# Patient Record
Sex: Male | Born: 1950
Health system: Southern US, Community
[De-identification: ages and names within clinical notes are randomized; demographics above are authoritative.]

## PROBLEM LIST (undated history)

## (undated) DIAGNOSIS — J449 Chronic obstructive pulmonary disease, unspecified: Secondary | ICD-10-CM

## (undated) DIAGNOSIS — N400 Enlarged prostate without lower urinary tract symptoms: Secondary | ICD-10-CM

## (undated) DIAGNOSIS — R0600 Dyspnea, unspecified: Secondary | ICD-10-CM

## (undated) DIAGNOSIS — I499 Cardiac arrhythmia, unspecified: Secondary | ICD-10-CM

## (undated) DIAGNOSIS — F32A Depression, unspecified: Secondary | ICD-10-CM

## (undated) DIAGNOSIS — E7800 Pure hypercholesterolemia, unspecified: Secondary | ICD-10-CM

## (undated) DIAGNOSIS — R351 Nocturia: Secondary | ICD-10-CM

## (undated) DIAGNOSIS — H9190 Unspecified hearing loss, unspecified ear: Secondary | ICD-10-CM

## (undated) DIAGNOSIS — N189 Chronic kidney disease, unspecified: Secondary | ICD-10-CM

## (undated) DIAGNOSIS — F329 Major depressive disorder, single episode, unspecified: Secondary | ICD-10-CM

## (undated) DIAGNOSIS — I10 Essential (primary) hypertension: Secondary | ICD-10-CM

## (undated) DIAGNOSIS — C44310 Basal cell carcinoma of skin of unspecified parts of face: Secondary | ICD-10-CM

## (undated) DIAGNOSIS — Z87442 Personal history of urinary calculi: Secondary | ICD-10-CM

## (undated) DIAGNOSIS — R0609 Other forms of dyspnea: Secondary | ICD-10-CM

## (undated) DIAGNOSIS — M1990 Unspecified osteoarthritis, unspecified site: Secondary | ICD-10-CM

## (undated) DIAGNOSIS — E119 Type 2 diabetes mellitus without complications: Secondary | ICD-10-CM

## (undated) DIAGNOSIS — Z8701 Personal history of pneumonia (recurrent): Secondary | ICD-10-CM

## (undated) HISTORY — PX: COLONOSCOPY: SHX174

## (undated) HISTORY — DX: Major depressive disorder, single episode, unspecified: F32.9

## (undated) HISTORY — PX: BASAL CELL CARCINOMA EXCISION: SHX1214

## (undated) HISTORY — DX: Depression, unspecified: F32.A

## (undated) HISTORY — DX: Dyspnea, unspecified: R06.00

## (undated) HISTORY — DX: Pure hypercholesterolemia, unspecified: E78.00

## (undated) HISTORY — DX: Other forms of dyspnea: R06.09

---

## 2004-05-13 ENCOUNTER — Observation Stay (HOSPITAL_COMMUNITY): Admission: EM | Admit: 2004-05-13 | Discharge: 2004-05-14 | Payer: Self-pay | Admitting: Emergency Medicine

## 2004-05-14 HISTORY — PX: CARDIAC CATHETERIZATION: SHX172

## 2012-05-31 HISTORY — PX: BACK SURGERY: SHX140

## 2012-11-21 ENCOUNTER — Other Ambulatory Visit: Payer: Self-pay | Admitting: Neurosurgery

## 2012-12-12 ENCOUNTER — Encounter (HOSPITAL_COMMUNITY): Payer: Self-pay | Admitting: Pharmacy Technician

## 2012-12-13 ENCOUNTER — Encounter (HOSPITAL_COMMUNITY)
Admission: RE | Admit: 2012-12-13 | Discharge: 2012-12-13 | Disposition: A | Discharge status: Home / Self Care | Payer: BC Managed Care – PPO | Source: Ambulatory Visit | Attending: Neurosurgery | Admitting: Neurosurgery

## 2012-12-13 ENCOUNTER — Encounter (HOSPITAL_COMMUNITY): Payer: Self-pay

## 2012-12-13 ENCOUNTER — Encounter (HOSPITAL_COMMUNITY)
Admission: RE | Admit: 2012-12-13 | Discharge: 2012-12-13 | Disposition: A | Discharge status: Home / Self Care | Payer: BC Managed Care – PPO | Source: Ambulatory Visit | Attending: Anesthesiology | Admitting: Anesthesiology

## 2012-12-13 DIAGNOSIS — Z01811 Encounter for preprocedural respiratory examination: Secondary | ICD-10-CM | POA: Insufficient documentation

## 2012-12-13 DIAGNOSIS — Z01812 Encounter for preprocedural laboratory examination: Secondary | ICD-10-CM | POA: Insufficient documentation

## 2012-12-13 DIAGNOSIS — Z01818 Encounter for other preprocedural examination: Secondary | ICD-10-CM | POA: Insufficient documentation

## 2012-12-13 DIAGNOSIS — Z01810 Encounter for preprocedural cardiovascular examination: Secondary | ICD-10-CM | POA: Insufficient documentation

## 2012-12-13 HISTORY — DX: Type 2 diabetes mellitus without complications: E11.9

## 2012-12-13 HISTORY — DX: Chronic obstructive pulmonary disease, unspecified: J44.9

## 2012-12-13 HISTORY — DX: Essential (primary) hypertension: I10

## 2012-12-13 LAB — CBC
HCT: 43.2 % (ref 39.0–52.0)
Hemoglobin: 14.4 g/dL (ref 13.0–17.0)
MCH: 29 pg (ref 26.0–34.0)
MCHC: 33.3 g/dL (ref 30.0–36.0)
MCV: 87.1 fL (ref 78.0–100.0)
RBC: 4.96 MIL/uL (ref 4.22–5.81)
WBC: 9.6 10*3/uL (ref 4.0–10.5)

## 2012-12-13 LAB — TYPE AND SCREEN
ABO/RH(D): O POS
Antibody Screen: NEGATIVE

## 2012-12-13 LAB — BASIC METABOLIC PANEL
BUN: 18 mg/dL (ref 6–23)
CO2: 27 mEq/L (ref 19–32)
Calcium: 9.6 mg/dL (ref 8.4–10.5)
Chloride: 104 mEq/L (ref 96–112)
GFR calc Af Amer: >90 mL/min (ref >90)
GFR calc non Af Amer: 87 mL/min — ABNORMAL LOW (ref >90)
Glucose, Bld: 94 mg/dL (ref 70–99)
Potassium: 4.5 mEq/L (ref 3.5–5.1)
Sodium: 141 mEq/L (ref 135–145)

## 2012-12-13 LAB — SURGICAL PCR SCREEN: Staphylococcus aureus: NEGATIVE

## 2012-12-13 LAB — ABO/RH: ABO/RH(D): O POS

## 2012-12-13 NOTE — Pre-Procedure Instructions (Signed)
Chris Washington  12/13/2012   Your procedure is scheduled on:  December 25, 2012  Report to [facility] at 12 PM.  Call this number if you have problems the morning of surgery: [phone]   Remember:  DISCONTINUE ASPIRIN, COUMADIN, EFFIENT, PLAVIX, HERBAL MEDICATIONS 7 DAYS PRIOR TO SURGERY   Do not eat food or drink liquids after midnight.   Take these medicines the morning of surgery with A SIP OF WATER: amlodipine(norvasc)   Do not wear jewelry, make-up or nail polish.  Do not wear lotions, powders, or perfumes. You may wear deodorant.  Do not shave 48 hours prior to surgery. Men may shave face and neck.  Do not bring valuables to the hospital.  [facility] is not responsible for any belongings or valuables.  Contacts, dentures or bridgework may not be worn into surgery.  Leave suitcase in the car. After surgery it may be brought to your room.  For patients admitted to the hospital, checkout time is 11:00 AM the day of  discharge.   Patients discharged the day of surgery will not be allowed to drive  home.  Name and phone number of your driver:   Special Instructions: Shower using CHG 2 nights before surgery and the night before surgery.  If you shower the day of surgery use CHG.  Use special wash - you have one bottle of CHG for all showers.  You should use approximately 1/3 of the bottle for each shower.   Please read over the following fact sheets that you were given: Pain Booklet, Coughing and Deep Breathing, Blood Transfusion Information and Surgical Site Infection Prevention

## 2012-12-14 ENCOUNTER — Encounter (HOSPITAL_COMMUNITY): Payer: Self-pay

## 2012-12-14 NOTE — Progress Notes (Signed)
Anesthesia chart review:  Patient is a 62 year old male scheduled for L3-4, L4-5 , L5-S1 laminectomies with L4-5 PLIF on 12/25/12 by Dr. Lovell Sheehan.  History includes former smoker, HTN, DM2, COPD.  Cardiac cath on 05/14/04 showed normal coronaries, normal LV systolic function, normal renals, and normal abdominal aorta.  Echo on 05/13/04 showed normal LV systolic function, EF 55-65%, no regional wall motion abnormalities, mildly dilated RV, trivial MR/TR.  (Copies of cath and echo found in E-chart, not Epic.)  EKG on 12/13/12 showed SB @ 54 bpm, anteroseptal infarct (age undetermined).  It was not felt significantly changed since a prior EKG on 05/13/04 (see Muse).  Preoperative CXR and labs noted.  His EKG has been stable since 2005.  No CV symptoms documented at his PAT visit.  If he remains asymptomatic from a CV standpoint then I would anticipate that he could proceed as planned.  Velna Ochs [facility]/Anesthesiology Phone [phone] 12/14/2012 1:17 PM

## 2012-12-24 MED ORDER — CEFAZOLIN SODIUM-DEXTROSE 2-3 GM-% IV SOLR
2.0000 g | INTRAVENOUS | Status: AC
  Administered 2012-12-25: 2 g via INTRAVENOUS
  Filled 2012-12-24: qty 50

## 2012-12-25 ENCOUNTER — Inpatient Hospital Stay (HOSPITAL_COMMUNITY)
Admission: RE | Admit: 2012-12-25 | Discharge: 2012-12-29 | DRG: 755 | Disposition: A | Discharge status: Home / Self Care | Payer: BC Managed Care – PPO | Source: Ambulatory Visit | Attending: Neurosurgery | Admitting: Neurosurgery

## 2012-12-25 ENCOUNTER — Encounter (HOSPITAL_COMMUNITY): Payer: Self-pay | Admitting: Vascular Surgery

## 2012-12-25 ENCOUNTER — Encounter (HOSPITAL_COMMUNITY): Payer: Self-pay | Admitting: *Deleted

## 2012-12-25 ENCOUNTER — Encounter (HOSPITAL_COMMUNITY)
Admission: RE | Disposition: A | Discharge status: Home / Self Care | Payer: Self-pay | Source: Ambulatory Visit | Attending: Neurosurgery

## 2012-12-25 ENCOUNTER — Inpatient Hospital Stay (HOSPITAL_COMMUNITY): Payer: BC Managed Care – PPO | Admitting: Certified Registered"

## 2012-12-25 ENCOUNTER — Inpatient Hospital Stay (HOSPITAL_COMMUNITY): Payer: BC Managed Care – PPO

## 2012-12-25 DIAGNOSIS — I10 Essential (primary) hypertension: Secondary | ICD-10-CM | POA: Diagnosis present

## 2012-12-25 DIAGNOSIS — M48062 Spinal stenosis, lumbar region with neurogenic claudication: Principal | ICD-10-CM | POA: Diagnosis present

## 2012-12-25 DIAGNOSIS — J4489 Other specified chronic obstructive pulmonary disease: Secondary | ICD-10-CM | POA: Diagnosis present

## 2012-12-25 DIAGNOSIS — M4316 Spondylolisthesis, lumbar region: Secondary | ICD-10-CM

## 2012-12-25 DIAGNOSIS — E119 Type 2 diabetes mellitus without complications: Secondary | ICD-10-CM | POA: Diagnosis present

## 2012-12-25 DIAGNOSIS — J449 Chronic obstructive pulmonary disease, unspecified: Secondary | ICD-10-CM | POA: Diagnosis present

## 2012-12-25 DIAGNOSIS — R339 Retention of urine, unspecified: Secondary | ICD-10-CM | POA: Diagnosis not present

## 2012-12-25 DIAGNOSIS — Z87891 Personal history of nicotine dependence: Secondary | ICD-10-CM

## 2012-12-25 LAB — GLUCOSE, CAPILLARY
Glucose-Capillary: 92 mg/dL (ref 70–99)
Glucose-Capillary: 161 mg/dL — ABNORMAL HIGH (ref 70–99)

## 2012-12-25 SURGERY — POSTERIOR LUMBAR FUSION 1 LEVEL
Anesthesia: General | Wound class: Clean

## 2012-12-25 MED ORDER — ONDANSETRON HCL 4 MG/2ML IJ SOLN
INTRAMUSCULAR | Status: DC | PRN
  Administered 2012-12-25: 4 mg via INTRAVENOUS

## 2012-12-25 MED ORDER — LIDOCAINE HCL (CARDIAC) 20 MG/ML IV SOLN
INTRAVENOUS | Status: DC | PRN
  Administered 2012-12-25: 80 mg via INTRAVENOUS

## 2012-12-25 MED ORDER — NEOSTIGMINE METHYLSULFATE 1 MG/ML IJ SOLN
INTRAMUSCULAR | Status: DC | PRN
  Administered 2012-12-25: 4 mg via INTRAVENOUS

## 2012-12-25 MED ORDER — PHENOL 1.4 % MT LIQD: 1.0000 | OROMUCOSAL | Status: DC | PRN

## 2012-12-25 MED ORDER — SODIUM CHLORIDE 0.9 % IV SOLN
INTRAVENOUS | Status: AC
  Filled 2012-12-25: qty 500

## 2012-12-25 MED ORDER — INSULIN ASPART 100 UNIT/ML ~~LOC~~ SOLN
0.0000 [IU] | SUBCUTANEOUS | Status: DC
  Administered 2012-12-25: 4 [IU] via SUBCUTANEOUS
  Administered 2012-12-26: 1 [IU] via SUBCUTANEOUS
  Administered 2012-12-26 (×2): 3 [IU] via SUBCUTANEOUS
  Administered 2012-12-26 – 2012-12-27 (×2): 4 [IU] via SUBCUTANEOUS
  Administered 2012-12-27: 3 [IU] via SUBCUTANEOUS
  Administered 2012-12-27: 4 [IU] via SUBCUTANEOUS
  Administered 2012-12-27 – 2012-12-28 (×4): 3 [IU] via SUBCUTANEOUS
  Administered 2012-12-28 – 2012-12-29 (×4): 4 [IU] via SUBCUTANEOUS

## 2012-12-25 MED ORDER — LACTATED RINGERS IV SOLN
INTRAVENOUS | Status: DC | PRN
  Administered 2012-12-25 (×2): via INTRAVENOUS

## 2012-12-25 MED ORDER — DIPHENHYDRAMINE HCL 12.5 MG/5ML PO ELIX: 12.5000 mg | ORAL_SOLUTION | Freq: Four times a day (QID) | ORAL | Status: DC | PRN

## 2012-12-25 MED ORDER — OXYCODONE-ACETAMINOPHEN 5-325 MG PO TABS
1.0000 | ORAL_TABLET | ORAL | Status: DC | PRN
  Administered 2012-12-27 – 2012-12-29 (×8): 2 via ORAL
  Filled 2012-12-25 (×9): qty 2

## 2012-12-25 MED ORDER — MORPHINE SULFATE (PF) 1 MG/ML IV SOLN
INTRAVENOUS | Status: AC
  Filled 2012-12-25: qty 25

## 2012-12-25 MED ORDER — MENTHOL 3 MG MT LOZG: 1.0000 | LOZENGE | OROMUCOSAL | Status: DC | PRN

## 2012-12-25 MED ORDER — ONDANSETRON HCL 4 MG/2ML IJ SOLN
4.0000 mg | Freq: Four times a day (QID) | INTRAMUSCULAR | Status: DC | PRN
  Administered 2012-12-25: 4 mg via INTRAVENOUS

## 2012-12-25 MED ORDER — MORPHINE SULFATE (PF) 1 MG/ML IV SOLN
INTRAVENOUS | Status: DC
  Administered 2012-12-25 – 2012-12-26 (×2): 4.5 mg via INTRAVENOUS
  Administered 2012-12-26: 6 mg via INTRAVENOUS

## 2012-12-25 MED ORDER — ONDANSETRON HCL 4 MG/2ML IJ SOLN
4.0000 mg | INTRAMUSCULAR | Status: DC | PRN
  Administered 2012-12-26 – 2012-12-28 (×2): 4 mg via INTRAVENOUS
  Filled 2012-12-25 (×2): qty 2

## 2012-12-25 MED ORDER — LISINOPRIL 20 MG PO TABS
20.0000 mg | ORAL_TABLET | Freq: Every day | ORAL | Status: DC
  Administered 2012-12-26 – 2012-12-29 (×4): 20 mg via ORAL
  Filled 2012-12-25 (×4): qty 1

## 2012-12-25 MED ORDER — FENTANYL CITRATE 0.05 MG/ML IJ SOLN
INTRAMUSCULAR | Status: DC | PRN
  Administered 2012-12-25: 100 ug via INTRAVENOUS
  Administered 2012-12-25 (×4): 50 ug via INTRAVENOUS
  Administered 2012-12-25: 100 ug via INTRAVENOUS
  Administered 2012-12-25 (×2): 50 ug via INTRAVENOUS

## 2012-12-25 MED ORDER — METFORMIN HCL ER 500 MG PO TB24
1000.0000 mg | ORAL_TABLET | Freq: Every evening | ORAL | Status: DC
  Administered 2012-12-25 – 2012-12-28 (×4): 1000 mg via ORAL
  Filled 2012-12-25 (×5): qty 2

## 2012-12-25 MED ORDER — LACTATED RINGERS IV SOLN
INTRAVENOUS | Status: DC
  Administered 2012-12-25 – 2012-12-27 (×2): via INTRAVENOUS

## 2012-12-25 MED ORDER — MORPHINE SULFATE 2 MG/ML IJ SOLN: 1.0000 mg | INTRAMUSCULAR | Status: DC | PRN

## 2012-12-25 MED ORDER — HYDROCODONE-ACETAMINOPHEN 5-325 MG PO TABS: 1.0000 | ORAL_TABLET | ORAL | Status: DC | PRN

## 2012-12-25 MED ORDER — SODIUM CHLORIDE 0.9 % IR SOLN
Status: DC | PRN
  Administered 2012-12-25: 13:00:00

## 2012-12-25 MED ORDER — CEFAZOLIN SODIUM-DEXTROSE 2-3 GM-% IV SOLR
2.0000 g | Freq: Three times a day (TID) | INTRAVENOUS | Status: AC
  Administered 2012-12-25 – 2012-12-26 (×2): 2 g via INTRAVENOUS
  Filled 2012-12-25 (×2): qty 50

## 2012-12-25 MED ORDER — BACITRACIN ZINC 500 UNIT/GM EX OINT
TOPICAL_OINTMENT | CUTANEOUS | Status: DC | PRN
  Administered 2012-12-25: 1 via TOPICAL

## 2012-12-25 MED ORDER — ACETAMINOPHEN 325 MG PO TABS
650.0000 mg | ORAL_TABLET | ORAL | Status: DC | PRN
  Administered 2012-12-27: 650 mg via ORAL
  Filled 2012-12-25: qty 2

## 2012-12-25 MED ORDER — BUDESONIDE-FORMOTEROL FUMARATE 160-4.5 MCG/ACT IN AERO
2.0000 | INHALATION_SPRAY | Freq: Two times a day (BID) | RESPIRATORY_TRACT | Status: DC | PRN
  Filled 2012-12-25 (×2): qty 6

## 2012-12-25 MED ORDER — NALOXONE HCL 0.4 MG/ML IJ SOLN: 0.4000 mg | INTRAMUSCULAR | Status: DC | PRN

## 2012-12-25 MED ORDER — BUPIVACAINE LIPOSOME 1.3 % IJ SUSP
INTRAMUSCULAR | Status: DC | PRN
  Administered 2012-12-25: 20 mL

## 2012-12-25 MED ORDER — ROCURONIUM BROMIDE 100 MG/10ML IV SOLN
INTRAVENOUS | Status: DC | PRN
  Administered 2012-12-25: 50 mg via INTRAVENOUS
  Administered 2012-12-25 (×4): 10 mg via INTRAVENOUS

## 2012-12-25 MED ORDER — GLYCOPYRROLATE 0.2 MG/ML IJ SOLN
INTRAMUSCULAR | Status: DC | PRN
  Administered 2012-12-25: 0.6 mg via INTRAVENOUS
  Administered 2012-12-25: 0.1 mg via INTRAVENOUS

## 2012-12-25 MED ORDER — HYDROMORPHONE HCL PF 1 MG/ML IJ SOLN
INTRAMUSCULAR | Status: AC
  Filled 2012-12-25: qty 1

## 2012-12-25 MED ORDER — SODIUM CHLORIDE 0.9 % IJ SOLN: 9.0000 mL | INTRAMUSCULAR | Status: DC | PRN

## 2012-12-25 MED ORDER — DEXAMETHASONE SODIUM PHOSPHATE 10 MG/ML IJ SOLN
INTRAMUSCULAR | Status: DC | PRN
  Administered 2012-12-25: 10 mg via INTRAVENOUS

## 2012-12-25 MED ORDER — BACITRACIN 50000 UNITS IM SOLR
INTRAMUSCULAR | Status: AC
  Filled 2012-12-25: qty 1

## 2012-12-25 MED ORDER — HYDROMORPHONE HCL PF 1 MG/ML IJ SOLN
INTRAMUSCULAR | Status: AC
  Administered 2012-12-25: 0.5 mg via INTRAVENOUS
  Filled 2012-12-25: qty 1

## 2012-12-25 MED ORDER — MIDAZOLAM HCL 5 MG/5ML IJ SOLN
INTRAMUSCULAR | Status: DC | PRN
  Administered 2012-12-25: 2 mg via INTRAVENOUS

## 2012-12-25 MED ORDER — OXYCODONE HCL 5 MG/5ML PO SOLN: 5.0000 mg | Freq: Once | ORAL | Status: DC | PRN

## 2012-12-25 MED ORDER — AMLODIPINE BESYLATE 5 MG PO TABS
5.0000 mg | ORAL_TABLET | Freq: Every day | ORAL | Status: DC
  Administered 2012-12-26 – 2012-12-29 (×4): 5 mg via ORAL
  Filled 2012-12-25 (×4): qty 1

## 2012-12-25 MED ORDER — BUPIVACAINE-EPINEPHRINE PF 0.5-1:200000 % IJ SOLN
INTRAMUSCULAR | Status: DC | PRN
  Administered 2012-12-25: 10 mL

## 2012-12-25 MED ORDER — THROMBIN 20000 UNITS EX SOLR
CUTANEOUS | Status: DC | PRN
  Administered 2012-12-25: 13:00:00 via TOPICAL

## 2012-12-25 MED ORDER — DIPHENHYDRAMINE HCL 50 MG/ML IJ SOLN: 12.5000 mg | Freq: Four times a day (QID) | INTRAMUSCULAR | Status: DC | PRN

## 2012-12-25 MED ORDER — OXYCODONE HCL 5 MG PO TABS: 5.0000 mg | ORAL_TABLET | Freq: Once | ORAL | Status: DC | PRN

## 2012-12-25 MED ORDER — PROMETHAZINE HCL 25 MG/ML IJ SOLN
INTRAMUSCULAR | Status: AC
  Administered 2012-12-25: 6.25 mg via INTRAVENOUS
  Filled 2012-12-25: qty 1

## 2012-12-25 MED ORDER — ACETAMINOPHEN 650 MG RE SUPP: 650.0000 mg | RECTAL | Status: DC | PRN

## 2012-12-25 MED ORDER — PHENYLEPHRINE HCL 10 MG/ML IJ SOLN
10.0000 mg | INTRAVENOUS | Status: DC | PRN
  Administered 2012-12-25: 25 ug/min via INTRAVENOUS

## 2012-12-25 MED ORDER — DIAZEPAM 5 MG PO TABS
5.0000 mg | ORAL_TABLET | Freq: Four times a day (QID) | ORAL | Status: DC | PRN
  Administered 2012-12-27: 5 mg via ORAL
  Filled 2012-12-25: qty 1

## 2012-12-25 MED ORDER — PROMETHAZINE HCL 25 MG/ML IJ SOLN: 6.2500 mg | INTRAMUSCULAR | Status: DC | PRN

## 2012-12-25 MED ORDER — ONDANSETRON HCL 4 MG/2ML IJ SOLN
INTRAMUSCULAR | Status: AC
  Filled 2012-12-25: qty 2

## 2012-12-25 MED ORDER — DOCUSATE SODIUM 100 MG PO CAPS
100.0000 mg | ORAL_CAPSULE | Freq: Two times a day (BID) | ORAL | Status: DC
  Administered 2012-12-25 – 2012-12-29 (×8): 100 mg via ORAL
  Filled 2012-12-25 (×5): qty 1

## 2012-12-25 MED ORDER — ALUM & MAG HYDROXIDE-SIMETH 200-200-20 MG/5ML PO SUSP
30.0000 mL | Freq: Four times a day (QID) | ORAL | Status: DC | PRN
  Administered 2012-12-25 – 2012-12-28 (×4): 30 mL via ORAL
  Filled 2012-12-25 (×4): qty 30

## 2012-12-25 MED ORDER — 0.9 % SODIUM CHLORIDE (POUR BTL) OPTIME
TOPICAL | Status: DC | PRN
  Administered 2012-12-25: 1000 mL

## 2012-12-25 MED ORDER — BUPIVACAINE LIPOSOME 1.3 % IJ SUSP
20.0000 mL | Freq: Once | INTRAMUSCULAR | Status: DC
  Filled 2012-12-25: qty 20

## 2012-12-25 MED ORDER — LACTATED RINGERS IV SOLN
INTRAVENOUS | Status: DC
  Administered 2012-12-25: 08:00:00 via INTRAVENOUS

## 2012-12-25 MED ORDER — PROPOFOL 10 MG/ML IV BOLUS
INTRAVENOUS | Status: DC | PRN
  Administered 2012-12-25: 200 mg via INTRAVENOUS

## 2012-12-25 MED ORDER — HYDROMORPHONE HCL PF 1 MG/ML IJ SOLN
0.2500 mg | INTRAMUSCULAR | Status: DC | PRN
  Administered 2012-12-25 (×2): 0.5 mg via INTRAVENOUS

## 2012-12-25 MED ORDER — ARTIFICIAL TEARS OP OINT
TOPICAL_OINTMENT | OPHTHALMIC | Status: DC | PRN
  Administered 2012-12-25: 1 via OPHTHALMIC

## 2012-12-25 SURGICAL SUPPLY — 67 items
BAG DECANTER FOR FLEXI CONT (MISCELLANEOUS) ×2 IMPLANT
BENZOIN TINCTURE PRP APPL 2/3 (GAUZE/BANDAGES/DRESSINGS) ×2 IMPLANT
BLADE SURG ROTATE 9660 (MISCELLANEOUS) IMPLANT
BRUSH SCRUB EZ PLAIN DRY (MISCELLANEOUS) ×2 IMPLANT
BUR ACORN 6.0 (BURR) ×2 IMPLANT
BUR MATCHSTICK NEURO 3.0 LAGG (BURR) ×2 IMPLANT
CANISTER SUCTION 2500CC (MISCELLANEOUS) ×2 IMPLANT
CAP REVERE LOCKING (Cap) ×8 IMPLANT
CLOTH BEACON ORANGE TIMEOUT ST (SAFETY) ×2 IMPLANT
CONT SPEC 4OZ CLIKSEAL STRL BL (MISCELLANEOUS) ×2 IMPLANT
COVER BACK TABLE 24X17X13 BIG (DRAPES) IMPLANT
COVER TABLE BACK 60X90 (DRAPES) ×2 IMPLANT
DRAPE C-ARM 42X72 X-RAY (DRAPES) ×6 IMPLANT
DRAPE LAPAROTOMY 100X72X124 (DRAPES) ×2 IMPLANT
DRAPE POUCH INSTRU U-SHP 10X18 (DRAPES) ×2 IMPLANT
DRAPE PROXIMA HALF (DRAPES) ×2 IMPLANT
DRAPE SURG 17X23 STRL (DRAPES) ×8 IMPLANT
ELECT BLADE 4.0 EZ CLEAN MEGAD (MISCELLANEOUS) ×2
ELECT REM PT RETURN 9FT ADLT (ELECTROSURGICAL) ×2
ELECTRODE BLDE 4.0 EZ CLN MEGD (MISCELLANEOUS) ×1 IMPLANT
ELECTRODE REM PT RTRN 9FT ADLT (ELECTROSURGICAL) ×1 IMPLANT
EVACUATOR 1/8 PVC DRAIN (DRAIN) ×2 IMPLANT
GAUZE SPONGE 4X4 16PLY XRAY LF (GAUZE/BANDAGES/DRESSINGS) ×2 IMPLANT
GLOVE BIO SURGEON STRL SZ 6.5 (GLOVE) ×4 IMPLANT
GLOVE BIO SURGEON STRL SZ8.5 (GLOVE) ×4 IMPLANT
GLOVE BIOGEL PI IND STRL 6.5 (GLOVE) ×1 IMPLANT
GLOVE BIOGEL PI INDICATOR 6.5 (GLOVE) ×1
GLOVE ECLIPSE 8.5 STRL (GLOVE) ×2 IMPLANT
GLOVE EXAM NITRILE LRG STRL (GLOVE) IMPLANT
GLOVE EXAM NITRILE MD LF STRL (GLOVE) ×4 IMPLANT
GLOVE EXAM NITRILE XL STR (GLOVE) IMPLANT
GLOVE EXAM NITRILE XS STR PU (GLOVE) IMPLANT
GLOVE SS BIOGEL STRL SZ 8 (GLOVE) ×2 IMPLANT
GLOVE SUPERSENSE BIOGEL SZ 8 (GLOVE) ×2
GOWN BRE IMP SLV AUR LG STRL (GOWN DISPOSABLE) IMPLANT
GOWN BRE IMP SLV AUR XL STRL (GOWN DISPOSABLE) ×6 IMPLANT
GOWN STRL REIN 2XL LVL4 (GOWN DISPOSABLE) ×2 IMPLANT
KIT BASIN OR (CUSTOM PROCEDURE TRAY) ×2 IMPLANT
KIT ROOM TURNOVER OR (KITS) ×2 IMPLANT
MILL MEDIUM DISP (BLADE) ×2 IMPLANT
NEEDLE HYPO 21X1.5 SAFETY (NEEDLE) ×2 IMPLANT
NEEDLE HYPO 22GX1.5 SAFETY (NEEDLE) ×2 IMPLANT
NS IRRIG 1000ML POUR BTL (IV SOLUTION) ×2 IMPLANT
PACK FOAM VITOSS 10CC (Orthopedic Implant) ×2 IMPLANT
PACK LAMINECTOMY NEURO (CUSTOM PROCEDURE TRAY) ×2 IMPLANT
PAD ARMBOARD 7.5X6 YLW CONV (MISCELLANEOUS) ×6 IMPLANT
PATTIES SURGICAL .5 X1 (DISPOSABLE) IMPLANT
PUTTY 10ML ACTIFUSE ABX (Putty) ×2 IMPLANT
ROD REVERE 6.35 45MM (Rod) ×4 IMPLANT
SCREW REVERE 6.35 75X55MM (Screw) ×8 IMPLANT
SLEEVE SURGEON STRL (DRAPES) ×2 IMPLANT
SPACER SUSTAIN O 10X26 13MM (Spacer) ×4 IMPLANT
SPONGE GAUZE 4X4 12PLY (GAUZE/BANDAGES/DRESSINGS) ×2 IMPLANT
SPONGE LAP 4X18 X RAY DECT (DISPOSABLE) IMPLANT
SPONGE NEURO XRAY DETECT 1X3 (DISPOSABLE) IMPLANT
SPONGE SURGIFOAM ABS GEL 100 (HEMOSTASIS) ×2 IMPLANT
STRIP CLOSURE SKIN 1/2X4 (GAUZE/BANDAGES/DRESSINGS) ×2 IMPLANT
SUT VIC AB 1 CT1 18XBRD ANBCTR (SUTURE) ×3 IMPLANT
SUT VIC AB 1 CT1 8-18 (SUTURE) ×3
SUT VIC AB 2-0 CP2 18 (SUTURE) ×4 IMPLANT
SYR 20CC LL (SYRINGE) ×2 IMPLANT
SYR 20ML ECCENTRIC (SYRINGE) ×2 IMPLANT
TAPE CLOTH SURG 4X10 WHT LF (GAUZE/BANDAGES/DRESSINGS) ×2 IMPLANT
TOWEL OR 17X24 6PK STRL BLUE (TOWEL DISPOSABLE) ×2 IMPLANT
TOWEL OR 17X26 10 PK STRL BLUE (TOWEL DISPOSABLE) ×2 IMPLANT
TRAY FOLEY CATH 14FRSI W/METER (CATHETERS) ×2 IMPLANT
WATER STERILE IRR 1000ML POUR (IV SOLUTION) ×2 IMPLANT

## 2012-12-25 NOTE — H&P (Signed)
Subjective: The patient is a 62 year old white male who has complained of back and bilateral leg pain. He has failed medical management and was worked up with a lumbar MRI. This demonstrated multilevel spinal stenosis as well as a mobile spondylolisthesis at L4-5. I discussed the various treatment options with the patient including surgery. He has weighed the risks, benefits, and alternatives surgery and decided proceed with a lumbar decompression instrumentation and fusion.   Past Medical History  Diagnosis Date  . Hypertension   . Diabetes mellitus without complication   . COPD (chronic obstructive pulmonary disease)     Past Surgical History  Procedure Laterality Date  . Cardiac catheterization  05/14/04    normal coronaries, LV systolic function, renals, abdominal aorta (Dr. Chanda Busing, [facility])    No Known Allergies  History  Substance Use Topics  . Smoking status: Former Smoker    Quit date: 05/31/2004  . Smokeless tobacco: Not on file  . Alcohol Use: No    History reviewed. No pertinent family history. Prior to Admission medications   Medication Sig Start Date End Date Taking? Authorizing Provider  amLODipine (NORVASC) 5 MG tablet Take 5 mg by mouth daily.   Yes Historical Provider, MD  budesonide-formoterol (SYMBICORT) 160-4.5 MCG/ACT inhaler Inhale 2 puffs into the lungs 2 (two) times daily as needed.   Yes Historical Provider, MD  calcium carbonate (TUMS - DOSED IN MG ELEMENTAL CALCIUM) 500 MG chewable tablet Chew 1 tablet by mouth 3 (three) times daily as needed for heartburn.   Yes Historical Provider, MD  etodolac (LODINE) 400 MG tablet Take 400 mg by mouth 2 (two) times daily as needed (for pain).   Yes Historical Provider, MD  lisinopril (PRINIVIL,ZESTRIL) 20 MG tablet Take 20 mg by mouth daily.   Yes Historical Provider, MD  metFORMIN (GLUCOPHAGE-XR) 500 MG 24 hr tablet Take 1,000 mg by mouth every evening.   Yes Historical Provider, MD     Review of  Systems  Positive ROS: As above  All other systems have been reviewed and were otherwise negative with the exception of those mentioned in the HPI and as above.  Objective: Vital signs in last 24 hours: Temp:  [97.3 F (36.3 C)] 97.3 F (36.3 C) (07/28 0753) Pulse Rate:  [62] 62 (07/28 0753) Resp:  [20] 20 (07/28 0753) BP: (175)/(82) 175/82 mmHg (07/28 0753) SpO2:  [98 %] 98 % (07/28 0753)  General Appearance: Alert, cooperative, no distress, appears stated age Head: Normocephalic, without obvious abnormality, atraumatic Eyes: PERRL, conjunctiva/corneas clear, EOM's intact, fundi benign, both eyes      Ears: Normal TM's and external ear canals, both ears Throat: Lips, mucosa, and tongue normal; teeth and gums normal Neck: Supple, symmetrical, trachea midline, no adenopathy; thyroid: No enlargement/tenderness/nodules; no carotid bruit or JVD Back: Symmetric, no curvature, ROM normal, no CVA tenderness Lungs: Clear to auscultation bilaterally, respirations unlabored Heart: Regular rate and rhythm, S1 and S2 normal, no murmur, rub or gallop Abdomen: Soft, non-tender, bowel sounds active all four quadrants, no masses, no organomegaly Extremities: Extremities normal, atraumatic, no cyanosis or edema Pulses: 2+ and symmetric all extremities Skin: Skin color, texture, turgor normal, no rashes or lesions  NEUROLOGIC:   Mental status: alert and oriented, no aphasia, good attention span, Fund of knowledge/ memory ok Motor Exam - grossly normal Sensory Exam - grossly normal Reflexes:  Coordination - grossly normal Gait - grossly normal Balance - grossly normal Cranial Nerves: I: smell Not tested  II: visual acuity  OS:  Normal    OD: Normal   II: visual fields Full to confrontation  II: pupils Equal, round, reactive to light  III,VII: ptosis None  III,IV,VI: extraocular muscles  Full ROM  V: mastication Normal  V: facial light touch sensation  Normal  V,VII: corneal reflex   Present  VII: facial muscle function - upper  Normal  VII: facial muscle function - lower Normal  VIII: hearing Not tested  IX: soft palate elevation  Normal  IX,X: gag reflex Present  XI: trapezius strength  5/5  XI: sternocleidomastoid strength 5/5  XI: neck flexion strength  5/5  XII: tongue strength  Normal    Data Review Lab Results  Component Value Date   WBC 9.6 12/13/2012   HGB 14.4 12/13/2012   HCT 43.2 12/13/2012   MCV 87.1 12/13/2012   PLT 182 12/13/2012   Lab Results  Component Value Date   NA 141 12/13/2012   K 4.5 12/13/2012   CL 104 12/13/2012   CO2 27 12/13/2012   BUN 18 12/13/2012   CREATININE 0.96 12/13/2012   GLUCOSE 94 12/13/2012   No results found for this basename: INR, PROTIME    Assessment/Plan: Lumbar spinal stenosis, spondylolisthesis, lumbago, lumbar radiculopathy, neurogenic claudication: I discussed the situation with the patient. I reviewed his MR scan with them and pointed out the abnormalities. We have discussed the various treatment options including surgery. I described the surgical treatment option of a L3-4, L4-5 and L5-S1 decompression with an L4-5 posterior lumbar MR fusion, interbody prosthesis, instrumentation, et Karie Soda. I described the surgery to him. I've shown him surgical models. I discussed the risks, benefits, alternatives, and likelihood of achieving our goals with surgery. I have answered all the patient's questions. He has decided to proceed with surgery.   Kaydon Creedon D 12/25/2012 11:47 AM

## 2012-12-25 NOTE — Anesthesia Procedure Notes (Signed)
Procedure Name: Intubation Date/Time: 12/25/2012 12:05 PM Performed by: Ellin Goodie Pre-anesthesia Checklist: Patient identified, Emergency Drugs available, Suction available, Patient being monitored and Timeout performed Patient Re-evaluated:Patient Re-evaluated prior to inductionOxygen Delivery Method: Circle system utilized Preoxygenation: Pre-oxygenation with 100% oxygen Intubation Type: IV induction Ventilation: Mask ventilation without difficulty Laryngoscope Size: Mac and 3 Grade View: Grade I Tube type: Oral Tube size: 7.5 mm Number of attempts: 1 Airway Equipment and Method: Stylet Placement Confirmation: ETT inserted through vocal cords under direct vision,  positive ETCO2 and breath sounds checked- equal and bilateral Secured at: 23 cm Tube secured with: Tape Dental Injury: Teeth and Oropharynx as per pre-operative assessment  Comments: Easy atraumatic induction and intubation with MAC 3 blade.  Dr. Jacklynn Bue verified placement of ETT.  Carlynn Herald, CRNA

## 2012-12-25 NOTE — Preoperative (Signed)
Beta Blockers   Reason not to administer Beta Blockers:Not Applicable 

## 2012-12-25 NOTE — Anesthesia Preprocedure Evaluation (Addendum)
Anesthesia Evaluation  Patient identified by MRN, date of birth, ID band Patient awake    Reviewed: Allergy & Precautions, H&P , NPO status , Patient's Chart, lab work & pertinent test results, reviewed documented beta blocker date and time   Airway Mallampati: I TM Distance: >3 FB     Dental  (+) Edentulous Upper, Edentulous Lower and Dental Advisory Given   Pulmonary COPD COPD inhaler,  breath sounds clear to auscultation        Cardiovascular hypertension, Pt. on medications Rhythm:Regular Rate:Normal     Neuro/Psych    GI/Hepatic negative GI ROS, Neg liver ROS,   Endo/Other  diabetes, Well Controlled, Type 2, Oral Hypoglycemic Agents  Renal/GU negative Renal ROS  negative genitourinary   Musculoskeletal negative musculoskeletal ROS (+)   Abdominal (+)  Abdomen: soft. Bowel sounds: normal.  Peds negative pediatric ROS (+)  Hematology negative hematology ROS (+)   Anesthesia Other Findings   Reproductive/Obstetrics negative OB ROS                       Anesthesia Physical Anesthesia Plan  ASA: II  Anesthesia Plan: General   Post-op Pain Management:    Induction: Intravenous  Airway Management Planned: Oral ETT  Additional Equipment:   Intra-op Plan:   Post-operative Plan: Extubation in OR  Informed Consent: I have reviewed the patients History and Physical, chart, labs and discussed the procedure including the risks, benefits and alternatives for the proposed anesthesia with the patient or authorized representative who has indicated his/her understanding and acceptance.   Dental advisory given  Plan Discussed with: CRNA and Surgeon  Anesthesia Plan Comments:         Anesthesia Quick Evaluation

## 2012-12-25 NOTE — Op Note (Signed)
Brief history: The patient is a 62 year old white male who has complained of back buttocks and leg pain consistent with neurogenic claudication. He has failed medical management and was worked up with a lumbar MRI. This demonstrated patient multilevel spinal stenosis as well as a grade 1 spondylolisthesis at L4-5. I discussed the various treatment options with the patient including surgery. The patient has weighed the risks, benefits, and alternatives surgery decided proceed with a lumbar decompression, instrumentation, and fusion.  Preoperative diagnosis: L3-4, L4-5 and L5-S1 Degenerative disc disease, spinal stenosis compressing the bilateral L3, L4, L5 and S1 nerve roots; L4-5 spondylolisthesis lumbago; lumbar radiculopathy  Postoperative diagnosis: The same  Procedure: L3, L4 and L5 laminectomy with/foraminotomies to decompress the bilateral L3, L4, L5 and S1 nerve roots(the work required to do this was in addition to the work required to do the posterior lumbar interbody fusion because of the patient's spinal stenosis, facet arthropathy. Etc. requiring a wide decompression of the nerve roots.); L4-5 posterior lumbar interbody fusion with local morselized autograft bone and Actifusebone graft extender; insertion of interbody prosthesis at L4-5 (globus peek interbody prosthesis); posterior segmental instrumentation from L4 to L5 with globus titanium pedicle screws and rods; posterior lateral arthrodesis at L4-5 with local morselized autograft bone and Vitoss bone graft extender.  Surgeon: Dr. Delma Officer  Asst.: Dr. Mardelle Matte pool  Anesthesia: Gen. endotracheal  Estimated blood loss: 200 cc  Drains: One medium Hemovac  Complications: None  Description of procedure: The patient was brought to the operating room by the anesthesia team. General endotracheal anesthesia was induced. The patient was turned to the prone position on the Wilson frame. The patient's lumbosacral region was then prepared with  Betadine scrub and Betadine solution. Sterile drapes were applied.  I then injected the area to be incised with Marcaine with epinephrine solution. I then used the scalpel to make a linear midline incision over the L3-4, L4-5 and L5-S1 interspace. I then used electrocautery to perform a bilateral subperiosteal dissection exposing the spinous process and lamina of L2-S1. We then obtained intraoperative radiograph to confirm our location. We then inserted the Verstrac retractor to provide exposure. I then incised the interspinous ligament at L2-3, L3-4, L4-5 and L5-S1. I used Leksell nodule were to remove the spinous process of L3, L4 and L5.  I began the decompression by using the high speed drill to perform bilateral laminotomies at L3, L4 and L5. I completed the L3, L4 and L5 laminectomy with a Kerrison punches removed the ligamentum flavum at L2-3, L3-4, L4-5 and L5-S1. We used the Kerrison punches to remove the medial facets at L3-4, L4-5 and L5-S1. We performed wide foraminotomies about the bilateral L3, L4, L5 and S1 nerve roots completing the decompression.  We now turned our attention to the posterior lumbar interbody fusion. I used a scalpel to incise the intervertebral disc at L4-5 bilaterally. I then performed a partial intervertebral discectomy at L4-5 bilaterally using the pituitary forceps. We prepared the vertebral endplates at L4-5 bilaterally for the fusion by removing the soft tissues with the curettes. We then used the trial spacers to pick the appropriate sized interbody prosthesis. We prefilled his prosthesis with a combination of local morselized autograft bone that we obtained during the decompression as well as Actifuse bone graft extender. We inserted the prefilled prosthesis into the interspace at L4-5. There was a good snug fit of the prosthesis in the interspace. We then filled and the remainder of the intervertebral disc space with local morselized autograft  bone and Actifuse. This  completed the posterior lumbar interbody arthrodesis.  We now turned attention to the instrumentation. Under fluoroscopic guidance we cannulated the bilateral L4 and L5 pedicles with the bone probe. We then removed the bone probe. We then tapped the pedicle with a 0.5 millimeter tap. We then removed the tap. We probed inside the tapped pedicle with a ball probe to rule out cortical breaches. We then inserted a 7.5 x 55 millimeter pedicle screw into the L4 and L5 pedicles bilaterally under fluoroscopic guidance. We then palpated along the medial aspect of the pedicles to rule out cortical breaches. There were none. The nerve roots were not injured. We then connected the unilateral pedicle screws with a lordotic rod. We compressed the construct and secured the rod in place with the caps. We then tightened the caps appropriately. This completed the instrumentation from L4-5.  We now turned our attention to the posterior lateral arthrodesis at L4-5. We used the high-speed drill to decorticate the remainder of the facets, pars, transverse process at L4-5. We then applied a combination of local morselized autograft bone and Vitoss bone graft extender over these decorticated posterior lateral structures. This completed the posterior lateral arthrodesis.  We then obtained hemostasis using bipolar electrocautery. We irrigated the wound out with bacitracin solution. We inspected the thecal sac and nerve roots and noted they were well decompressed. We then removed the retractor. We placed a medium Hemovac drain in the epidural space and tunneled out through separate stab wound. We reapproximated patient's thoracolumbar fascia with interrupted #1 Vicryl suture. We reapproximated patient's subcutaneous tissue with interrupted 2-0 Vicryl suture. The reapproximated patient's skin with Steri-Strips and benzoin. The wound was then coated with bacitracin ointment. A sterile dressing was applied. The drapes were removed. The  patient was subsequently returned to the supine position where they were extubated by the anesthesia team. He was then transported to the post anesthesia care unit in stable condition. All sponge instrument and needle counts were reportedly correct at the end of this case.

## 2012-12-25 NOTE — Transfer of Care (Signed)
Immediate Anesthesia Transfer of Care Note  Patient: Chris Washington  Procedure(s) Performed: Procedure(s) with comments: POSTERIOR LUMBAR FUSION 1 LEVEL (N/A) - Lumbar Three-Four Lumbar Four-Five Lumbar Five-Sacral One Laminectomies with Lumbar Four-Five Posterior lumbar interbody fusion with interbody prothesis posterolateral arthrodesis and posterior nonsegmental instrumentation  Patient Location: PACU  Anesthesia Type:General  Level of Consciousness: awake, alert  and oriented  Airway & Oxygen Therapy: Patient connected to face mask oxygen  Post-op Assessment: Report given to PACU RN  Post vital signs: stable  Complications: No apparent anesthesia complications

## 2012-12-25 NOTE — Progress Notes (Signed)
Patient received from PACU.  Drowsy from surgery but oriented x4.  Moves all extremeties.  Vitals obtained.  Dressing clean, dry, intact with hemovac in place.  Will continue to monitor.

## 2012-12-25 NOTE — Progress Notes (Signed)
Patient ID: Chris Washington, male   DOB: 11/20/1950, 62 y.o.   MRN: [id number] Subjective:  The patient is somnolent but easily arousable. He looks well. He is in no apparent distress.  Objective: Vital signs in last 24 hours: Temp:  [97.3 F (36.3 C)-97.7 F (36.5 C)] 97.7 F (36.5 C) (07/28 1600) Pulse Rate:  [62-83] 83 (07/28 1600) Resp:  [17-20] 17 (07/28 1600) BP: (152-175)/(82-95) 152/95 mmHg (07/28 1600) SpO2:  [98 %] 98 % (07/28 1600)  Intake/Output from previous day:   Intake/Output this shift: Total I/O In: 1500 [I.V.:1500] Out: 370 [Urine:120; Blood:250]  Physical exam patient is Glasgow Coma Scale 14. He is easily arousable. He is moving all 4 extremities well.  Lab Results: No results found for this basename: WBC, HGB, HCT, PLT,  in the last 72 hours BMET No results found for this basename: NA, K, CL, CO2, GLUCOSE, BUN, CREATININE, CALCIUM,  in the last 72 hours  Studies/Results: Dg Lumbar Spine 2-3 Views  12/25/2012   *RADIOLOGY REPORT*  Clinical Data: L3-S1 laminectomy with L4-5 PLIF.  OPERATIVE LUMBAR SPINE - 2-3 VIEW 1402 hours:  Comparison:  Localization lateral intraoperative lumbar spine x- rays earlier same date 1244 hours.  Findings: Two spot images from the C-arm fluoroscopic device, AP and lateral views of the lower lumbar spine,, were submitted for interpretation post-operatively.  These demonstrate bilateral pedicle screws at L4 and L5 and an interbody fusion plug in the L4- 5 disc space.  The fusion plug is appropriately positioned.  The pedicle screws appear intact.  IMPRESSION: Images obtained during L4-5 PLIF.   Original Report Authenticated By: Hulan Saas, M.D.   Dg Lumbar Spine 1 View  12/25/2012   *RADIOLOGY REPORT*  Clinical Data: L3-S1 posterior fusion.  LUMBAR SPINE - 1 VIEW  Comparison: 10/17/2012  Findings: Posterior surgical instruments are directed at the L3-4 level.  IMPRESSION: Intraoperative localization as above.   Original Report  Authenticated By: Charlett Nose, M.D.   Dg C-arm 1-60 Min  12/25/2012   *RADIOLOGY REPORT*  Clinical Data: L3-S1 laminectomy with L4-5 PLIF.  OPERATIVE LUMBAR SPINE - 2-3 VIEW 1402 hours:  Comparison:  Localization lateral intraoperative lumbar spine x- rays earlier same date 1244 hours.  Findings: Two spot images from the C-arm fluoroscopic device, AP and lateral views of the lower lumbar spine,, were submitted for interpretation post-operatively.  These demonstrate bilateral pedicle screws at L4 and L5 and an interbody fusion plug in the L4- 5 disc space.  The fusion plug is appropriately positioned.  The pedicle screws appear intact.  IMPRESSION: Images obtained during L4-5 PLIF.   Original Report Authenticated By: Hulan Saas, M.D.    Assessment/Plan: The patient is doing well.  LOS: 0 days     Alizandra Loh D 12/25/2012, 4:09 PM

## 2012-12-26 LAB — GLUCOSE, CAPILLARY
Glucose-Capillary: 115 mg/dL — ABNORMAL HIGH (ref 70–99)
Glucose-Capillary: 113 mg/dL — ABNORMAL HIGH (ref 70–99)

## 2012-12-26 LAB — CBC
Hemoglobin: 11.3 g/dL — ABNORMAL LOW (ref 13.0–17.0)
MCH: 29 pg (ref 26.0–34.0)
Platelets: 169 10*3/uL (ref 150–400)
RBC: 3.9 MIL/uL — ABNORMAL LOW (ref 4.22–5.81)

## 2012-12-26 LAB — BASIC METABOLIC PANEL
CO2: 25 mEq/L (ref 19–32)
Calcium: 8.6 mg/dL (ref 8.4–10.5)
GFR calc non Af Amer: 64 mL/min — ABNORMAL LOW (ref >90)
Glucose, Bld: 147 mg/dL — ABNORMAL HIGH (ref 70–99)
Potassium: 4.5 mEq/L (ref 3.5–5.1)
Sodium: 138 mEq/L (ref 135–145)

## 2012-12-26 MED ORDER — ONDANSETRON HCL 4 MG/2ML IJ SOLN: 4.0000 mg | Freq: Four times a day (QID) | INTRAMUSCULAR | Status: DC | PRN

## 2012-12-26 MED ORDER — DIPHENHYDRAMINE HCL 12.5 MG/5ML PO ELIX: 12.5000 mg | ORAL_SOLUTION | Freq: Four times a day (QID) | ORAL | Status: DC | PRN

## 2012-12-26 MED ORDER — SODIUM CHLORIDE 0.9 % IJ SOLN: 9.0000 mL | INTRAMUSCULAR | Status: DC | PRN

## 2012-12-26 MED ORDER — DIPHENHYDRAMINE HCL 50 MG/ML IJ SOLN: 12.5000 mg | Freq: Four times a day (QID) | INTRAMUSCULAR | Status: DC | PRN

## 2012-12-26 MED ORDER — NALOXONE HCL 0.4 MG/ML IJ SOLN: 0.4000 mg | INTRAMUSCULAR | Status: DC | PRN

## 2012-12-26 MED ORDER — MORPHINE SULFATE (PF) 1 MG/ML IV SOLN
INTRAVENOUS | Status: DC
  Administered 2012-12-26: 3 mg via INTRAVENOUS
  Administered 2012-12-26: 4.5 mg via INTRAVENOUS
  Administered 2012-12-26: 09:00:00 via INTRAVENOUS
  Administered 2012-12-26: 4.5 mg via INTRAVENOUS
  Administered 2012-12-26: 6 mg via INTRAVENOUS
  Administered 2012-12-27: 3 mg via INTRAVENOUS
  Administered 2012-12-27: 03:00:00 via INTRAVENOUS
  Administered 2012-12-27: 7.5 mg via INTRAVENOUS
  Administered 2012-12-27: 11.57 mg via INTRAVENOUS
  Filled 2012-12-26 (×2): qty 25

## 2012-12-26 MED ORDER — MORPHINE SULFATE 2 MG/ML IJ SOLN: 2.0000 mg | INTRAMUSCULAR | Status: DC | PRN

## 2012-12-26 NOTE — Progress Notes (Signed)
Patient requests that he keep the PCA stating that his pain is really high and he needs it.  Explained to patient his PRN pain medication options but the patient continues to request the PCA.  Dr. Lovell Sheehan notified; new order received to restart the PCA.  Will continue to monitor.

## 2012-12-26 NOTE — Progress Notes (Signed)
Patient unable to void and was feeling pressure in bladder. Bladder scan showed 718 ml. I/O cath drained .

## 2012-12-26 NOTE — Anesthesia Postprocedure Evaluation (Signed)
  Anesthesia Post-op Note  Patient: Chris Washington  Procedure(s) Performed: Procedure(s) with comments: POSTERIOR LUMBAR FUSION 1 LEVEL (N/A) - Lumbar Three-Four Lumbar Four-Five Lumbar Five-Sacral One Laminectomies with Lumbar Four-Five Posterior lumbar interbody fusion with interbody prothesis posterolateral arthrodesis and posterior nonsegmental instrumentation  Patient Location: Nursing Unit  Anesthesia Type:General  Level of Consciousness: awake, alert  and oriented  Airway and Oxygen Therapy: Patient Spontanous Breathing  Post-op Pain: mild  Post-op Assessment: Post-op Vital signs reviewed and Patient's Cardiovascular Status Stable  Post-op Vital Signs: Reviewed and stable  Complications: No apparent anesthesia complications

## 2012-12-26 NOTE — Evaluation (Signed)
Occupational Therapy Evaluation Patient Details Name: Chris Washington MRN: [id number] DOB: 1951-02-25 Today's Date: 12/26/2012 Time: 5284-1324 OT Time Calculation (min): 40 min  OT Assessment / Plan / Recommendation History of present illness 62 y/o male s/p PLIF L3-4, L4-5, L5-S1.    Clinical Impression   Patient is s/p PLIF resulting in the deficits listed below (see OT Problem List). Patient will benefit from skilled OT to increase their independence and safety prior to discharge home with support from family.      OT Assessment  Patient needs continued OT Services    Follow Up Recommendations  Supervision/Assistance - 24 hour;No OT follow up    Barriers to Discharge      Equipment Recommendations  3 in 1 bedside comode    Recommendations for Other Services    Frequency  Min 2X/week    Precautions / Restrictions Precautions Precautions: Back;Fall Required Braces or Orthoses: Spinal Brace Restrictions Weight Bearing Restrictions: No   Pertinent Vitals/Pain Reports 7-8/10 pain, assisted him with mobility; did c/o of dizziness sitting EOB, BP checked and it was 151/83, his dizziness did start to settle once he had been sitting up a bit     ADL  Eating/Feeding: Independent Where Assessed - Eating/Feeding: Chair Grooming: Set up;Supervision/safety Where Assessed - Grooming: Supported sitting Upper Body Bathing: Set up;Supervision/safety Where Assessed - Upper Body Bathing: Supported sitting Lower Body Bathing: Moderate assistance Where Assessed - Lower Body Bathing: Unsupported sit to stand Upper Body Dressing: Set up;Supervision/safety Where Assessed - Upper Body Dressing: Supported sitting Lower Body Dressing: Moderate assistance Where Assessed - Lower Body Dressing: Unsupported sit to stand Toilet Transfer: Simulated;Min guard Toilet Transfer Method: Sit to Barista: Other (comment) (from bed and recliner chair) Toileting - Clothing  Manipulation and Hygiene: Performed;Min guard (for gown-clothing management) Where Assessed - Toileting Clothing Manipulation and Hygiene: Standing Tub/Shower Transfer Method: Not assessed Equipment Used: Gait belt;Back brace Transfers/Ambulation Related to ADLs: Min A for ambulation. Minguard for transfers. ADL Comments: Pt at Mod A level for LB ADLs. OT spoke with pt about toilet aid and use of reacher for LB dressing.     OT Diagnosis: Acute pain  OT Problem List: Decreased strength;Impaired balance (sitting and/or standing);Decreased activity tolerance;Decreased knowledge of use of DME or AE;Decreased knowledge of precautions;Pain OT Treatment Interventions: Self-care/ADL training;DME and/or AE instruction;Therapeutic activities;Patient/family education;Balance training   OT Goals(Current goals can be found in the care plan section) Acute Rehab OT Goals Patient Stated Goal: home OT Goal Formulation: With patient Time For Goal Achievement: 01/02/13 Potential to Achieve Goals: Good ADL Goals Pt Will Perform Lower Body Dressing: with supervision;with adaptive equipment;sit to/from stand Pt Will Transfer to Toilet: with modified independence;ambulating;bedside commode Pt Will Perform Toileting - Clothing Manipulation and hygiene: with modified independence;sit to/from stand;with adaptive equipment Pt Will Perform Tub/Shower Transfer: Shower transfer;with supervision;ambulating;3 in 1 Additional ADL Goal #1: Pt will be able to verbalize and demonstrate 3/3 back precautions.  Additional ADL Goal #2: Pt/caregiver will be independent in donning/doffing back brace while maintaining precautions.  Visit Information  Last OT Received On: 12/26/12 Assistance Needed: +1 PT/OT Co-Evaluation/Treatment: Yes History of Present Illness: 62 y/o male s/p PLIF L3-4, L4-5, L5-S1.        Prior Functioning     Home Living Family/patient expects to be discharged to:: Private residence Living  Arrangements: Spouse/significant other Available Help at Discharge: Family;Available 24 hours/day Type of Home: House Home Access: Stairs to enter [employer] of Steps: 2 Entrance Stairs-Rails:  None Home Equipment: Shower seat;Cane - single point Prior Function Level of Independence: Independent Communication Communication: No difficulties         Vision/Perception     Cognition  Cognition Arousal/Alertness: Awake/alert Behavior During Therapy: WFL for tasks assessed/performed Overall Cognitive Status: Within Functional Limits for tasks assessed    Extremity/Trunk Assessment Upper Extremity Assessment Upper Extremity Assessment: Overall WFL for tasks assessed Lower Extremity Assessment Lower Extremity Assessment: Generalized weakness     Mobility Bed Mobility Bed Mobility: Rolling Right;Right Sidelying to Sit Rolling Right: 5: Supervision;With rail Right Sidelying to Sit: 5: Supervision;With rails Transfers Transfers: Sit to Stand;Stand to Sit Sit to Stand: 4: Min guard;With upper extremity assist;From bed;From chair/3-in-1 Stand to Sit: 4: Min guard;With upper extremity assist;To chair/3-in-1 Details for Transfer Assistance: Minguard for safety- pt feeling dizzy.     Exercise     Balance     End of Session OT - End of Session Equipment Utilized During Treatment: Gait belt;Back brace Activity Tolerance: Patient tolerated treatment well Patient left: in chair;with call bell/phone within reach;with nursing/sitter in room Nurse Communication: Mobility status  GO     Earlie Raveling OTR/L [phone] 12/26/2012, 2:52 PM

## 2012-12-26 NOTE — Evaluation (Signed)
Physical Therapy Evaluation Patient Details Name: Chris Washington MRN: [id number] DOB: 1950/07/16 Today's Date: 12/26/2012 Time: 7829-5621 PT Time Calculation (min): 31 min  PT Assessment / Plan / Recommendation History of Present Illness  62 y/o male s/p PLIF L3-4, L4-5, L5-S1.   Clinical Impression  Patient is s/p PLIF resulting in the deficits listed below (see PT Problem List).  Patient will benefit from skilled PT to increase their independence and safety with mobility (while adhering to their precautions) to allow discharge home with support from family.        PT Assessment  Patient needs continued PT services    Follow Up Recommendations  Home health PT (vs none pending progress)    Does the patient have the potential to tolerate intense rehabilitation      Barriers to Discharge        Equipment Recommendations  Rolling walker with 5" wheels    Recommendations for Other Services     Frequency Min 5X/week    Precautions / Restrictions Precautions Precautions: Back;Fall Required Braces or Orthoses: Spinal Brace Restrictions Weight Bearing Restrictions: No   Pertinent Vitals/Pain Reports 7/10 pain, assisted him with mobility; did c/o of dizziness sitting EOB, instructed him in ankle pumps to help with venous return, BP checked and it was 151/83, his dizziness did start to settle once he had been sitting up a bit      Mobility  Bed Mobility Bed Mobility: Rolling Right;Right Sidelying to Sit Rolling Right: 5: Supervision;With rail Right Sidelying to Sit: 5: Supervision;With rails Transfers Sit to Stand: 4: Min guard;With upper extremity assist;From bed;From chair/3-in-1 Stand to Sit: 4: Min guard;With upper extremity assist;To chair/3-in-1 Details for Transfer Assistance: Minguard for safety- pt feeling dizzy. Ambulation/Gait Ambulation/Gait Assistance: 4: Min assist Ambulation Distance (Feet): 110 Feet Assistive device: 1 person hand held assist Ambulation/Gait  Assistance Details: min stability assist with HHA and facilitation around his trunk to correct several LOB laterally (pt with delayed righting reactions) Gait Pattern: Step-through pattern;Decreased stride length General Gait Details: increased lateral sway Stairs: No    Exercises     PT Diagnosis: Generalized weakness;Difficulty walking;Acute pain  PT Problem List: Decreased strength;Decreased activity tolerance;Decreased balance;Decreased mobility;Decreased knowledge of use of DME;Pain;Decreased knowledge of precautions PT Treatment Interventions: DME instruction;Gait training;Stair training;Functional mobility training;Therapeutic activities;Therapeutic exercise;Patient/family education;Balance training     PT Goals(Current goals can be found in the care plan section) Acute Rehab PT Goals Patient Stated Goal: home PT Goal Formulation: With patient Time For Goal Achievement: 01/02/13 Potential to Achieve Goals: Good  Visit Information  Last PT Received On: 12/26/12 Assistance Needed: +1 History of Present Illness: 62 y/o male s/p PLIF L3-4, L4-5, L5-S1.        Prior Functioning  Home Living Family/patient expects to be discharged to:: Private residence Living Arrangements: Spouse/significant other Available Help at Discharge: Family;Available 24 hours/day Type of Home: House Home Access: Stairs to enter [employer] of Steps: 2 Entrance Stairs-Rails: None Home Equipment: Shower seat;Cane - single point Prior Function Level of Independence: Independent Communication Communication: No difficulties    Cognition  Cognition Arousal/Alertness: Awake/alert Behavior During Therapy: WFL for tasks assessed/performed Overall Cognitive Status: Within Functional Limits for tasks assessed    Extremity/Trunk Assessment Upper Extremity Assessment Upper Extremity Assessment: Overall WFL for tasks assessed Lower Extremity Assessment Lower Extremity Assessment: Generalized  weakness   Balance    End of Session PT - End of Session Equipment Utilized During Treatment: Gait belt;Back brace Activity Tolerance: Patient tolerated treatment  well Patient left: in chair;with call bell/phone within reach Nurse Communication: Mobility status  GP     [facility] Chris Washington 12/26/2012, 1:42 PM

## 2012-12-26 NOTE — Progress Notes (Signed)
Patient ID: Chris Washington, male   DOB: November 08, 1950, 62 y.o.   MRN: [id number] Subjective:  the patient is alert and pleasant. He looks well. He has no complaints.  Objective: Vital signs in last 24 hours: Temp:  [97.3 F (36.3 C)-98.3 F (36.8 C)] 97.7 F (36.5 C) (07/29 0223) Pulse Rate:  [58-83] 69 (07/29 0223) Resp:  [9-22] 22 (07/29 0437) BP: (121-175)/(67-123) 143/88 mmHg (07/29 0223) SpO2:  [93 %-99 %] 97 % (07/29 0437) FiO2 (%):  [3 %] 3 % (07/28 1840)  Intake/Output from previous day: 07/28 0701 - 07/29 0700 In: 1500 [I.V.:1500] Out: 2115 [Urine:1370; Emesis/NG output:25; Drains:470; Blood:250] Intake/Output this shift:    Physical exam the patient is alert and oriented x3. He is moving his lower extremities well. His dressing is clean and dry.  Lab Results:  Recent Labs  12/26/12 0600  WBC 15.7*  HGB 11.3*  HCT 34.1*  PLT 169   BMET  Recent Labs  12/26/12 0600  NA 138  K 4.5  CL 104  CO2 25  GLUCOSE 147*  BUN 20  CREATININE 1.18  CALCIUM 8.6    Studies/Results: Dg Lumbar Spine 2-3 Views  12/25/2012   *RADIOLOGY REPORT*  Clinical Data: L3-S1 laminectomy with L4-5 PLIF.  OPERATIVE LUMBAR SPINE - 2-3 VIEW 1402 hours:  Comparison:  Localization lateral intraoperative lumbar spine x- rays earlier same date 1244 hours.  Findings: Two spot images from the C-arm fluoroscopic device, AP and lateral views of the lower lumbar spine,, were submitted for interpretation post-operatively.  These demonstrate bilateral pedicle screws at L4 and L5 and an interbody fusion plug in the L4- 5 disc space.  The fusion plug is appropriately positioned.  The pedicle screws appear intact.  IMPRESSION: Images obtained during L4-5 PLIF.   Original Report Authenticated By: Hulan Saas, M.D.   Dg Lumbar Spine 1 View  12/25/2012   *RADIOLOGY REPORT*  Clinical Data: L3-S1 posterior fusion.  LUMBAR SPINE - 1 VIEW  Comparison: 10/17/2012  Findings: Posterior surgical instruments are  directed at the L3-4 level.  IMPRESSION: Intraoperative localization as above.   Original Report Authenticated By: Charlett Nose, M.D.   Dg C-arm 1-60 Min  12/25/2012   *RADIOLOGY REPORT*  Clinical Data: L3-S1 laminectomy with L4-5 PLIF.  OPERATIVE LUMBAR SPINE - 2-3 VIEW 1402 hours:  Comparison:  Localization lateral intraoperative lumbar spine x- rays earlier same date 1244 hours.  Findings: Two spot images from the C-arm fluoroscopic device, AP and lateral views of the lower lumbar spine,, were submitted for interpretation post-operatively.  These demonstrate bilateral pedicle screws at L4 and L5 and an interbody fusion plug in the L4- 5 disc space.  The fusion plug is appropriately positioned.  The pedicle screws appear intact.  IMPRESSION: Images obtained during L4-5 PLIF.   Original Report Authenticated By: Hulan Saas, M.D.    Assessment/Plan: Postop day 1: I will plan to discontinue his drain tomorrow. We will mobilize him with PT and OT. I'll discontinue his PCA pump.   LOS: 1 day     Janee Ureste D 12/26/2012, 7:42 AM

## 2012-12-26 NOTE — Progress Notes (Signed)
   CARE MANAGEMENT NOTE 12/26/2012  Patient:  Chris Washington, Chris Washington   Account Number:  [id number]  Date Initiated:  12/26/2012  Documentation initiated by:  Jiles Crocker  Subjective/Objective Assessment:   ADMITTED FOR SURGERY - L3, L4 and L5 laminectomy with/foraminotomies to decompress the bilateral L3, L4, L5 and S1 nerve roots;     Action/Plan:   LIVES AT HOME WITH SPOUSE; CM FOLLOWING FOR DCP   Anticipated DC Date:  01/02/2013   Anticipated DC Plan:  POSSIBLY HOME W HOME HEALTH SERVICES DEPENDING ON HOW PATIENT PROGRESS; AWAITING FOR PT/OT EVALS;      DC Planning Services  CM consult          Status of service:  In process, will continue to follow Medicare Important Message given?  NA - LOS <3 / Initial given by admissions (If response is "NO", the following Medicare IM given date fields will be blank)  Per UR Regulation:  Reviewed for med. necessity/level of care/duration of stay  Comments:  12/26/2012- B Sidnie Swalley RN,BSN,MHA

## 2012-12-27 LAB — GLUCOSE, CAPILLARY
Glucose-Capillary: 118 mg/dL — ABNORMAL HIGH (ref 70–99)
Glucose-Capillary: 138 mg/dL — ABNORMAL HIGH (ref 70–99)

## 2012-12-27 MED ORDER — MORPHINE SULFATE 2 MG/ML IJ SOLN: 2.0000 mg | INTRAMUSCULAR | Status: DC | PRN

## 2012-12-27 MED ORDER — TAMSULOSIN HCL 0.4 MG PO CAPS
0.4000 mg | ORAL_CAPSULE | Freq: Every morning | ORAL | Status: DC
  Administered 2012-12-27 – 2012-12-29 (×3): 0.4 mg via ORAL
  Filled 2012-12-27 (×3): qty 1

## 2012-12-27 MED FILL — Sodium Chloride IV Soln 0.9%: INTRAVENOUS | Qty: 1000 | Status: AC

## 2012-12-27 MED FILL — Sodium Chloride Irrigation Soln 0.9%: Qty: 3000 | Status: AC

## 2012-12-27 MED FILL — Heparin Sodium (Porcine) Inj 1000 Unit/ML: INTRAMUSCULAR | Qty: 30 | Status: AC

## 2012-12-27 NOTE — Progress Notes (Signed)
Occupational Therapy Treatment Patient Details Name: Chris Washington MRN: [id number] DOB: 1950-10-20 Today's Date: 12/27/2012 Time: 6295-2841 OT Time Calculation (min): 29 min  OT Assessment / Plan / Recommendation  History of present illness 62 y/o male s/p PLIF L3-4, L4-5, L5-S1.    OT comments  Pt and wife reviewing back handout on arrival. Focus of session on LB dressing and maintain back precautions with adls. All aspects of adls covered this session. Pt and wife without further questions. OT to address shower transfer next session. Pt's wife has shower seat and will purchase no slip mat.  Follow Up Recommendations  Supervision/Assistance - 24 hour;No OT follow up    Barriers to Discharge       Equipment Recommendations  3 in 1 bedside comode    Recommendations for Other Services    Frequency Min 2X/week   Progress towards OT Goals Progress towards OT goals: Progressing toward goals  Plan Discharge plan remains appropriate    Precautions / Restrictions Precautions Precautions: Back;Fall Required Braces or Orthoses: Spinal Brace   Pertinent Vitals/Pain No pain reported    ADL  Lower Body Bathing: Modified independent Where Assessed - Lower Body Bathing: Supported sitting Upper Body Dressing: Modified independent Where Assessed - Upper Body Dressing: Supported sitting Lower Body Dressing: Modified independent Where Assessed - Lower Body Dressing: Unsupported sit to stand ADL Comments: Pt verbalized 3 out 3 back precautions. Pt educated on washing hair, shower transfer, car transfer, reviewed bed mobilty, home setup to prevent breaking back precautions, personal hygiene, change of position and lb dressing. Pt is able to cross bil Le this session so no AE needed. Pt and wife educated on the [place] of reacher and where to purchase if needed. Pt plans to wear tennis shoes and educated on elastic laces. Pt progressing well and tolerating OOB. pt demo and verbalized don doff brace 100%.  wife will be available at d/c to assist        OT Goals(current goals can now be found in the care plan section) Acute Rehab OT Goals Patient Stated Goal: home OT Goal Formulation: With patient Time For Goal Achievement: 01/02/13 Potential to Achieve Goals: Good ADL Goals Pt Will Perform Lower Body Dressing: with supervision;with adaptive equipment;sit to/from stand Pt Will Transfer to Toilet: with modified independence;ambulating;bedside commode Pt Will Perform Toileting - Clothing Manipulation and hygiene: with modified independence;sit to/from stand;with adaptive equipment Pt Will Perform Tub/Shower Transfer: Shower transfer;with supervision;ambulating;3 in 1 Additional ADL Goal #1: Pt will be able to verbalize and demonstrate 3/3 back precautions.  Additional ADL Goal #2: Pt/caregiver will be independent in donning/doffing back brace while maintaining precautions.  Visit Information  Last OT Received On: 12/27/12 Assistance Needed: +1 History of Present Illness: 62 y/o male s/p PLIF L3-4, L4-5, L5-S1.                Cognition  Cognition Arousal/Alertness: Awake/alert Behavior During Therapy: WFL for tasks assessed/performed Overall Cognitive Status: Within Functional Limits for tasks assessed                    End of Session OT - End of Session Activity Tolerance: Patient tolerated treatment well Patient left: in chair;with call bell/phone within reach;with family/visitor present Nurse Communication: Mobility status  GO     Jcion, Buddenhagen 12/27/2012, 4:00 PM Pager: [phone]

## 2012-12-27 NOTE — Progress Notes (Signed)
Patient ID: Chris Washington, male   DOB: 05/24/1951, 62 y.o.   MRN: [id number] Subjective:  The patient is alert and pleasant. He feels better today.  Objective: Vital signs in last 24 hours: Temp:  [98.1 F (36.7 C)-100.6 F (38.1 C)] 100.6 F (38.1 C) (07/30 1147) Pulse Rate:  [69-97] 97 (07/30 1147) Resp:  [16-20] 18 (07/30 1147) BP: (123-161)/(63-79) 156/79 mmHg (07/30 1147) SpO2:  [92 %-96 %] 93 % (07/30 1147) Weight:  [92.987 kg (205 lb)] 92.987 kg (205 lb) (07/29 2026)  Intake/Output from previous day: 07/29 0701 - 07/30 0700 In: -  Out: 3325 [Urine:3090; Drains:235] Intake/Output this shift:    Physical exam the patient is alert and oriented. His strength is normal.  Lab Results:  Recent Labs  12/26/12 0600  WBC 15.7*  HGB 11.3*  HCT 34.1*  PLT 169   BMET  Recent Labs  12/26/12 0600  NA 138  K 4.5  CL 104  CO2 25  GLUCOSE 147*  BUN 20  CREATININE 1.18  CALCIUM 8.6    Studies/Results: Dg Lumbar Spine 2-3 Views  12/25/2012   *RADIOLOGY REPORT*  Clinical Data: L3-S1 laminectomy with L4-5 PLIF.  OPERATIVE LUMBAR SPINE - 2-3 VIEW 1402 hours:  Comparison:  Localization lateral intraoperative lumbar spine x- rays earlier same date 1244 hours.  Findings: Two spot images from the C-arm fluoroscopic device, AP and lateral views of the lower lumbar spine,, were submitted for interpretation post-operatively.  These demonstrate bilateral pedicle screws at L4 and L5 and an interbody fusion plug in the L4- 5 disc space.  The fusion plug is appropriately positioned.  The pedicle screws appear intact.  IMPRESSION: Images obtained during L4-5 PLIF.   Original Report Authenticated By: Hulan Saas, M.D.   Dg Lumbar Spine 1 View  12/25/2012   *RADIOLOGY REPORT*  Clinical Data: L3-S1 posterior fusion.  LUMBAR SPINE - 1 VIEW  Comparison: 10/17/2012  Findings: Posterior surgical instruments are directed at the L3-4 level.  IMPRESSION: Intraoperative localization as above.    Original Report Authenticated By: Charlett Nose, M.D.   Dg C-arm 1-60 Min  12/25/2012   *RADIOLOGY REPORT*  Clinical Data: L3-S1 laminectomy with L4-5 PLIF.  OPERATIVE LUMBAR SPINE - 2-3 VIEW 1402 hours:  Comparison:  Localization lateral intraoperative lumbar spine x- rays earlier same date 1244 hours.  Findings: Two spot images from the C-arm fluoroscopic device, AP and lateral views of the lower lumbar spine,, were submitted for interpretation post-operatively.  These demonstrate bilateral pedicle screws at L4 and L5 and an interbody fusion plug in the L4- 5 disc space.  The fusion plug is appropriately positioned.  The pedicle screws appear intact.  IMPRESSION: Images obtained during L4-5 PLIF.   Original Report Authenticated By: Hulan Saas, M.D.    Assessment/Plan: Postop day #2: I will discontinue the PCA and Hemovac. We will mobilize the patient he will likely go home tomorrow.  LOS: 2 days     [name] D 12/27/2012, 12:24 PM

## 2012-12-27 NOTE — Progress Notes (Signed)
Hemovac, foley and PCA d/c'd at this time per order.

## 2012-12-27 NOTE — Progress Notes (Signed)
Patient was feeling severe pressure in bladder but unable to void since 2300. Pt already had two I/O cath. Foley in place per protocol. Drained 800 ml straight out ( bladder scan showed 118 ml). Pt educated pre procedure. Mary Rose/RN assisted the procedure.

## 2012-12-27 NOTE — Progress Notes (Signed)
Physical Therapy Treatment Patient Details Name: CLENNON NASCA MRN: [id number] DOB: 1950/06/14 Today's Date: 12/27/2012 Time: 4098-1191 PT Time Calculation (min): 32 min  PT Assessment / Plan / Recommendation  History of Present Illness 62 y/o male s/p PLIF L3-4, L4-5, L5-S1.    PT Comments   Reinforced back precautions with education for wife. Gave handout. Good progress. Unsteady on his feet (?meds?)  Follow Up Recommendations  Home health PT     Does the patient have the potential to tolerate intense rehabilitation     Barriers to Discharge        Equipment Recommendations       Recommendations for Other Services    Frequency Min 5X/week   Progress towards PT Goals Progress towards PT goals: Progressing toward goals  Plan Current plan remains appropriate    Precautions / Restrictions Precautions Precautions: Fall;Back Required Braces or Orthoses: Spinal Brace Spinal Brace: Applied in sitting position   Pertinent Vitals/Pain Reports minimal pain    Mobility  Bed Mobility Bed Mobility: Rolling Right Rolling Right: 5: Supervision Right Sidelying to Sit: 5: Supervision Details for Bed Mobility Assistance: cues for technique Transfers Transfers: Sit to Stand;Stand to Sit Sit to Stand: 4: Min guard;With upper extremity assist Stand to Sit: 4: Min guard;With upper extremity assist Details for Transfer Assistance: cues for safe hand placement Ambulation/Gait Ambulation/Gait Assistance: 4: Min guard Ambulation Distance (Feet): 800 Feet (1 seated rest after 500 ft) Assistive device: None Ambulation/Gait Assistance Details: gaurding for stability, pt had just had pain meds making him slightly "drunk" feeling with increased lateral sway Gait Pattern: Step-through pattern;Wide base of support;Decreased stride length Stairs: No      PT Goals (current goals can now be found in the care plan section) Acute Rehab PT Goals Patient Stated Goal: home  Visit Information  Last  PT Received On: 12/27/12 Assistance Needed: +1 History of Present Illness: 62 y/o male s/p PLIF L3-4, L4-5, L5-S1.     Subjective Data  Patient Stated Goal: home   Cognition  Cognition Arousal/Alertness: Awake/alert Behavior During Therapy: WFL for tasks assessed/performed Overall Cognitive Status: Within Functional Limits for tasks assessed    Balance     End of Session PT - End of Session Equipment Utilized During Treatment: Gait belt;Back brace Activity Tolerance: Patient tolerated treatment well Patient left: in chair;with call bell/phone within reach Nurse Communication: Mobility status   GP     [name] 12/27/2012, 4:23 PM

## 2012-12-28 ENCOUNTER — Inpatient Hospital Stay (HOSPITAL_COMMUNITY): Payer: BC Managed Care – PPO

## 2012-12-28 LAB — GLUCOSE, CAPILLARY
Glucose-Capillary: 115 mg/dL — ABNORMAL HIGH (ref 70–99)
Glucose-Capillary: 124 mg/dL — ABNORMAL HIGH (ref 70–99)
Glucose-Capillary: 145 mg/dL — ABNORMAL HIGH (ref 70–99)
Glucose-Capillary: 147 mg/dL — ABNORMAL HIGH (ref 70–99)
Glucose-Capillary: 154 mg/dL — ABNORMAL HIGH (ref 70–99)

## 2012-12-28 LAB — URINALYSIS, ROUTINE W REFLEX MICROSCOPIC
Hgb urine dipstick: NEGATIVE
Nitrite: NEGATIVE
Specific Gravity, Urine: 1.029 (ref 1.005–1.030)
Urobilinogen, UA: 0.2 mg/dL (ref 0.0–1.0)
pH: 5.5 (ref 5.0–8.0)

## 2012-12-28 LAB — CBC WITH DIFFERENTIAL/PLATELET
Basophils Absolute: 0 10*3/uL (ref 0.0–0.1)
Basophils Relative: 0 % (ref 0–1)
Lymphocytes Relative: 19 % (ref 12–46)
Neutro Abs: 8.2 10*3/uL — ABNORMAL HIGH (ref 1.7–7.7)
Platelets: 166 10*3/uL (ref 150–400)
RDW: 13.7 % (ref 11.5–15.5)
WBC: 12.2 10*3/uL — ABNORMAL HIGH (ref 4.0–10.5)

## 2012-12-28 LAB — URINE MICROSCOPIC-ADD ON

## 2012-12-28 MED ORDER — POLYETHYLENE GLYCOL 3350 17 G PO PACK
17.0000 g | PACK | Freq: Every day | ORAL | Status: DC
  Administered 2012-12-28 – 2012-12-29 (×2): 17 g via ORAL
  Filled 2012-12-28 (×2): qty 1

## 2012-12-28 NOTE — Progress Notes (Signed)
Patient ID: Chris Washington, male   DOB: [birth year], 62 y.o.   MRN: [id number] Subjective:  the patient is alert and pleasant. He looks well. He is in no apparent distress.  Objective: Vital signs in last 24 hours: Temp:  [99.1 F (37.3 C)-101.2 F (38.4 C)] 99.1 F (37.3 C) (07/31 1409) Pulse Rate:  [90-163] 163 (07/31 1409) Resp:  [18-20] 20 (07/31 1409) BP: (124-153)/(66-94) 153/94 mmHg (07/31 1409) SpO2:  [90 %-93 %] 90 % (07/31 1409)  Intake/Output from previous day: 07/30 0701 - 07/31 0700 In: -  Out: 1050 [Urine:1050] Intake/Output this shift: Total I/O In: 120 [P.O.:120] Out: 175 [Urine:175]  Physical exam patient is alert and oriented. He is moving his lower extremities well.  Lab Results:  Recent Labs  12/26/12 0600  WBC 15.7*  HGB 11.3*  HCT 34.1*  PLT 169   BMET  Recent Labs  12/26/12 0600  NA 138  K 4.5  CL 104  CO2 25  GLUCOSE 147*  BUN 20  CREATININE 1.18  CALCIUM 8.6    Studies/Results: No results found.  Assessment/Plan: Postop day #3: The patient is doing well except for fevers. I will check a urinalysis, urine cultures, blood cultures, chest x-ray, etc. He may be able to go home tomorrow.   LOS: 3 days     Niyah Mamaril D 12/28/2012, 5:01 PM

## 2012-12-28 NOTE — Progress Notes (Signed)
Physical Therapy Treatment Patient Details Name: Chris Washington MRN: [id number] DOB: 12/03/1950 Today's Date: 12/28/2012 Time: 4098-1191 PT Time Calculation (min): 19 min  PT Assessment / Plan / Recommendation  History of Present Illness 62 y/o male s/p PLIF L3-4, L4-5, L5-S1.    PT Comments   Still unsteady with gait. Will try RW vs cane tomorrow to determine best AD so as to increase independence and safety with gait.   Follow Up Recommendations  Home health PT     Does the patient have the potential to tolerate intense rehabilitation     Barriers to Discharge        Equipment Recommendations  None recommended by PT    Recommendations for Other Services    Frequency     Progress towards PT Goals Progress towards PT goals: Progressing toward goals  Plan Current plan remains appropriate    Precautions / Restrictions Precautions Precautions: Fall;Back Required Braces or Orthoses: Spinal Brace Spinal Brace: Applied in sitting position   Pertinent Vitals/Pain Reports 7/10 back pain, RN to provide pain meds    Mobility  Bed Mobility Bed Mobility: Not assessed Transfers Transfers: Sit to Stand Stand to Sit: To chair/3-in-1;5: Supervision Details for Transfer Assistance: pt standing in the bathroom on my arrival, cues for safe body mechanics to sit Ambulation/Gait Ambulation/Gait Assistance: 4: Min guard;4: Min assist Ambulation Distance (Feet): 600 Feet Assistive device: None Ambulation/Gait Assistance Details: still with increased lateral sway and several minor LOB however pt stepping to correct but delayed because of pain and stiffness, occasonally needing hands on assist to steady, may need to try RW tomorrow to see if we can get him more independent with ambulation Gait Pattern: Step-through pattern;Decreased stride length;Decreased trunk rotation General Gait Details: increased lateral sway Stairs: Yes Stairs Assistance: 5: Supervision Stairs Assistance Details  (indicate cue type and reason): initial instruction in technique Stair Management Technique: Two rails;Forwards;Step to pattern Number of Stairs: 4      PT Goals (current goals can now be found in the care plan section)    Visit Information  Last PT Received On: 12/28/12 Assistance Needed: +1 History of Present Illness: 62 y/o male s/p PLIF L3-4, L4-5, L5-S1.     Subjective Data      Cognition  Cognition Arousal/Alertness: Awake/alert Behavior During Therapy: WFL for tasks assessed/performed Overall Cognitive Status: Within Functional Limits for tasks assessed    Balance     End of Session PT - End of Session Equipment Utilized During Treatment: Gait belt;Back brace Activity Tolerance: Patient tolerated treatment well Patient left: in chair;with call bell/phone within reach Nurse Communication: Patient requests pain meds   GP     [name] 12/28/2012, 10:43 AM

## 2012-12-29 LAB — GLUCOSE, CAPILLARY: Glucose-Capillary: 95 mg/dL (ref 70–99)

## 2012-12-29 MED ORDER — BISACODYL 10 MG RE SUPP: 10.0000 mg | Freq: Every day | RECTAL | Status: DC | PRN

## 2012-12-29 MED ORDER — DIAZEPAM 5 MG PO TABS: 5.0000 mg | ORAL_TABLET | Freq: Four times a day (QID) | ORAL | Status: DC | PRN

## 2012-12-29 MED ORDER — TAMSULOSIN HCL 0.4 MG PO CAPS: 0.4000 mg | ORAL_CAPSULE | Freq: Every morning | ORAL | Status: AC

## 2012-12-29 MED ORDER — OXYCODONE-ACETAMINOPHEN 10-325 MG PO TABS: 1.0000 | ORAL_TABLET | ORAL | Status: DC | PRN

## 2012-12-29 MED ORDER — BISACODYL 10 MG RE SUPP
10.0000 mg | Freq: Every day | RECTAL | Status: DC | PRN
  Administered 2012-12-29: 10 mg via RECTAL
  Filled 2012-12-29: qty 1

## 2012-12-29 NOTE — Discharge Summary (Signed)
Physician Discharge Summary  Patient ID: Chris Washington MRN: [id number] DOB/AGE: 12-31-1950 62 y.o.  Admit date: 12/25/2012 Discharge date: 12/29/2012  Admission Diagnoses:L3-4, L4-5 and L5-S1 spinal stenosis, lumbago, lumbar discomfort, L4-5 spondylolisthesis, urinary retention  Discharge Diagnoses: the same Active Problems:   * No active hospital problems. *   Discharged Condition: good  Hospital Course: I performed at L3-4, L4-5 and L5-S1 decompressive laminectomy with 8 instrumentation and fusion at L4-5 on the patient on 12/25/2012. The surgery went well.  The patient's postoperative course was remarkable only for urinary retention. He was started on Flomax. This resolved.  The patient did also have some low-grade fevers. He was worked up with a urinalysis, urine culture, blood cultures, chest x-ray, CBC, et Karie Soda. They turn out okay. His fever resolved.  On 12/29/2012 the patient requested discharge to home. He was given oral and written discharge instructions. All his questions were answered.  Consults:none Significant Diagnostic Studies:none Treatments:L3-4, L4-5 and L5-S1 decompressive laminectomy, L4-5 instrumentation and fusion. Discharge Exam: Blood pressure 119/63, pulse 90, temperature 98.4 F (36.9 C), temperature source Oral, resp. rate 20, height 5\' 10"  (1.778 m), weight 92.987 kg (205 lb), SpO2 93.00%. The patient is alert and oriented. He looks well. His wound is healing well without signs of infection. His lower extremity strength is normal.  Disposition: home  Discharge Orders   Future Orders Complete By Expires     Call MD for:  difficulty breathing, headache or visual disturbances  As directed     Call MD for:  extreme fatigue  As directed     Call MD for:  hives  As directed     Call MD for:  persistant dizziness or light-headedness  As directed     Call MD for:  persistant nausea and vomiting  As directed     Call MD for:  redness, tenderness, or signs  of infection (pain, swelling, redness, odor or green/yellow discharge around incision site)  As directed     Call MD for:  severe uncontrolled pain  As directed     Call MD for:  temperature >100.4  As directed     Diet - low sodium heart healthy  As directed     Discharge instructions  As directed     Comments:      Call [phone] for a followup appointment. Take a stool softener while you are using pain medications.    Driving Restrictions  As directed     Comments:      Do not drive for 2 weeks.    Increase activity slowly  As directed     Lifting restrictions  As directed     Comments:      Do not lift more than 5 pounds. No excessive bending or twisting.    May shower / Bathe  As directed     Comments:      He may shower after the pain she is removed 3 days after surgery. Leave the incision alone.    No dressing needed  As directed         Medication List    STOP taking these medications       etodolac 400 MG tablet  Commonly known as:  LODINE      TAKE these medications       amLODipine 5 MG tablet  Commonly known as:  NORVASC  Take 5 mg by mouth daily.     bisacodyl 10 MG suppository  Commonly known as:  DULCOLAX  Place 1 suppository (10 mg total) rectally daily as needed.     budesonide-formoterol 160-4.5 MCG/ACT inhaler  Commonly known as:  SYMBICORT  Inhale 2 puffs into the lungs 2 (two) times daily as needed.     calcium carbonate 500 MG chewable tablet  Commonly known as:  TUMS - dosed in mg elemental calcium  Chew 1 tablet by mouth 3 (three) times daily as needed for heartburn.     diazepam 5 MG tablet  Commonly known as:  VALIUM  Take 1 tablet (5 mg total) by mouth every 6 (six) hours as needed.     lisinopril 20 MG tablet  Commonly known as:  PRINIVIL,ZESTRIL  Take 20 mg by mouth daily.     metFORMIN 500 MG 24 hr tablet  Commonly known as:  GLUCOPHAGE-XR  Take 1,000 mg by mouth every evening.     oxyCODONE-acetaminophen 10-325 MG per tablet   Commonly known as:  PERCOCET  Take 1 tablet by mouth every 4 (four) hours as needed for pain.     tamsulosin 0.4 MG Caps  Commonly known as:  FLOMAX  Take 1 capsule (0.4 mg total) by mouth every morning.         SignedTressie Stalker D 12/29/2012, 1:12 PM

## 2012-12-29 NOTE — Progress Notes (Signed)
Physical Therapy Treatment and Discharge Patient Details Name: Chris Washington MRN: [id number] DOB: 01/27/1951 Today's Date: 12/29/2012 Time: 4098-1191 PT Time Calculation (min): 23 min  PT Assessment / Plan / Recommendation  History of Present Illness 62 y/o male s/p PLIF L3-4, L4-5, L5-S1.    PT Comments   Patient moving well. Much more steady using RW. Reviewed safety precautions with patient. He will require supervision when walking especially when on the pain medicine for stability.Patient is aware of this. Follows back precautions. Wife will assist him at home. No further PT needs at this time. He is to use his RW until he is more steady on his feet.   Follow Up Recommendations  No PT follow up     Does the patient have the potential to tolerate intense rehabilitation     Barriers to Discharge        Equipment Recommendations  None recommended by PT    Recommendations for Other Services    Frequency     Progress towards PT Goals Progress towards PT goals: Goals met/education completed, patient discharged from PT  Plan Discharge plan needs to be updated    Precautions / Restrictions Precautions Precautions: Fall;Back Required Braces or Orthoses: Spinal Brace Spinal Brace: Applied in sitting position   Pertinent Vitals/Pain Reports minimal back pain, having just had pain meds    Mobility  Bed Mobility Bed Mobility: Rolling Right;Right Sidelying to Sit Rolling Right: 6: Modified independent (Device/Increase time) Right Sidelying to Sit: 6: Modified independent (Device/Increase time) Transfers Transfers: Sit to Stand;Stand to Sit Sit to Stand: 6: Modified independent (Device/Increase time) Stand to Sit: 6: Modified independent (Device/Increase time) Details for Transfer Assistance: cues for hand placement initially when using RW Ambulation/Gait Ambulation/Gait Assistance: 5: Supervision;6: Modified independent (Device/Increase time) Ambulation Distance (Feet): 800  Feet Assistive device: Rolling walker Ambulation/Gait Assistance Details: more stable with RW today, one moderate stagger when patient distracted needing minA to correct otherwise supervision-modified independent Gait Pattern: Step-through pattern      PT Goals (current goals can now be found in the care plan section)    Visit Information  Last PT Received On: 12/29/12 Assistance Needed: +1 History of Present Illness: 62 y/o male s/p PLIF L3-4, L4-5, L5-S1.     Subjective Data      Cognition  Cognition Arousal/Alertness: Awake/alert Behavior During Therapy: WFL for tasks assessed/performed Overall Cognitive Status: Within Functional Limits for tasks assessed    Balance     End of Session PT - End of Session Equipment Utilized During Treatment: Back brace Activity Tolerance: Patient tolerated treatment well Patient left: in chair;with call bell/phone within reach Nurse Communication: Mobility status   GP     [name] 12/29/2012, 12:43 PM

## 2012-12-30 LAB — URINE CULTURE: Special Requests: NORMAL

## 2013-01-04 LAB — CULTURE, BLOOD (ROUTINE X 2): Culture: NO GROWTH

## 2014-05-16 IMAGING — RF DG C-ARM 61-120 MIN
1 series · 2 of 2 positions shown · non-contrast
Comparison: Localization lateral intraoperative lumbar spine x-
rays earlier same date 8200 hours.

CLINICAL DATA: L3-S1 laminectomy with L4-5 PLIF.

OPERATIVE LUMBAR SPINE - 2-3 VIEW 4419 hours:

[Series 1: run · 2 of 2 slices shown]
[im 1/2]
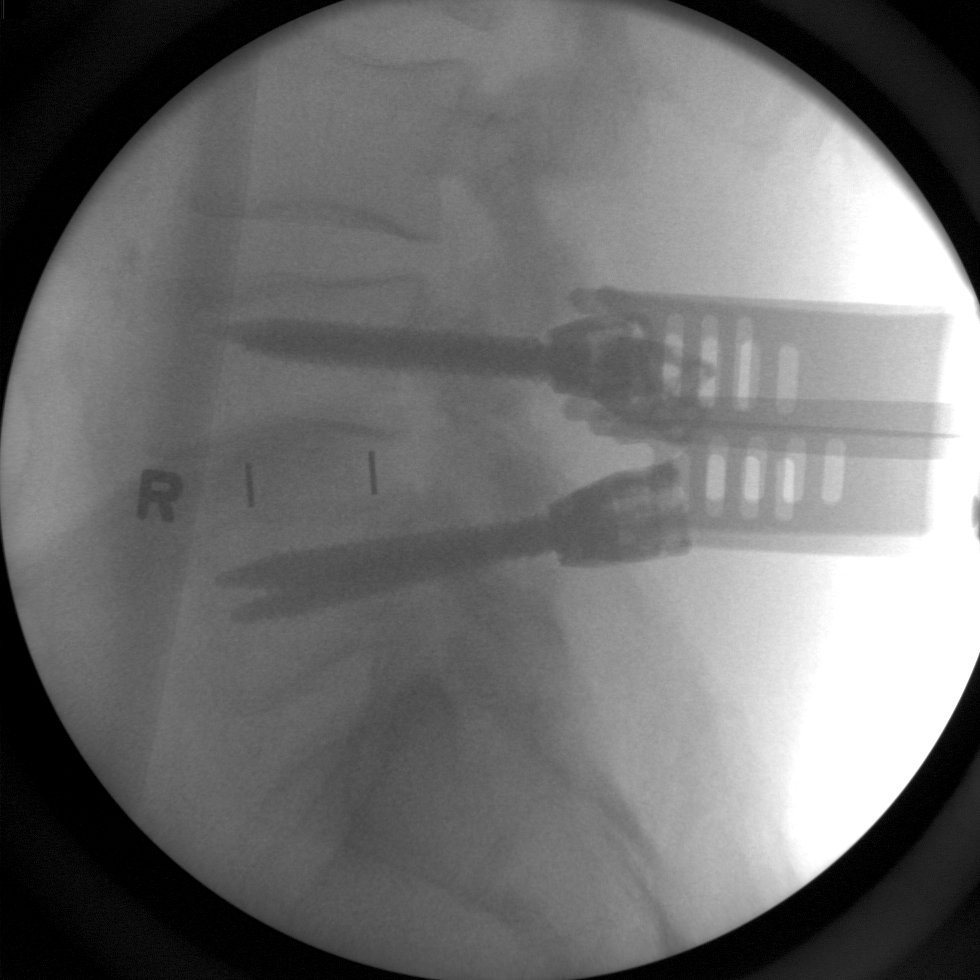
[im 2/2]
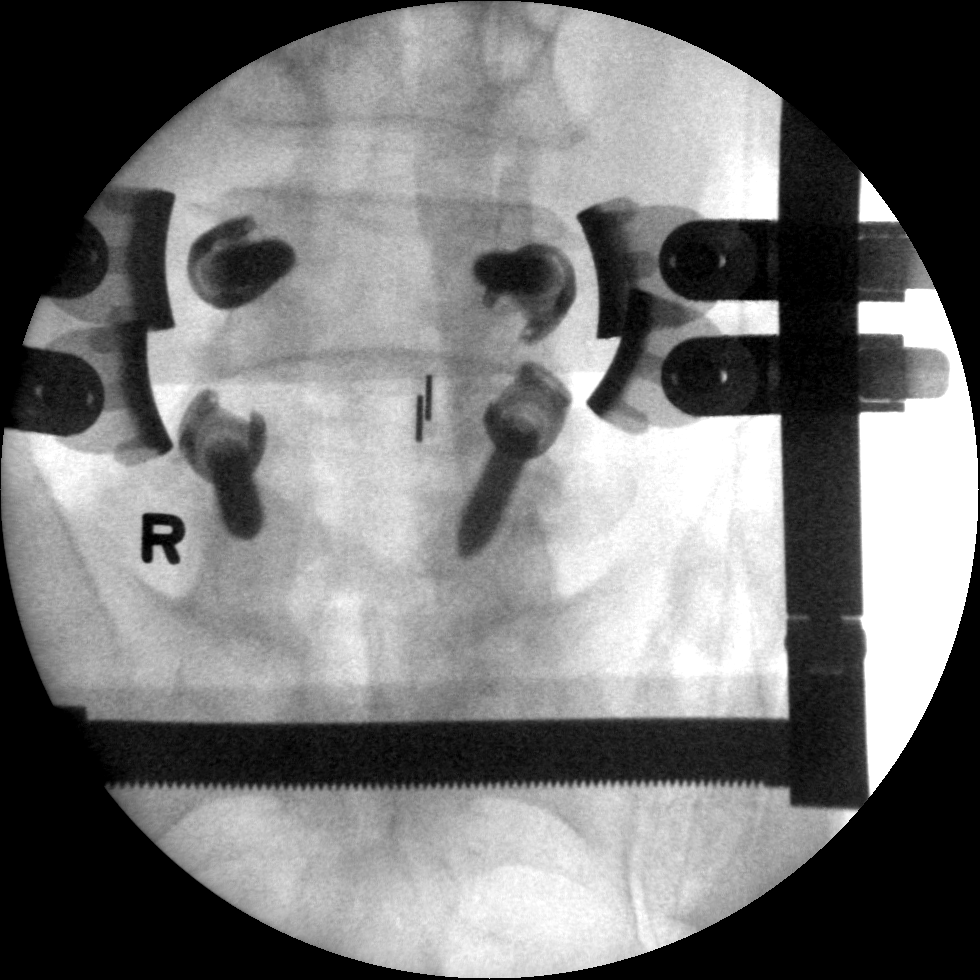

[2 of 2 positions shown; findings below may reference images not displayed]

FINDINGS: Two spot images from the C-arm fluoroscopic device, AP
and lateral views of the lower lumbar spine,, were submitted for
interpretation post-operatively.  These demonstrate bilateral
pedicle screws at L4 and L5 and an interbody fusion plug in the L4-
5 disc space.  The fusion plug is appropriately positioned.  The
pedicle screws appear intact.
IMPRESSION: Images obtained during L4-5 PLIF.

## 2014-05-16 IMAGING — DX DG LUMBAR SPINE 1V
1 series · 1 of 1 positions shown · non-contrast
Comparison: 10/17/2012

CLINICAL DATA: L3-S1 posterior fusion.

LUMBAR SPINE - 1 VIEW

[lat]
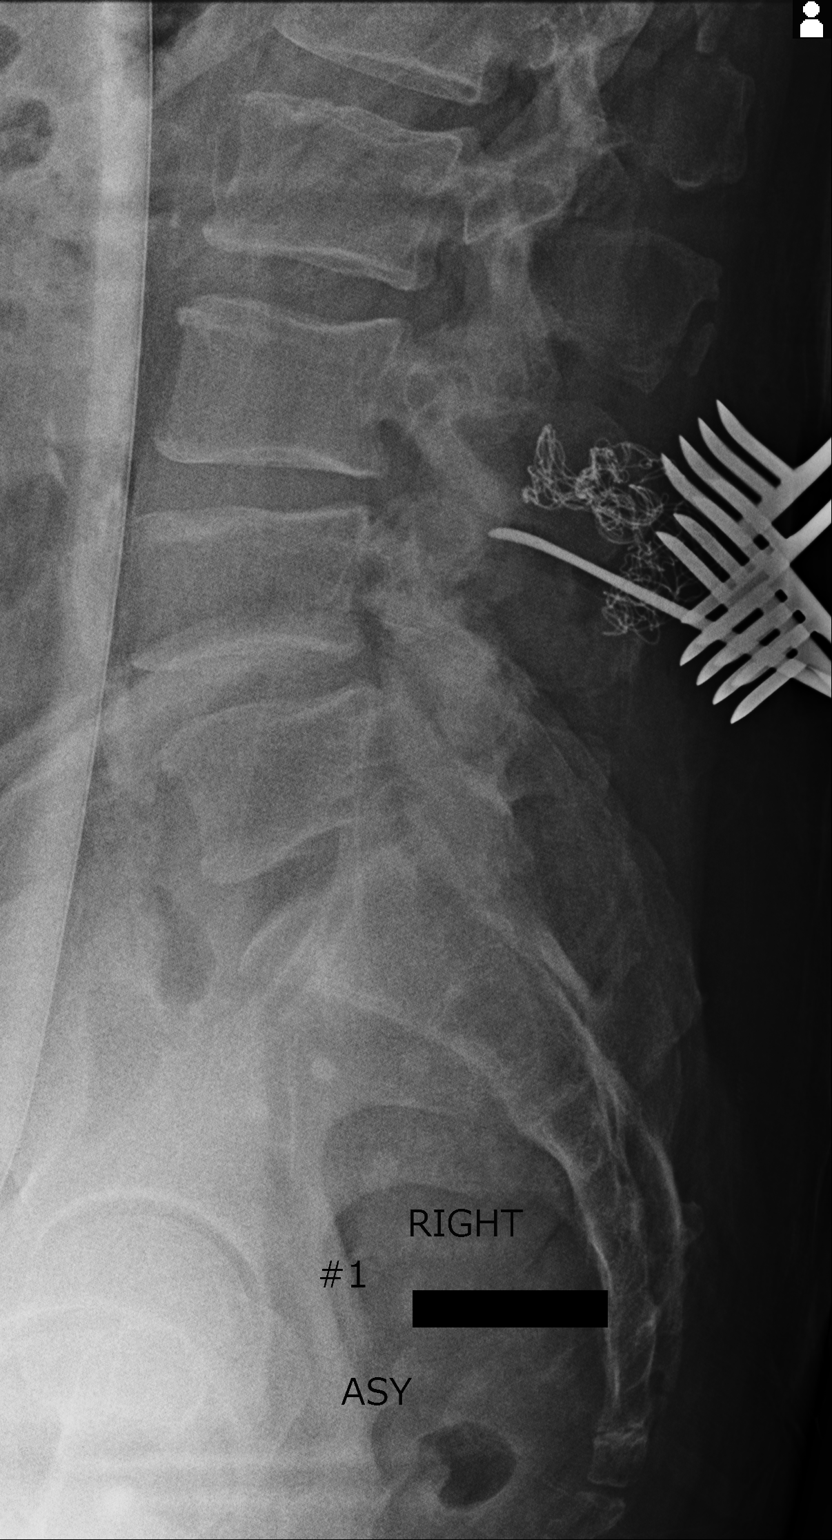

[1 of 1 positions shown; findings below may reference images not displayed]

FINDINGS: Posterior surgical instruments are directed at the L3-4
level.
IMPRESSION: Intraoperative localization as above.

## 2015-10-24 DIAGNOSIS — M25561 Pain in right knee: Secondary | ICD-10-CM | POA: Diagnosis not present

## 2015-10-24 DIAGNOSIS — Z6828 Body mass index (BMI) 28.0-28.9, adult: Secondary | ICD-10-CM | POA: Diagnosis not present

## 2015-11-04 DIAGNOSIS — S46212A Strain of muscle, fascia and tendon of other parts of biceps, left arm, initial encounter: Secondary | ICD-10-CM | POA: Diagnosis not present

## 2015-11-04 DIAGNOSIS — Z9181 History of falling: Secondary | ICD-10-CM | POA: Diagnosis not present

## 2015-11-04 DIAGNOSIS — J449 Chronic obstructive pulmonary disease, unspecified: Secondary | ICD-10-CM | POA: Diagnosis not present

## 2015-11-06 ENCOUNTER — Other Ambulatory Visit: Payer: Self-pay | Admitting: Orthopaedic Surgery

## 2015-11-06 DIAGNOSIS — E1140 Type 2 diabetes mellitus with diabetic neuropathy, unspecified: Secondary | ICD-10-CM | POA: Diagnosis not present

## 2015-11-06 DIAGNOSIS — M25522 Pain in left elbow: Secondary | ICD-10-CM

## 2015-11-06 DIAGNOSIS — M25562 Pain in left knee: Secondary | ICD-10-CM | POA: Diagnosis not present

## 2015-11-06 DIAGNOSIS — M25561 Pain in right knee: Secondary | ICD-10-CM | POA: Diagnosis not present

## 2015-11-06 DIAGNOSIS — S46112A Strain of muscle, fascia and tendon of long head of biceps, left arm, initial encounter: Secondary | ICD-10-CM | POA: Diagnosis not present

## 2015-11-06 DIAGNOSIS — Z139 Encounter for screening, unspecified: Secondary | ICD-10-CM

## 2015-11-07 ENCOUNTER — Other Ambulatory Visit: Payer: Self-pay

## 2015-11-07 ENCOUNTER — Other Ambulatory Visit: Payer: Self-pay | Admitting: Orthopaedic Surgery

## 2015-11-07 ENCOUNTER — Ambulatory Visit
Admission: RE | Admit: 2015-11-07 | Discharge: 2015-11-07 | Disposition: A | Discharge status: Home / Self Care | Payer: Medicare HMO | Source: Ambulatory Visit | Attending: Orthopaedic Surgery | Admitting: Orthopaedic Surgery

## 2015-11-07 ENCOUNTER — Ambulatory Visit
Admission: RE | Admit: 2015-11-07 | Discharge: 2015-11-07 | Disposition: A | Discharge status: Home / Self Care | Payer: Self-pay | Source: Ambulatory Visit | Attending: Orthopaedic Surgery | Admitting: Orthopaedic Surgery

## 2015-11-07 DIAGNOSIS — M25522 Pain in left elbow: Secondary | ICD-10-CM

## 2015-11-07 DIAGNOSIS — Z139 Encounter for screening, unspecified: Secondary | ICD-10-CM

## 2015-11-07 DIAGNOSIS — Z01818 Encounter for other preprocedural examination: Secondary | ICD-10-CM | POA: Diagnosis not present

## 2015-11-11 ENCOUNTER — Ambulatory Visit
Admission: RE | Admit: 2015-11-11 | Discharge: 2015-11-11 | Disposition: A | Discharge status: Home / Self Care | Payer: Medicare HMO | Source: Ambulatory Visit | Attending: Orthopaedic Surgery | Admitting: Orthopaedic Surgery

## 2015-11-11 DIAGNOSIS — M25522 Pain in left elbow: Secondary | ICD-10-CM | POA: Diagnosis not present

## 2015-11-12 ENCOUNTER — Other Ambulatory Visit: Payer: Self-pay

## 2015-11-12 ENCOUNTER — Encounter (HOSPITAL_BASED_OUTPATIENT_CLINIC_OR_DEPARTMENT_OTHER)
Admission: RE | Admit: 2015-11-12 | Discharge: 2015-11-12 | Disposition: A | Discharge status: Home / Self Care | Payer: Medicare HMO | Source: Ambulatory Visit | Attending: Orthopaedic Surgery | Admitting: Orthopaedic Surgery

## 2015-11-12 ENCOUNTER — Other Ambulatory Visit: Payer: Self-pay | Admitting: Orthopaedic Surgery

## 2015-11-12 ENCOUNTER — Encounter (HOSPITAL_BASED_OUTPATIENT_CLINIC_OR_DEPARTMENT_OTHER): Payer: Self-pay | Admitting: *Deleted

## 2015-11-12 DIAGNOSIS — X58XXXA Exposure to other specified factors, initial encounter: Secondary | ICD-10-CM | POA: Diagnosis not present

## 2015-11-12 DIAGNOSIS — M1990 Unspecified osteoarthritis, unspecified site: Secondary | ICD-10-CM | POA: Diagnosis not present

## 2015-11-12 DIAGNOSIS — S46212A Strain of muscle, fascia and tendon of other parts of biceps, left arm, initial encounter: Secondary | ICD-10-CM | POA: Diagnosis not present

## 2015-11-12 DIAGNOSIS — J449 Chronic obstructive pulmonary disease, unspecified: Secondary | ICD-10-CM | POA: Diagnosis not present

## 2015-11-12 DIAGNOSIS — Z7984 Long term (current) use of oral hypoglycemic drugs: Secondary | ICD-10-CM | POA: Diagnosis not present

## 2015-11-12 DIAGNOSIS — Z79899 Other long term (current) drug therapy: Secondary | ICD-10-CM | POA: Diagnosis not present

## 2015-11-12 DIAGNOSIS — E119 Type 2 diabetes mellitus without complications: Secondary | ICD-10-CM | POA: Diagnosis not present

## 2015-11-12 DIAGNOSIS — I10 Essential (primary) hypertension: Secondary | ICD-10-CM | POA: Diagnosis not present

## 2015-11-12 DIAGNOSIS — Z87891 Personal history of nicotine dependence: Secondary | ICD-10-CM | POA: Diagnosis not present

## 2015-11-12 NOTE — Progress Notes (Signed)
Dr. Al Corpus reviewed EKG, repeated EKG, instructed pt to see cardiologist before surgery on Friday if possible. [place] for surgery on Friday.

## 2015-11-12 NOTE — Progress Notes (Signed)
Attempted to make appointment with [name], gave pt's number and MRN to staff, they will call pt to schedule appt. ( informed that Dr. Al Corpus would like for pt to be seen prior to surgery on Friday if possible)

## 2015-11-13 ENCOUNTER — Encounter: Payer: Self-pay | Admitting: Cardiology

## 2015-11-13 ENCOUNTER — Ambulatory Visit (INDEPENDENT_AMBULATORY_CARE_PROVIDER_SITE_OTHER): Payer: Medicare HMO | Admitting: Cardiology

## 2015-11-13 ENCOUNTER — Other Ambulatory Visit: Payer: Self-pay

## 2015-11-13 ENCOUNTER — Ambulatory Visit (HOSPITAL_BASED_OUTPATIENT_CLINIC_OR_DEPARTMENT_OTHER): Payer: Medicare HMO

## 2015-11-13 VITALS — BP 140/82 | HR 64 | Ht 70.0 in | Wt 201.0 lb

## 2015-11-13 DIAGNOSIS — R9431 Abnormal electrocardiogram [ECG] [EKG]: Secondary | ICD-10-CM

## 2015-11-13 DIAGNOSIS — I10 Essential (primary) hypertension: Secondary | ICD-10-CM | POA: Diagnosis not present

## 2015-11-13 DIAGNOSIS — N183 Chronic kidney disease, stage 3 unspecified: Secondary | ICD-10-CM | POA: Insufficient documentation

## 2015-11-13 DIAGNOSIS — Z01810 Encounter for preprocedural cardiovascular examination: Secondary | ICD-10-CM | POA: Diagnosis not present

## 2015-11-13 DIAGNOSIS — S46212A Strain of muscle, fascia and tendon of other parts of biceps, left arm, initial encounter: Secondary | ICD-10-CM | POA: Diagnosis not present

## 2015-11-13 DIAGNOSIS — E1122 Type 2 diabetes mellitus with diabetic chronic kidney disease: Secondary | ICD-10-CM

## 2015-11-13 DIAGNOSIS — I493 Ventricular premature depolarization: Secondary | ICD-10-CM | POA: Diagnosis not present

## 2015-11-13 LAB — ECHOCARDIOGRAM COMPLETE
Ao-asc: 35 cm
CHL CUP MV DEC (S): 216
EERAT: 7.65
EWDT: 216 ms
FS: 28 % (ref 28–44)
HEIGHTINCHES: 70 in
IV/PV OW: 1.09
LA diam index: 1.58 cm/m2
LA vol: 47 mL
LASIZE: 33 mm
LAVOLA4C: 41.7 mL
LAVOLIN: 22.5 mL/m2
LDCA: 4.15 cm2
LEFT ATRIUM END SYS DIAM: 33 mm
LV E/e'average: 7.65
LV PW d: 11.2 mm — AB (ref 0.6–1.1)
LV TDI E'MEDIAL: 10.4
LV e' LATERAL: 12.3 cm/s
LVEEMED: 7.65
LVOT SV: 106 mL
LVOT VTI: 25.5 cm
LVOT diameter: 23 mm
LVOTPV: 108 cm/s
MV Peak grad: 4 mmHg
MV pk A vel: 83.9 m/s
MV pk E vel: 94.1 m/s
TAPSE: 30 mm
TDI e' lateral: 12.3
WEIGHTICAEL: 3216 [oz_av]

## 2015-11-13 LAB — BASIC METABOLIC PANEL
ANION GAP: 9 (ref 5–15)
BUN: 13 mg/dL (ref 6–20)
CALCIUM: 8.9 mg/dL (ref 8.9–10.3)
CO2: 26 mmol/L (ref 22–32)
Chloride: 107 mmol/L (ref 101–111)
Creatinine, Ser: 1.51 mg/dL — ABNORMAL HIGH (ref 0.61–1.24)
GFR calc Af Amer: 54 mL/min — ABNORMAL LOW (ref >60)
GFR, EST NON AFRICAN AMERICAN: 47 mL/min — AB (ref >60)
GLUCOSE: 143 mg/dL — AB (ref 65–99)
POTASSIUM: 4.4 mmol/L (ref 3.5–5.1)
SODIUM: 142 mmol/L (ref 135–145)

## 2015-11-13 NOTE — Progress Notes (Signed)
Cardiology Office Note    Date:  11/13/2015   ID:  Chris Washington, DOB 1950/08/26, MRN [id number]  PCP:  Fae Pippin  Cardiologist:   Candee Furbish, MD     History of Present Illness:  Chris Washington is a 65 y.o. male here for preoperative risk application prior to bicep tendon repair. Has a history of diabetes, COPD, hypertension. He has upcoming surgery on Friday and this appointment was requested by Dr. Al Corpus of anesthesiology prior to surgical management.  Previous surgery includes in 2014 laminectomy.  During his preoperative assessment by anesthesiology, his EKG demonstrated frequent PVCs. He has not had any adverse symptoms from this, no recent syncopal episodes, no chest pain, no shortness of breath, no bleeding, no orthopnea, no PND. He is in quite a bit of pain when trying to lift with his left arm as a result of his bicep tendon.  He works in Architect, Medical laboratory scientific officer, is very active during the day. No exertional anginal symptoms. His son also works with him. He has an adopted 86 year old daughter.  In 2010 syncope none since then.This was in the setting of a very hot day, dehydration at work. He felt it coming on, tried to catch himself. Likely vagal/orthostatic.   In 2005 he underwent a cardiac catheterization by Dr. Melvern Banker. It was normal.   Past Medical History  Diagnosis Date  . Hypertension   . Diabetes mellitus without complication ([place])   . COPD (chronic obstructive pulmonary disease) ([place])   . Arthritis     takes gabapentin for chronic knee pain    Past Surgical History  Procedure Laterality Date  . Cardiac catheterization  05/14/04    normal coronaries, LV systolic function, renals, abdominal aorta (Dr. Myrtice Lauth, [facility])  . Back surgery  2014    lumbar laminectomy    Current Medications: Outpatient Prescriptions Prior to Visit  Medication Sig Dispense Refill  . amLODipine (NORVASC) 5 MG tablet Take 5 mg by mouth daily.    .  budesonide-formoterol (SYMBICORT) 160-4.5 MCG/ACT inhaler Inhale 2 puffs into the lungs 2 (two) times daily as needed.    . gabapentin (NEURONTIN) 300 MG capsule Take 300 mg by mouth 3 (three) times daily.    Marland Kitchen lisinopril (PRINIVIL,ZESTRIL) 20 MG tablet Take 20 mg by mouth daily.    . metFORMIN (GLUCOPHAGE-XR) 500 MG 24 hr tablet Take 1,000 mg by mouth every evening.    . tamsulosin (FLOMAX) 0.4 MG CAPS Take 1 capsule (0.4 mg total) by mouth every morning. 30 capsule 1   No facility-administered medications prior to visit.     Allergies:   Review of patient's allergies indicates no known allergies.   Social History   Social History  . Marital Status: Married    Spouse Name: N/A  . Number of Children: N/A  . Years of Education: N/A   Social History Main Topics  . Smoking status: Former Smoker    Quit date: 05/31/2004  . Smokeless tobacco: None  . Alcohol Use: No  . Drug Use: No  . Sexual Activity: Not Asked   Other Topics Concern  . None   Social History Narrative    Smoked For 45 years  Family History:  Mother's father died of MI when she was 54.    ROS:   Please see the history of present illness.    ROS All other systems reviewed and are negative.   PHYSICAL EXAM:   VS:  BP 140/82 mmHg  Pulse 64  Ht 5\' 10"  (1.778 m)  Wt 201 lb (91.173 kg)  BMI 28.84 kg/m2   GEN: Well nourished, well developed, in no acute distress HEENT: normal Neck: no JVD, carotid bruits, or masses Cardiac: RRR with ectopy; no murmurs, rubs, or gallops,no edema  Respiratory:  Mild wheeze left lung, otherwise clear. Normal work of breathing. GI: soft, nontender, nondistended, + BS MS: Left bicep tendon rupture Skin: warm and dry, no rash Neuro:  Alert and Oriented x 3, Strength and sensation are intact Psych: euthymic mood, full affect  Wt Readings from Last 3 Encounters:  11/13/15 201 lb (91.173 kg)  11/12/15 190 lb (86.183 kg)  12/26/12 205 lb (92.987 kg)      Studies/Labs  Reviewed:   EKG:  11/12/15-sinus rhythm with frequent PVCs and bigeminal pattern. Otherwise unremarkable. PVCs are new when compared to prior EKG.  Recent Labs: 11/12/2015: BUN 13; Creatinine, Ser 1.51*; Potassium 4.4; Sodium 142   Lipid Panel No results found for: CHOL, TRIG, HDL, CHOLHDL, VLDL, LDLCALC, LDLDIRECT  Additional studies/ records that were reviewed today include:   Cardiac catheterization 2005-normal    ASSESSMENT:    1. ECG abnormal   2. Pre-operative cardiovascular examination   3. PVC's (premature ventricular contractions)   4. Essential hypertension   5. Controlled type 2 diabetes mellitus with stage 3 chronic kidney disease, without long-term current use of insulin (HCC)      PLAN:  In order of problems listed above:  Preoperative risk stratification prior to bicep tendon repair  - Abnormal EKG noted. Frequent PVCs and bigeminal pattern.  - I will check an echocardiogram today to ensure that he does not have any evidence of cardiomyopathy underlying.  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 Also, he was able to survive surgery in 2014 without any difficulty.  - No high-risk symptoms as recent syncope (I do believe that in 2010 his syncopal episode had prodrome and was classic for dehydration, hypotension)  - Denies any anginal symptoms, no chest pain, no shortness of breath. Occasionally he will take Symbicort with wheezing.  - He also is eager to have upcoming knee surgery as well.  Former smoker  - Quit in 2006. Smoked for 45 years.  - Left lung wheeze appreciated on exam. Occasional Symbicort.  Abnormal EKG  - Frequent PVCs noted.  - Usually PVCs are benign. Checking echocardiogram given the frequency of PVCs.  - If PVC burden is greater than 10-15 thousand in day, this can lead to  cardiomyopathy.  - Bigeminy PVC pattern may lead to low affective pulse. However, there is no indication for pacemaker.  Diabetes  - On metformin. Per primary.  - Consider statin therapy.  Essential hypertension  - Usually well controlled, he states that today it is higher than usual.  - Medications reviewed.  Chronic kidney disease stage III  - Creatinine 1.5. ACE inhibitor with diabetes. Monitor.    Medication Adjustments/Labs and Tests Ordered: Current medicines are reviewed at length with the patient today.  Concerns regarding medicines are outlined above.  Medication changes, Labs and Tests ordered today are listed in the Patient Instructions below. Patient Instructions  Medication Instructions:  The current medical regimen is effective;  continue present plan and medications.  Testing/Procedures: Your physician has requested that you have an echocardiogram. Echocardiography is a painless test that  uses sound waves to create images of your heart. It provides your doctor with information about the size and shape of your heart and how well your heart's chambers and valves are working. This procedure takes approximately one hour. There are no restrictions for this procedure.  Follow-Up: As needed after testing.  Thank you for choosing [facility]!!           Signed, Candee Furbish, MD  11/13/2015 11:20 AM    [address], [place], [place]  [address] Phone: [phone]; Fax: [phone]

## 2015-11-13 NOTE — Patient Instructions (Signed)
Medication Instructions:  The current medical regimen is effective;  continue present plan and medications.  Testing/Procedures: Your physician has requested that you have an echocardiogram. Echocardiography is a painless test that uses sound waves to create images of your heart. It provides your doctor with information about the size and shape of your heart and how well your heart's chambers and valves are working. This procedure takes approximately one hour. There are no restrictions for this procedure.  Follow-Up: As needed after testing.  Thank you for choosing [facility]!!

## 2015-11-13 NOTE — Progress Notes (Signed)
High creatine cleared by dr Gifford Shave

## 2015-11-14 ENCOUNTER — Ambulatory Visit (HOSPITAL_BASED_OUTPATIENT_CLINIC_OR_DEPARTMENT_OTHER)
Admission: RE | Admit: 2015-11-14 | Discharge: 2015-11-14 | Disposition: A | Discharge status: Home / Self Care | Payer: Medicare HMO | Source: Ambulatory Visit | Attending: Orthopaedic Surgery | Admitting: Orthopaedic Surgery

## 2015-11-14 ENCOUNTER — Ambulatory Visit (HOSPITAL_BASED_OUTPATIENT_CLINIC_OR_DEPARTMENT_OTHER): Payer: Medicare HMO | Admitting: Certified Registered"

## 2015-11-14 ENCOUNTER — Encounter (HOSPITAL_BASED_OUTPATIENT_CLINIC_OR_DEPARTMENT_OTHER): Payer: Self-pay | Admitting: *Deleted

## 2015-11-14 ENCOUNTER — Encounter (HOSPITAL_BASED_OUTPATIENT_CLINIC_OR_DEPARTMENT_OTHER)
Admission: RE | Disposition: A | Discharge status: Home / Self Care | Payer: Self-pay | Source: Ambulatory Visit | Attending: Orthopaedic Surgery

## 2015-11-14 ENCOUNTER — Ambulatory Visit (HOSPITAL_COMMUNITY): Payer: Medicare HMO

## 2015-11-14 DIAGNOSIS — E119 Type 2 diabetes mellitus without complications: Secondary | ICD-10-CM | POA: Diagnosis not present

## 2015-11-14 DIAGNOSIS — Z7984 Long term (current) use of oral hypoglycemic drugs: Secondary | ICD-10-CM | POA: Insufficient documentation

## 2015-11-14 DIAGNOSIS — Z87891 Personal history of nicotine dependence: Secondary | ICD-10-CM | POA: Insufficient documentation

## 2015-11-14 DIAGNOSIS — X58XXXA Exposure to other specified factors, initial encounter: Secondary | ICD-10-CM | POA: Insufficient documentation

## 2015-11-14 DIAGNOSIS — S46212A Strain of muscle, fascia and tendon of other parts of biceps, left arm, initial encounter: Secondary | ICD-10-CM | POA: Diagnosis not present

## 2015-11-14 DIAGNOSIS — J449 Chronic obstructive pulmonary disease, unspecified: Secondary | ICD-10-CM | POA: Diagnosis not present

## 2015-11-14 DIAGNOSIS — M79622 Pain in left upper arm: Secondary | ICD-10-CM | POA: Diagnosis not present

## 2015-11-14 DIAGNOSIS — I10 Essential (primary) hypertension: Secondary | ICD-10-CM | POA: Insufficient documentation

## 2015-11-14 DIAGNOSIS — S46112A Strain of muscle, fascia and tendon of long head of biceps, left arm, initial encounter: Secondary | ICD-10-CM

## 2015-11-14 DIAGNOSIS — M1990 Unspecified osteoarthritis, unspecified site: Secondary | ICD-10-CM | POA: Insufficient documentation

## 2015-11-14 DIAGNOSIS — Z79899 Other long term (current) drug therapy: Secondary | ICD-10-CM | POA: Insufficient documentation

## 2015-11-14 DIAGNOSIS — M795 Residual foreign body in soft tissue: Secondary | ICD-10-CM | POA: Diagnosis not present

## 2015-11-14 DIAGNOSIS — S46219A Strain of muscle, fascia and tendon of other parts of biceps, unspecified arm, initial encounter: Secondary | ICD-10-CM

## 2015-11-14 DIAGNOSIS — S46112D Strain of muscle, fascia and tendon of long head of biceps, left arm, subsequent encounter: Secondary | ICD-10-CM | POA: Diagnosis not present

## 2015-11-14 DIAGNOSIS — G8918 Other acute postprocedural pain: Secondary | ICD-10-CM | POA: Diagnosis not present

## 2015-11-14 HISTORY — DX: Unspecified osteoarthritis, unspecified site: M19.90

## 2015-11-14 HISTORY — PX: DISTAL BICEPS TENDON REPAIR: SHX1461

## 2015-11-14 LAB — GLUCOSE, CAPILLARY
Glucose-Capillary: 139 mg/dL — ABNORMAL HIGH (ref 65–99)
Glucose-Capillary: 169 mg/dL — ABNORMAL HIGH (ref 65–99)

## 2015-11-14 SURGERY — REPAIR, TENDON, BICEPS, DISTAL
Anesthesia: Regional | Site: Elbow | Laterality: Left

## 2015-11-14 MED ORDER — LIDOCAINE HCL (CARDIAC) 20 MG/ML IV SOLN
INTRAVENOUS | Status: DC | PRN
  Administered 2015-11-14: 30 mg via INTRAVENOUS

## 2015-11-14 MED ORDER — OXYCODONE HCL 5 MG/5ML PO SOLN: 5.0000 mg | Freq: Once | ORAL | Status: DC | PRN

## 2015-11-14 MED ORDER — METHOCARBAMOL 750 MG PO TABS: 750.0000 mg | ORAL_TABLET | Freq: Two times a day (BID) | ORAL | Status: DC | PRN

## 2015-11-14 MED ORDER — ACETAMINOPHEN 325 MG PO TABS: 325.0000 mg | ORAL_TABLET | ORAL | Status: DC | PRN

## 2015-11-14 MED ORDER — SCOPOLAMINE 1 MG/3DAYS TD PT72: 1.0000 | MEDICATED_PATCH | Freq: Once | TRANSDERMAL | Status: DC | PRN

## 2015-11-14 MED ORDER — BUPIVACAINE-EPINEPHRINE (PF) 0.25% -1:200000 IJ SOLN
INTRAMUSCULAR | Status: AC
  Filled 2015-11-14: qty 60

## 2015-11-14 MED ORDER — LACTATED RINGERS IV SOLN
INTRAVENOUS | Status: DC
  Administered 2015-11-14 (×2): via INTRAVENOUS

## 2015-11-14 MED ORDER — EPHEDRINE SULFATE 50 MG/ML IJ SOLN
INTRAMUSCULAR | Status: DC | PRN
  Administered 2015-11-14 (×2): 10 mg via INTRAVENOUS

## 2015-11-14 MED ORDER — FENTANYL CITRATE (PF) 100 MCG/2ML IJ SOLN
50.0000 ug | INTRAMUSCULAR | Status: DC | PRN
  Administered 2015-11-14: 50 ug via INTRAVENOUS

## 2015-11-14 MED ORDER — ONDANSETRON HCL 4 MG PO TABS: 4.0000 mg | ORAL_TABLET | Freq: Three times a day (TID) | ORAL | Status: DC | PRN

## 2015-11-14 MED ORDER — GLYCOPYRROLATE 0.2 MG/ML IJ SOLN: 0.2000 mg | Freq: Once | INTRAMUSCULAR | Status: DC | PRN

## 2015-11-14 MED ORDER — ONDANSETRON HCL 4 MG/2ML IJ SOLN
INTRAMUSCULAR | Status: AC
  Filled 2015-11-14: qty 2

## 2015-11-14 MED ORDER — HYDROCODONE-ACETAMINOPHEN 7.5-325 MG PO TABS: 1.0000 | ORAL_TABLET | Freq: Four times a day (QID) | ORAL | Status: DC | PRN

## 2015-11-14 MED ORDER — PROPOFOL 500 MG/50ML IV EMUL
INTRAVENOUS | Status: AC
  Filled 2015-11-14: qty 50

## 2015-11-14 MED ORDER — BUPIVACAINE-EPINEPHRINE (PF) 0.5% -1:200000 IJ SOLN
INTRAMUSCULAR | Status: AC
  Filled 2015-11-14: qty 30

## 2015-11-14 MED ORDER — MIDAZOLAM HCL 2 MG/2ML IJ SOLN
INTRAMUSCULAR | Status: AC
  Filled 2015-11-14: qty 2

## 2015-11-14 MED ORDER — ONDANSETRON HCL 4 MG/2ML IJ SOLN
INTRAMUSCULAR | Status: DC | PRN
  Administered 2015-11-14: 4 mg via INTRAVENOUS

## 2015-11-14 MED ORDER — FENTANYL CITRATE (PF) 100 MCG/2ML IJ SOLN
INTRAMUSCULAR | Status: AC
  Filled 2015-11-14: qty 2

## 2015-11-14 MED ORDER — CEFAZOLIN SODIUM-DEXTROSE 2-4 GM/100ML-% IV SOLN
2.0000 g | INTRAVENOUS | Status: AC
  Administered 2015-11-14: 2 g via INTRAVENOUS

## 2015-11-14 MED ORDER — OXYCODONE HCL 5 MG PO TABS: 5.0000 mg | ORAL_TABLET | Freq: Once | ORAL | Status: DC | PRN

## 2015-11-14 MED ORDER — BUPIVACAINE HCL (PF) 0.25 % IJ SOLN
INTRAMUSCULAR | Status: AC
  Filled 2015-11-14: qty 30

## 2015-11-14 MED ORDER — CEFAZOLIN SODIUM-DEXTROSE 2-4 GM/100ML-% IV SOLN
INTRAVENOUS | Status: AC
  Filled 2015-11-14: qty 100

## 2015-11-14 MED ORDER — LIDOCAINE HCL (PF) 1 % IJ SOLN
INTRAMUSCULAR | Status: AC
  Filled 2015-11-14: qty 60

## 2015-11-14 MED ORDER — FENTANYL CITRATE (PF) 100 MCG/2ML IJ SOLN: 25.0000 ug | INTRAMUSCULAR | Status: DC | PRN

## 2015-11-14 MED ORDER — MIDAZOLAM HCL 2 MG/2ML IJ SOLN
1.0000 mg | INTRAMUSCULAR | Status: DC | PRN
  Administered 2015-11-14: 2 mg via INTRAVENOUS

## 2015-11-14 MED ORDER — SENNOSIDES-DOCUSATE SODIUM 8.6-50 MG PO TABS: 1.0000 | ORAL_TABLET | Freq: Every evening | ORAL | Status: DC | PRN

## 2015-11-14 MED ORDER — ACETAMINOPHEN 160 MG/5ML PO SOLN: 325.0000 mg | ORAL | Status: DC | PRN

## 2015-11-14 MED ORDER — DEXAMETHASONE SODIUM PHOSPHATE 10 MG/ML IJ SOLN
INTRAMUSCULAR | Status: DC | PRN
  Administered 2015-11-14: 10 mg via INTRAVENOUS

## 2015-11-14 MED ORDER — 0.9 % SODIUM CHLORIDE (POUR BTL) OPTIME
TOPICAL | Status: DC | PRN
  Administered 2015-11-14: 2000 mL

## 2015-11-14 MED ORDER — BUPIVACAINE-EPINEPHRINE (PF) 0.5% -1:200000 IJ SOLN
INTRAMUSCULAR | Status: DC | PRN
  Administered 2015-11-14: 30 mL via PERINEURAL

## 2015-11-14 MED ORDER — DEXAMETHASONE SODIUM PHOSPHATE 10 MG/ML IJ SOLN
INTRAMUSCULAR | Status: AC
  Filled 2015-11-14: qty 1

## 2015-11-14 MED ORDER — PROPOFOL 10 MG/ML IV BOLUS
INTRAVENOUS | Status: DC | PRN
  Administered 2015-11-14: 200 mg via INTRAVENOUS

## 2015-11-14 SURGICAL SUPPLY — 68 items
BANDAGE ACE 4X5 VEL STRL LF (GAUZE/BANDAGES/DRESSINGS) ×2 IMPLANT
BANDAGE ACE 6X5 VEL STRL LF (GAUZE/BANDAGES/DRESSINGS) ×2 IMPLANT
BANDAGE COBAN STERILE 2 (GAUZE/BANDAGES/DRESSINGS) ×2 IMPLANT
BLADE SURG 15 STRL LF DISP TIS (BLADE) ×2 IMPLANT
BLADE SURG 15 STRL SS (BLADE) ×2
BNDG ESMARK 4X9 LF (GAUZE/BANDAGES/DRESSINGS) ×2 IMPLANT
CORDS BIPOLAR (ELECTRODE) ×2 IMPLANT
COVER BACK TABLE 60X90IN (DRAPES) ×2 IMPLANT
CUFF TOURN SGL LL 18 NRW (TOURNIQUET CUFF) ×2 IMPLANT
CUFF TOURNIQUET SINGLE 18IN (TOURNIQUET CUFF) IMPLANT
CUFF TOURNIQUET SINGLE 24IN (TOURNIQUET CUFF) IMPLANT
DECANTER SPIKE VIAL GLASS SM (MISCELLANEOUS) IMPLANT
DRAPE C-ARM 42X72 X-RAY (DRAPES) ×2 IMPLANT
DRAPE EXTREMITY T 121X128X90 (DRAPE) ×2 IMPLANT
DRAPE IMP U-DRAPE 54X76 (DRAPES) ×2 IMPLANT
DRAPE OEC MINIVIEW 54X84 (DRAPES) IMPLANT
DRAPE SURG 17X23 STRL (DRAPES) ×2 IMPLANT
DRAPE U-SHAPE 47X51 STRL (DRAPES) IMPLANT
DURAPREP 26ML APPLICATOR (WOUND CARE) ×4 IMPLANT
GAUZE SPONGE 4X4 12PLY STRL (GAUZE/BANDAGES/DRESSINGS) ×2 IMPLANT
GLOVE BIO SURGEON STRL SZ 6.5 (GLOVE) ×6 IMPLANT
GLOVE BIOGEL PI IND STRL 7.0 (GLOVE) ×3 IMPLANT
GLOVE BIOGEL PI INDICATOR 7.0 (GLOVE) ×3
GLOVE SKINSENSE NS SZ7.5 (GLOVE) ×1
GLOVE SKINSENSE STRL SZ7.5 (GLOVE) ×1 IMPLANT
GLOVE SURG SYN 7.5  E (GLOVE) ×1
GLOVE SURG SYN 7.5 E (GLOVE) ×1 IMPLANT
GOWN STRL REIN XL XLG (GOWN DISPOSABLE) ×2 IMPLANT
GOWN STRL REUS W/ TWL LRG LVL3 (GOWN DISPOSABLE) ×1 IMPLANT
GOWN STRL REUS W/TWL LRG LVL3 (GOWN DISPOSABLE) ×1
LIQUID BAND (GAUZE/BANDAGES/DRESSINGS) IMPLANT
NDL SUT 6 .5 CRC .975X.05 MAYO (NEEDLE) ×1 IMPLANT
NEEDLE MAYO TAPER (NEEDLE) ×1
NEEDLE PRECISIONGLIDE 27X1.5 (NEEDLE) IMPLANT
NS IRRIG 1000ML POUR BTL (IV SOLUTION) ×2 IMPLANT
PACK BASIN DAY SURGERY FS (CUSTOM PROCEDURE TRAY) ×2 IMPLANT
PAD CAST 4YDX4 CTTN HI CHSV (CAST SUPPLIES) ×2 IMPLANT
PADDING CAST COTTON 4X4 STRL (CAST SUPPLIES) ×2
PADDING CAST SYNTHETIC 3 NS LF (CAST SUPPLIES)
PADDING CAST SYNTHETIC 3X4 NS (CAST SUPPLIES) IMPLANT
PADDING CAST SYNTHETIC 4 (CAST SUPPLIES) ×1
PADDING CAST SYNTHETIC 4X4 STR (CAST SUPPLIES) ×1 IMPLANT
SCREW PEEK TENODESIS 5.5X15M (Screw) ×2 IMPLANT
SLEEVE SCD COMPRESS KNEE MED (MISCELLANEOUS) ×2 IMPLANT
SLEEVE SURGEON STRL (DRAPES) ×4 IMPLANT
SLING ARM FOAM STRAP LRG (SOFTGOODS) ×2 IMPLANT
SPLINT FIBERGLASS 3X35 (CAST SUPPLIES) IMPLANT
SPLINT FIBERGLASS 4X30 (CAST SUPPLIES) ×2 IMPLANT
SPONGE LAP 18X18 X RAY DECT (DISPOSABLE) ×2 IMPLANT
STOCKINETTE 4X48 STRL (DRAPES) ×2 IMPLANT
SUT 2 FIBERLOOP 20 STRT BLUE (SUTURE)
SUT FIBERWIRE #2 38 T-5 BLUE (SUTURE)
SUT MNCRL AB 4-0 PS2 18 (SUTURE) IMPLANT
SUT SILK 0 TIES 10X30 (SUTURE) ×2 IMPLANT
SUT VIC AB 2-0 SH 27 (SUTURE) ×2
SUT VIC AB 2-0 SH 27XBRD (SUTURE) ×2 IMPLANT
SUT VIC AB 3-0 CT1 27 (SUTURE)
SUT VIC AB 3-0 CT1 27XBRD (SUTURE) IMPLANT
SUT VIC AB 3-0 PS1 18 (SUTURE) ×1
SUT VIC AB 3-0 PS1 18XBRD (SUTURE) ×1 IMPLANT
SUTURE 2 FIBERLOOP 20 STRT BLU (SUTURE) IMPLANT
SUTURE FIBERWR #2 38 T-5 BLUE (SUTURE) IMPLANT
SYR BULB 3OZ (MISCELLANEOUS) ×2 IMPLANT
SYR CONTROL 10ML LL (SYRINGE) IMPLANT
SYSTEM ARTHRO FOR DISTAL BICEP (Orthopedic Implant) ×2 IMPLANT
TOWEL OR 17X24 6PK STRL BLUE (TOWEL DISPOSABLE) ×4 IMPLANT
TOWEL OR NON WOVEN STRL DISP B (DISPOSABLE) ×2 IMPLANT
UNDERPAD 30X30 (UNDERPADS AND DIAPERS) ×2 IMPLANT

## 2015-11-14 NOTE — Progress Notes (Signed)
Assisted Dr. Moser with left, ultrasound guided, supraclavicular block. Side rails up, monitors on throughout procedure. See vital signs in flow sheet. Tolerated Procedure well. 

## 2015-11-14 NOTE — Anesthesia Preprocedure Evaluation (Addendum)
Anesthesia Evaluation  Patient identified by MRN, date of birth, ID band Patient awake    Reviewed: Allergy & Precautions, NPO status , Patient's Chart, lab work & pertinent test results  History of Anesthesia Complications Negative for: history of anesthetic complications  Airway Mallampati: II  TM Distance: >3 FB Neck ROM: Full    Dental  (+) Upper Dentures, Lower Dentures   Pulmonary neg shortness of breath, neg sleep apnea, COPD,  COPD inhaler, neg recent URI, former smoker,    breath sounds clear to auscultation       Cardiovascular hypertension, Pt. on medications (-) angina(-) CHF  Rhythm:Regular     Neuro/Psych negative neurological ROS  negative psych ROS   GI/Hepatic negative GI ROS, Neg liver ROS,   Endo/Other  diabetes, Type 2, Oral Hypoglycemic Agents  Renal/GU Renal InsufficiencyRenal disease     Musculoskeletal  (+) Arthritis ,   Abdominal   Peds  Hematology negative hematology ROS (+)   Anesthesia Other Findings   Reproductive/Obstetrics                            Anesthesia Physical Anesthesia Plan  ASA: II  Anesthesia Plan: General and Regional   Post-op Pain Management:    Induction: Intravenous  Airway Management Planned: LMA  Additional Equipment: None  Intra-op Plan:   Post-operative Plan: Extubation in OR  Informed Consent: I have reviewed the patients History and Physical, chart, labs and discussed the procedure including the risks, benefits and alternatives for the proposed anesthesia with the patient or authorized representative who has indicated his/her understanding and acceptance.   Dental advisory given  Plan Discussed with: CRNA and Surgeon  Anesthesia Plan Comments:         Anesthesia Quick Evaluation

## 2015-11-14 NOTE — Anesthesia Postprocedure Evaluation (Signed)
Anesthesia Post Note  Patient: Chris Washington  Procedure(s) Performed: Procedure(s) (LRB): LEFT DISTAL BICEPS TENDON REPAIR (Left)  Patient location during evaluation: PACU Anesthesia Type: General and Regional Level of consciousness: awake Pain management: pain level controlled Vital Signs Assessment: post-procedure vital signs reviewed and stable Respiratory status: spontaneous breathing Cardiovascular status: stable Postop Assessment: no signs of nausea or vomiting Anesthetic complications: no    Last Vitals:  Filed Vitals:   11/14/15 1115 11/14/15 1151  BP: 135/73 152/84  Pulse: 71 70  Temp:  36.4 C  Resp: 23 18    Last Pain:  Filed Vitals:   11/14/15 1153  PainSc: 0-No pain                 Bellamarie Pflug

## 2015-11-14 NOTE — H&P (Signed)
    PREOPERATIVE H&P  Chief Complaint: Left distal biceps tendon tear  HPI: Chris Washington is a 65 y.o. male who presents for surgical treatment of Left distal biceps tendon tear.  He denies any changes in medical history.  Past Medical History  Diagnosis Date  . Hypertension   . Diabetes mellitus without complication ([place])   . COPD (chronic obstructive pulmonary disease) ([place])   . Arthritis     takes gabapentin for chronic knee pain   Past Surgical History  Procedure Laterality Date  . Cardiac catheterization  05/14/04    normal coronaries, LV systolic function, renals, abdominal aorta (Dr. Myrtice Lauth, [facility])  . Back surgery  2014    lumbar laminectomy   Social History   Social History  . Marital Status: Married    Spouse Name: N/A  . Number of Children: N/A  . Years of Education: N/A   Social History Main Topics  . Smoking status: Former Smoker    Quit date: 05/31/2004  . Smokeless tobacco: None  . Alcohol Use: No  . Drug Use: No  . Sexual Activity: Not Asked   Other Topics Concern  . None   Social History Narrative   History reviewed. No pertinent family history. No Known Allergies Prior to Admission medications   Medication Sig Start Date End Date Taking? Authorizing Provider  amLODipine (NORVASC) 5 MG tablet Take 5 mg by mouth daily.   Yes Historical Provider, MD  budesonide-formoterol (SYMBICORT) 160-4.5 MCG/ACT inhaler Inhale 2 puffs into the lungs 2 (two) times daily as needed.   Yes Historical Provider, MD  gabapentin (NEURONTIN) 300 MG capsule Take 300 mg by mouth 3 (three) times daily.   Yes Historical Provider, MD  lisinopril (PRINIVIL,ZESTRIL) 20 MG tablet Take 20 mg by mouth daily.   Yes Historical Provider, MD  metFORMIN (GLUCOPHAGE-XR) 500 MG 24 hr tablet Take 1,000 mg by mouth every evening.   Yes Historical Provider, MD  tamsulosin (FLOMAX) 0.4 MG CAPS Take 1 capsule (0.4 mg total) by mouth every morning. 12/29/12  Yes Newman Pies, MD      Positive ROS: All other systems have been reviewed and were otherwise negative with the exception of those mentioned in the HPI and as above.  Physical Exam: General: Alert, no acute distress Cardiovascular: No pedal edema Respiratory: No cyanosis, no use of accessory musculature GI: abdomen soft Skin: No lesions in the area of chief complaint Neurologic: Sensation intact distally Psychiatric: Patient is competent for consent with normal mood and affect Lymphatic: no lymphedema  MUSCULOSKELETAL: exam stable  Assessment: Left distal biceps tendon tear  Plan: Plan for Procedure(s): LEFT DISTAL BICEPS TENDON REPAIR  The risks benefits and alternatives were discussed with the patient including but not limited to the risks of nonoperative treatment, versus surgical intervention including infection, bleeding, nerve injury,  blood clots, cardiopulmonary complications, morbidity, mortality, among others, and they were willing to proceed.   Marianna Payment, MD   11/14/2015 8:32 AM

## 2015-11-14 NOTE — Transfer of Care (Signed)
Immediate Anesthesia Transfer of Care Note  Patient: Chris Washington  Procedure(s) Performed: Procedure(s): LEFT DISTAL BICEPS TENDON REPAIR (Left)  Patient Location: PACU  Anesthesia Type:GA combined with regional for post-op pain  Level of Consciousness: awake and patient cooperative  Airway & Oxygen Therapy: Patient Spontanous Breathing and Patient connected to face mask oxygen  Post-op Assessment: Report given to RN and Post -op Vital signs reviewed and stable  Post vital signs: Reviewed and stable  Last Vitals:  Filed Vitals:   11/14/15 1054 11/14/15 1055  BP:    Pulse: 67 68  Temp:    Resp:  19    Last Pain:  Filed Vitals:   11/14/15 1056  PainSc: 5       Patients Stated Pain Goal: 2 ([id number])  Complications: No apparent anesthesia complications

## 2015-11-14 NOTE — Anesthesia Procedure Notes (Addendum)
Anesthesia Regional Block:  Supraclavicular block  Pre-Anesthetic Checklist: ,, timeout performed, Correct Patient, Correct Site, Correct Laterality, Correct Procedure, Correct Position, site marked, Risks and benefits discussed,  Surgical consent,  Pre-op evaluation,  At surgeon's request and post-op pain management  Laterality: Upper and Left  Prep: chloraprep       Needles:  Injection technique: Single-shot  Needle Type: Echogenic Needle          Additional Needles:  Procedures: ultrasound guided (picture in chart) Supraclavicular block Narrative:  Injection made incrementally with aspirations every 5 mL.  Performed by: Personally   Additional Notes: H+P and labs reviewed, risks and benefits discussed with patient, procedure tolerated well without complications   Procedure Name: LMA Insertion Date/Time: 11/14/2015 8:55 AM Performed by: Chamara Dyck D Pre-anesthesia Checklist: Patient identified, Emergency Drugs available, Suction available and Patient being monitored Patient Re-evaluated:Patient Re-evaluated prior to inductionOxygen Delivery Method: Circle system utilized Preoxygenation: Pre-oxygenation with 100% oxygen Intubation Type: IV induction Ventilation: Mask ventilation without difficulty LMA: LMA inserted LMA Size: 4.0 Number of attempts: 1 Airway Equipment and Method: Bite block Placement Confirmation: positive ETCO2 Tube secured with: Tape Dental Injury: Teeth and Oropharynx as per pre-operative assessment

## 2015-11-14 NOTE — Op Note (Signed)
   Date of Surgery: 11/14/2015  INDICATIONS: Mr. Armelin is a 65 y.o.-year-old male with a left distal biceps rupture;  The patient did consent to the procedure after discussion of the risks and benefits.  PREOPERATIVE DIAGNOSIS: Left distal biceps rupture  POSTOPERATIVE DIAGNOSIS: Same.  PROCEDURE: Repair of left distal biceps rupture  SURGEON: N. Eduard Roux, M.D.  ASSIST: None.  ANESTHESIA:  general, regional  IV FLUIDS AND URINE: See anesthesia.  ESTIMATED BLOOD LOSS: Minimal mL.  IMPLANTS: Arthrex foot button and interference screw  DRAINS: None  COMPLICATIONS: None.  DESCRIPTION OF PROCEDURE: The patient was brought to the operating room and placed supine on the operating table.  The patient had been signed prior to the procedure and this was documented. The patient had the anesthesia placed by the anesthesiologist.  A time-out was performed to confirm that this was the correct patient, site, side and location. The patient did receive antibiotics prior to the incision and was re-dosed during the procedure as needed at indicated intervals.  A tourniquet was placed.  The patient had the operative extremity prepped and draped in the standard surgical fashion.    A 5 cm longitudinal incision between the pronator teres and brachioradialis was created. Blunt dissection was performed. Branches of the lateral antebrachial cutaneous nerve were identified and mobilized. Perforating veins were ligated and mobilized. Blunt dissection was taken down to the radial tuberosity. Retractors were placed. The distal stump of the biceps tendon was identified. The tendon was trimmed down to an 8 mm diameter. The tendon was then whipstitched with #2 FiberWire. We then drilled a bicortical 3 mm hole through the radial tuberosity using fluoroscopic guidance. I then drilled a 8 mm unicortical hole over the guidepin into the radial tuberosity. The sutures were then asked through the foot button and the foot button  was passed through the radial tuberosity and flipped on the opposite surface of the radial tuberosity. This was confirmed under fluoroscopy. The tendon was then advanced down into the hole created in the radial tuberosity. Once it was appropriately tension the tendon was tied down. For additional fixation we placed a 5.5 mm Bio-Tenodesis screw into the 8 millimeter hole drilled in the radial tuberosity.  Final x-rays were taken. The wound was thoroughly irrigated. The tourniquet was deflated. Hemostasis was achieved. The wound was closed in a layered fashion using 3-0 Vicryl and 3-0 nylon. Sterile dressings were applied. Arm was placed in a posterior splint. Patient tolerated the procedure well and had no immediate complications.  POSTOPERATIVE PLAN: Patient will be discharged home of her in follow-up in 2 weeks for suture removal.  Azucena Cecil, MD [place] 10:55 AM

## 2015-11-14 NOTE — Discharge Instructions (Signed)
Postoperative instructions:  Weightbearing: non weight bearing  Dressing instructions: Keep your dressing and/or splint clean and dry at all times.  It will be removed at your first post-operative appointment.  Your stitches and/or staples will be removed at this visit.  Incision instructions:  Do not soak your incision for 3 weeks after surgery.  If the incision gets wet, pat dry and do not scrub the incision.  Pain control:  You have been given a prescription to be taken as directed for post-operative pain control.  In addition, elevate the operative extremity above the heart at all times to prevent swelling and throbbing pain.  Take over-the-counter Colace, 100mg  by mouth twice a day while taking narcotic pain medications to help prevent constipation.  Follow up appointments: 1) 10-14 days for suture removal and wound check. 2) Dr. Erlinda Hong as scheduled.   -------------------------------------------------------------------------------------------------------------  After Surgery Pain Control:  After your surgery, post-surgical discomfort or pain is likely. This discomfort can last several days to a few weeks. At certain times of the day your discomfort may be more intense.  Did you receive a nerve block?  A nerve block can provide pain relief for one hour to two days after your surgery. As long as the nerve block is working, you will experience little or no sensation in the area the surgeon operated on.  As the nerve block wears off, you will begin to experience pain or discomfort. It is very important that you begin taking your prescribed pain medication before the nerve block fully wears off. Treating your pain at the first sign of the block wearing off will ensure your pain is better controlled and more tolerable when full-sensation returns. Do not wait until the pain is intolerable, as the medicine will be less effective. It is better to treat pain in advance than to try and catch up.    General Anesthesia:  If you did not receive a nerve block during your surgery, you will need to start taking your pain medication shortly after your surgery and should continue to do so as prescribed by your surgeon.  Pain Medication:  Most commonly we prescribe Vicodin and Percocet for post-operative pain. Both of these medications contain a combination of acetaminophen (Tylenol) and a narcotic to help control pain.   It takes between 30 and 45 minutes before pain medication starts to work. It is important to take your medication before your pain level gets too intense.   Nausea is a common side effect of many pain medications. You will want to eat something before taking your pain medicine to help prevent nausea.   If you are taking a prescription pain medication that contains acetaminophen, we recommend that you do not take additional over the counter acetaminophen (Tylenol).  Other pain relieving options:   Using a cold pack to ice the affected area a few times a day (15 to 20 minutes at a time) can help to relieve pain, reduce swelling and bruising.   Elevation of the affected area can also help to reduce pain and swelling.   \    Post Anesthesia Home Care Instructions  Activity: Get plenty of rest for the remainder of the day. A responsible adult should stay with you for 24 hours following the procedure.  For the next 24 hours, DO NOT: -Drive a car -Paediatric nurse -Drink alcoholic beverages -Take any medication unless instructed by your physician -Make any legal decisions or sign important papers.  Meals: Start with liquid foods such  as gelatin or soup. Progress to regular foods as tolerated. Avoid greasy, spicy, heavy foods. If nausea and/or vomiting occur, drink only clear liquids until the nausea and/or vomiting subsides. Call your physician if vomiting continues.  Special Instructions/Symptoms: Your throat may feel dry or sore from the anesthesia or the breathing  tube placed in your throat during surgery. If this causes discomfort, gargle with warm salt water. The discomfort should disappear within 24 hours.  If you had a scopolamine patch placed behind your ear for the management of post- operative nausea and/or vomiting:  1. The medication in the patch is effective for 72 hours, after which it should be removed.  Wrap patch in a tissue and discard in the trash. Wash hands thoroughly with soap and water. 2. You may remove the patch earlier than 72 hours if you experience unpleasant side effects which may include dry mouth, dizziness or visual disturbances. 3. Avoid touching the patch. Wash your hands with soap and water after contact with the patch.    Regional Anesthesia Blocks  1. Numbness or the inability to move the "blocked" extremity may last from 3-48 hours after placement. The length of time depends on the medication injected and your individual response to the medication. If the numbness is not going away after 48 hours, call your surgeon.  2. The extremity that is blocked will need to be protected until the numbness is gone and the  Strength has returned. Because you cannot feel it, you will need to take extra care to avoid injury. Because it may be weak, you may have difficulty moving it or using it. You may not know what position it is in without looking at it while the block is in effect.  3. For blocks in the legs and feet, returning to weight bearing and walking needs to be done carefully. You will need to wait until the numbness is entirely gone and the strength has returned. You should be able to move your leg and foot normally before you try and bear weight or walk. You will need someone to be with you when you first try to ensure you do not fall and possibly risk injury.  4. Bruising and tenderness at the needle site are common side effects and will resolve in a few days.  5. Persistent numbness or new problems with movement should be  communicated to the surgeon or the [facility] [phone] [place] [phone]).

## 2015-11-15 ENCOUNTER — Other Ambulatory Visit: Payer: Medicare HMO

## 2015-11-17 ENCOUNTER — Encounter (HOSPITAL_BASED_OUTPATIENT_CLINIC_OR_DEPARTMENT_OTHER): Payer: Self-pay | Admitting: Orthopaedic Surgery

## 2015-12-01 DIAGNOSIS — S46222A Laceration of muscle, fascia and tendon of other parts of biceps, left arm, initial encounter: Secondary | ICD-10-CM | POA: Diagnosis not present

## 2015-12-05 DIAGNOSIS — E119 Type 2 diabetes mellitus without complications: Secondary | ICD-10-CM | POA: Diagnosis not present

## 2015-12-05 DIAGNOSIS — M25622 Stiffness of left elbow, not elsewhere classified: Secondary | ICD-10-CM | POA: Diagnosis not present

## 2015-12-05 DIAGNOSIS — M79632 Pain in left forearm: Secondary | ICD-10-CM | POA: Diagnosis not present

## 2015-12-05 DIAGNOSIS — I10 Essential (primary) hypertension: Secondary | ICD-10-CM | POA: Diagnosis not present

## 2015-12-26 DIAGNOSIS — M1712 Unilateral primary osteoarthritis, left knee: Secondary | ICD-10-CM | POA: Diagnosis not present

## 2015-12-29 ENCOUNTER — Other Ambulatory Visit: Payer: Self-pay | Admitting: Orthopaedic Surgery

## 2015-12-29 NOTE — Pre-Procedure Instructions (Signed)
DERIN HEALAN  12/29/2015      [facility] - [place], [place] [address] Phone: [phone] Fax: [place], [place] - [address] [place] [address] Phone: [phone] Fax: [phone]    Your procedure is scheduled on Thursday, August 10th, 2017.  Report to [facility] Admitting at 10:15 A.M.   Call this number if you have problems the morning of surgery:  [phone]   Remember:  Do not eat food or drink liquids after midnight.   Take these medicines the morning of surgery with A SIP OF WATER: Symbicort inhaler if needed (please bring inhaler with you), Gabapentin (Neurontin), Hydrocodone-acetaminophen (Norco) if needed, Methocarbamol (Robaxin) if needed, Ondansetron (Zofran) if needed, Tamsulosin (Flomax), Finasteride (Proscar), Metoprolol Succinate (Toprol-XL).     WHAT DO I DO ABOUT MY DIABETES MEDICATION?  Marland Kitchen Do not take oral diabetes medicines (pills) the morning of surgery.  Do NOT take Metformin the morning of surgery.   7 days prior to surgery, stop taking: Aspirin, NSAIDS, Aleve, Naproxen, Ibuprofen, Advil, Motrin, BC's, Goody's, Fish oil, all herbal medications, and all vitamins.    Do not wear jewelry.  Do not wear lotions, powders, or colognes.    Men may shave face and neck.  Do not bring valuables to the hospital.  [facility] is not responsible for any belongings or valuables.  Contacts, dentures or bridgework may not be worn into surgery.  Leave your suitcase in the car.  After surgery it may be brought to your room.  For patients admitted to the hospital, discharge time will be determined by your treatment team.  Patients discharged the day of surgery will not be allowed to drive home.   Special instructions:  Preparing for Surgery.   Please read over the following fact sheets that you were given. MRSA  Information    How to Manage Your Diabetes Before and After Surgery  Why is it important to control my blood sugar before and after surgery? . Improving blood sugar levels before and after surgery helps healing and can limit problems. . A way of improving blood sugar control is eating a healthy diet by: o  Eating less sugar and carbohydrates o  Increasing activity/exercise o  Talking with your doctor about reaching your blood sugar goals . High blood sugars (greater than 180 mg/dL) can raise your risk of infections and slow your recovery, so you will need to focus on controlling your diabetes during the weeks before surgery. . Make sure that the doctor who takes care of your diabetes knows about your planned surgery including the date and location.  How do I manage my blood sugar before surgery? . Check your blood sugar at least 4 times a day, starting 2 days before surgery, to make sure that the level is not too high or low. o Check your blood sugar the morning of your surgery when you wake up and every 2 hours until you get to the Short Stay unit. . If your blood sugar is less than 70 mg/dL, you will need to treat for low blood sugar: o Do not take insulin. o Treat a low blood sugar (less than 70 mg/dL) with  cup of clear juice (cranberry or apple), 4 glucose tablets, OR glucose gel. o Recheck blood sugar in 15 minutes after treatment (to make sure it  is greater than 70 mg/dL). If your blood sugar is not greater than 70 mg/dL on recheck, call [phone] for further instructions. . Report your blood sugar to the short stay nurse when you get to Short Stay.  . If you are admitted to the hospital after surgery: o Your blood sugar will be checked by the staff and you will probably be given insulin after surgery (instead of oral diabetes medicines) to make sure you have good blood sugar levels. o The goal for blood sugar control after surgery is 80-180 mg/dL.    [place]- Preparing  For Surgery  Before surgery, you can play an important role. Because skin is not sterile, your skin needs to be as free of germs as possible. You can reduce the number of germs on your skin by washing with CHG (chlorahexidine gluconate) Soap before surgery.  CHG is an antiseptic cleaner which kills germs and bonds with the skin to continue killing germs even after washing.  Please do not use if you have an allergy to CHG or antibacterial soaps. If your skin becomes reddened/irritated stop using the CHG.  Do not shave (including legs and underarms) for at least 48 hours prior to first CHG shower. It is OK to shave your face.  Please follow these instructions carefully.   1. Shower the NIGHT BEFORE SURGERY and the MORNING OF SURGERY with CHG.   2. If you chose to wash your hair, wash your hair first as usual with your normal shampoo.  3. After you shampoo, rinse your hair and body thoroughly to remove the shampoo.  4. Use CHG as you would any other liquid soap. You can apply CHG directly to the skin and wash gently with a scrungie or a clean washcloth.   5. Apply the CHG Soap to your body ONLY FROM THE NECK DOWN.  Do not use on open wounds or open sores. Avoid contact with your eyes, ears, mouth and genitals (private parts). Wash genitals (private parts) with your normal soap.  6. Wash thoroughly, paying special attention to the area where your surgery will be performed.  7. Thoroughly rinse your body with warm water from the neck down.  8. DO NOT shower/wash with your normal soap after using and rinsing off the CHG Soap.  9. Pat yourself dry with a CLEAN TOWEL.   10. Wear CLEAN PAJAMAS   11. Place CLEAN SHEETS on your bed the night of your first shower and DO NOT SLEEP WITH PETS.  Day of Surgery: Do not apply any deodorants/lotions. Please wear clean clothes to the hospital/surgery center.

## 2015-12-30 ENCOUNTER — Encounter (HOSPITAL_COMMUNITY): Payer: Self-pay

## 2015-12-30 ENCOUNTER — Encounter (HOSPITAL_COMMUNITY)
Admission: RE | Admit: 2015-12-30 | Discharge: 2015-12-30 | Disposition: A | Discharge status: Home / Self Care | Payer: Medicare HMO | Source: Ambulatory Visit | Attending: Orthopaedic Surgery | Admitting: Orthopaedic Surgery

## 2015-12-30 DIAGNOSIS — Z01812 Encounter for preprocedural laboratory examination: Secondary | ICD-10-CM | POA: Insufficient documentation

## 2015-12-30 DIAGNOSIS — Z7984 Long term (current) use of oral hypoglycemic drugs: Secondary | ICD-10-CM | POA: Diagnosis not present

## 2015-12-30 DIAGNOSIS — E119 Type 2 diabetes mellitus without complications: Secondary | ICD-10-CM | POA: Insufficient documentation

## 2015-12-30 DIAGNOSIS — M1712 Unilateral primary osteoarthritis, left knee: Secondary | ICD-10-CM | POA: Diagnosis not present

## 2015-12-30 DIAGNOSIS — Z0183 Encounter for blood typing: Secondary | ICD-10-CM | POA: Diagnosis not present

## 2015-12-30 DIAGNOSIS — Z7951 Long term (current) use of inhaled steroids: Secondary | ICD-10-CM | POA: Diagnosis not present

## 2015-12-30 DIAGNOSIS — I10 Essential (primary) hypertension: Secondary | ICD-10-CM | POA: Insufficient documentation

## 2015-12-30 DIAGNOSIS — J449 Chronic obstructive pulmonary disease, unspecified: Secondary | ICD-10-CM | POA: Insufficient documentation

## 2015-12-30 DIAGNOSIS — Z79899 Other long term (current) drug therapy: Secondary | ICD-10-CM | POA: Diagnosis not present

## 2015-12-30 HISTORY — DX: Basal cell carcinoma of skin of unspecified parts of face: C44.310

## 2015-12-30 HISTORY — DX: Benign prostatic hyperplasia without lower urinary tract symptoms: N40.0

## 2015-12-30 HISTORY — DX: Unspecified hearing loss, unspecified ear: H91.90

## 2015-12-30 HISTORY — DX: Personal history of urinary calculi: Z87.442

## 2015-12-30 HISTORY — DX: Personal history of pneumonia (recurrent): Z87.01

## 2015-12-30 HISTORY — DX: Nocturia: R35.1

## 2015-12-30 LAB — C-REACTIVE PROTEIN: CRP: 0.8 mg/dL (ref <1.0)

## 2015-12-30 LAB — CBC WITH DIFFERENTIAL/PLATELET
BASOS PCT: 1 %
Basophils Absolute: 0.1 10*3/uL (ref 0.0–0.1)
EOS ABS: 0.4 10*3/uL (ref 0.0–0.7)
Eosinophils Relative: 4 %
HCT: 42.3 % (ref 39.0–52.0)
Hemoglobin: 13.6 g/dL (ref 13.0–17.0)
LYMPHS ABS: 2.6 10*3/uL (ref 0.7–4.0)
Lymphocytes Relative: 26 %
MCH: 28.9 pg (ref 26.0–34.0)
MCHC: 32.2 g/dL (ref 30.0–36.0)
MCV: 89.8 fL (ref 78.0–100.0)
MONO ABS: 0.7 10*3/uL (ref 0.1–1.0)
MONOS PCT: 6 %
Neutro Abs: 6.5 10*3/uL (ref 1.7–7.7)
Neutrophils Relative %: 63 %
Platelets: 210 10*3/uL (ref 150–400)
RBC: 4.71 MIL/uL (ref 4.22–5.81)
RDW: 13.8 % (ref 11.5–15.5)
WBC: 10.3 10*3/uL (ref 4.0–10.5)

## 2015-12-30 LAB — URINALYSIS, ROUTINE W REFLEX MICROSCOPIC
BILIRUBIN URINE: NEGATIVE
GLUCOSE, UA: NEGATIVE mg/dL
HGB URINE DIPSTICK: NEGATIVE
Ketones, ur: NEGATIVE mg/dL
Leukocytes, UA: NEGATIVE
Nitrite: NEGATIVE
Protein, ur: NEGATIVE mg/dL
SPECIFIC GRAVITY, URINE: 1.024 (ref 1.005–1.030)
pH: 6 (ref 5.0–8.0)

## 2015-12-30 LAB — GLUCOSE, CAPILLARY: GLUCOSE-CAPILLARY: 92 mg/dL (ref 65–99)

## 2015-12-30 LAB — COMPREHENSIVE METABOLIC PANEL
ALBUMIN: 4.1 g/dL (ref 3.5–5.0)
ALK PHOS: 61 U/L (ref 38–126)
ALT: 21 U/L (ref 17–63)
ANION GAP: 5 (ref 5–15)
AST: 23 U/L (ref 15–41)
BILIRUBIN TOTAL: 0.3 mg/dL (ref 0.3–1.2)
BUN: 24 mg/dL — ABNORMAL HIGH (ref 6–20)
CALCIUM: 9.6 mg/dL (ref 8.9–10.3)
CO2: 28 mmol/L (ref 22–32)
Chloride: 108 mmol/L (ref 101–111)
Creatinine, Ser: 1.53 mg/dL — ABNORMAL HIGH (ref 0.61–1.24)
GFR calc non Af Amer: 46 mL/min — ABNORMAL LOW (ref >60)
GFR, EST AFRICAN AMERICAN: 53 mL/min — AB (ref >60)
GLUCOSE: 97 mg/dL (ref 65–99)
POTASSIUM: 4.3 mmol/L (ref 3.5–5.1)
SODIUM: 141 mmol/L (ref 135–145)
TOTAL PROTEIN: 6.7 g/dL (ref 6.5–8.1)

## 2015-12-30 LAB — SURGICAL PCR SCREEN
MRSA, PCR: NEGATIVE
STAPHYLOCOCCUS AUREUS: NEGATIVE

## 2015-12-30 LAB — TYPE AND SCREEN
ABO/RH(D): O POS
ANTIBODY SCREEN: NEGATIVE

## 2015-12-30 LAB — PROTIME-INR
INR: 1
Prothrombin Time: 13.2 seconds (ref 11.4–15.2)

## 2015-12-30 LAB — SEDIMENTATION RATE: SED RATE: 16 mm/h (ref 0–16)

## 2015-12-30 LAB — APTT: aPTT: 27 seconds (ref 24–36)

## 2015-12-30 NOTE — Progress Notes (Signed)
PCP - Dr. Cyndi Bender Cardiologist - denies  EKG - 11/13/15 CXR - denies  Echo - 11/13/15 Stress test - denies Cardiac Cath - 04/2004  Patient denies chest pain and shortness of breath at PAT appointment.   Patient states that he checks his blood sugar a couple times a week and that his fasting glucose is 116-123.

## 2015-12-30 NOTE — Progress Notes (Signed)
   12/30/15 1432  OBSTRUCTIVE SLEEP APNEA  Have you ever been diagnosed with sleep apnea through a sleep study? No  Do you snore loudly (loud enough to be heard through closed doors)?  0  Do you often feel tired, fatigued, or sleepy during the daytime (such as falling asleep during driving or talking to someone)? 0  Has anyone observed you stop breathing during your sleep? 1  Do you have, or are you being treated for high blood pressure? 1  BMI more than 35 kg/m2? 0  Age > 50 (1-yes) 1  Neck circumference greater than:Male 16 inches or larger, Male 17inches or larger? 1  Male Gender (Yes=1) 1  Obstructive Sleep Apnea Score 5

## 2015-12-31 LAB — HEMOGLOBIN A1C
HEMOGLOBIN A1C: 6.6 % — AB (ref 4.8–5.6)
Mean Plasma Glucose: 143 mg/dL

## 2016-01-07 MED ORDER — TRANEXAMIC ACID 1000 MG/10ML IV SOLN
1000.0000 mg | INTRAVENOUS | Status: AC
  Administered 2016-01-08: 1000 mg via INTRAVENOUS
  Filled 2016-01-07: qty 10

## 2016-01-07 MED ORDER — CEFAZOLIN SODIUM-DEXTROSE 2-4 GM/100ML-% IV SOLN
2.0000 g | INTRAVENOUS | Status: AC
  Administered 2016-01-08: 2 g via INTRAVENOUS
  Filled 2016-01-07: qty 100

## 2016-01-07 MED ORDER — BUPIVACAINE LIPOSOME 1.3 % IJ SUSP
20.0000 mL | INTRAMUSCULAR | Status: AC
  Administered 2016-01-08: 20 mL
  Filled 2016-01-07: qty 20

## 2016-01-08 ENCOUNTER — Inpatient Hospital Stay (HOSPITAL_COMMUNITY): Payer: Medicare HMO | Admitting: Certified Registered"

## 2016-01-08 ENCOUNTER — Encounter (HOSPITAL_COMMUNITY)
Admission: RE | Disposition: A | Discharge status: Home Health | Payer: Self-pay | Source: Ambulatory Visit | Attending: Orthopaedic Surgery

## 2016-01-08 ENCOUNTER — Encounter (HOSPITAL_COMMUNITY): Payer: Self-pay | Admitting: *Deleted

## 2016-01-08 ENCOUNTER — Inpatient Hospital Stay (HOSPITAL_COMMUNITY)
Admission: RE | Admit: 2016-01-08 | Discharge: 2016-01-10 | DRG: 470 | Disposition: A | Discharge status: Home Health | Payer: Medicare HMO | Source: Ambulatory Visit | Attending: Orthopaedic Surgery | Admitting: Orthopaedic Surgery

## 2016-01-08 ENCOUNTER — Inpatient Hospital Stay (HOSPITAL_COMMUNITY): Payer: Medicare HMO

## 2016-01-08 DIAGNOSIS — Z87891 Personal history of nicotine dependence: Secondary | ICD-10-CM | POA: Diagnosis not present

## 2016-01-08 DIAGNOSIS — H9190 Unspecified hearing loss, unspecified ear: Secondary | ICD-10-CM | POA: Diagnosis not present

## 2016-01-08 DIAGNOSIS — Z471 Aftercare following joint replacement surgery: Secondary | ICD-10-CM | POA: Diagnosis not present

## 2016-01-08 DIAGNOSIS — Z7984 Long term (current) use of oral hypoglycemic drugs: Secondary | ICD-10-CM

## 2016-01-08 DIAGNOSIS — M1712 Unilateral primary osteoarthritis, left knee: Principal | ICD-10-CM | POA: Diagnosis present

## 2016-01-08 DIAGNOSIS — E119 Type 2 diabetes mellitus without complications: Secondary | ICD-10-CM | POA: Diagnosis present

## 2016-01-08 DIAGNOSIS — J449 Chronic obstructive pulmonary disease, unspecified: Secondary | ICD-10-CM | POA: Diagnosis present

## 2016-01-08 DIAGNOSIS — I10 Essential (primary) hypertension: Secondary | ICD-10-CM | POA: Diagnosis not present

## 2016-01-08 DIAGNOSIS — Z85828 Personal history of other malignant neoplasm of skin: Secondary | ICD-10-CM

## 2016-01-08 DIAGNOSIS — Z96659 Presence of unspecified artificial knee joint: Secondary | ICD-10-CM

## 2016-01-08 DIAGNOSIS — Z96652 Presence of left artificial knee joint: Secondary | ICD-10-CM

## 2016-01-08 DIAGNOSIS — N400 Enlarged prostate without lower urinary tract symptoms: Secondary | ICD-10-CM | POA: Diagnosis not present

## 2016-01-08 DIAGNOSIS — Z96642 Presence of left artificial hip joint: Secondary | ICD-10-CM | POA: Diagnosis not present

## 2016-01-08 DIAGNOSIS — M25562 Pain in left knee: Secondary | ICD-10-CM | POA: Diagnosis present

## 2016-01-08 DIAGNOSIS — M179 Osteoarthritis of knee, unspecified: Secondary | ICD-10-CM | POA: Diagnosis not present

## 2016-01-08 DIAGNOSIS — Z79899 Other long term (current) drug therapy: Secondary | ICD-10-CM

## 2016-01-08 HISTORY — PX: TOTAL KNEE ARTHROPLASTY: SHX125

## 2016-01-08 LAB — GLUCOSE, CAPILLARY
GLUCOSE-CAPILLARY: 119 mg/dL — AB (ref 65–99)
Glucose-Capillary: 133 mg/dL — ABNORMAL HIGH (ref 65–99)
Glucose-Capillary: 126 mg/dL — ABNORMAL HIGH (ref 65–99)

## 2016-01-08 SURGERY — ARTHROPLASTY, KNEE, TOTAL
Anesthesia: Spinal | Site: Knee | Laterality: Left

## 2016-01-08 MED ORDER — HYDROMORPHONE HCL 1 MG/ML IJ SOLN: 0.2500 mg | INTRAMUSCULAR | Status: DC | PRN

## 2016-01-08 MED ORDER — SODIUM CHLORIDE 0.9 % IJ SOLN
INTRAMUSCULAR | Status: DC | PRN
Start: 2016-01-08 — End: 2016-01-08
  Administered 2016-01-08: 40 mL

## 2016-01-08 MED ORDER — OXYCODONE HCL 5 MG PO TABS
ORAL_TABLET | ORAL | Status: AC
  Filled 2016-01-08: qty 2

## 2016-01-08 MED ORDER — MORPHINE SULFATE (PF) 2 MG/ML IV SOLN
1.0000 mg | INTRAVENOUS | Status: DC | PRN
  Administered 2016-01-08 (×2): 1 mg via INTRAVENOUS
  Filled 2016-01-08 (×3): qty 1

## 2016-01-08 MED ORDER — CHLORHEXIDINE GLUCONATE 4 % EX LIQD: 60.0000 mL | Freq: Once | CUTANEOUS | Status: DC

## 2016-01-08 MED ORDER — METOCLOPRAMIDE HCL 5 MG PO TABS: 5.0000 mg | ORAL_TABLET | Freq: Three times a day (TID) | ORAL | Status: DC | PRN

## 2016-01-08 MED ORDER — DIPHENHYDRAMINE HCL 12.5 MG/5ML PO ELIX: 25.0000 mg | ORAL_SOLUTION | ORAL | Status: DC | PRN

## 2016-01-08 MED ORDER — LISINOPRIL-HYDROCHLOROTHIAZIDE 20-25 MG PO TABS: 1.0000 | ORAL_TABLET | Freq: Every day | ORAL | Status: DC

## 2016-01-08 MED ORDER — GABAPENTIN 300 MG PO CAPS
300.0000 mg | ORAL_CAPSULE | Freq: Three times a day (TID) | ORAL | Status: DC
  Administered 2016-01-08 – 2016-01-10 (×6): 300 mg via ORAL
  Filled 2016-01-08 (×6): qty 1

## 2016-01-08 MED ORDER — TRANEXAMIC ACID 1000 MG/10ML IV SOLN
1000.0000 mg | Freq: Once | INTRAVENOUS | Status: AC
  Administered 2016-01-08: 1000 mg via INTRAVENOUS
  Filled 2016-01-08: qty 10

## 2016-01-08 MED ORDER — OXYCODONE HCL 5 MG PO TABS: 5.0000 mg | ORAL_TABLET | Freq: Once | ORAL | Status: DC | PRN

## 2016-01-08 MED ORDER — CEFAZOLIN SODIUM-DEXTROSE 2-4 GM/100ML-% IV SOLN
2.0000 g | Freq: Four times a day (QID) | INTRAVENOUS | Status: AC
  Administered 2016-01-08 – 2016-01-09 (×2): 2 g via INTRAVENOUS
  Filled 2016-01-08 (×2): qty 100

## 2016-01-08 MED ORDER — OXYCODONE HCL 5 MG PO TABS
5.0000 mg | ORAL_TABLET | ORAL | Status: DC | PRN
  Administered 2016-01-08: 15 mg via ORAL
  Administered 2016-01-08 – 2016-01-10 (×7): 10 mg via ORAL
  Filled 2016-01-08 (×6): qty 2
  Filled 2016-01-08: qty 3

## 2016-01-08 MED ORDER — EPHEDRINE SULFATE 50 MG/ML IJ SOLN
INTRAMUSCULAR | Status: DC | PRN
  Administered 2016-01-08 (×3): 10 mg via INTRAVENOUS

## 2016-01-08 MED ORDER — OXYCODONE HCL 5 MG/5ML PO SOLN: 5.0000 mg | Freq: Once | ORAL | Status: DC | PRN

## 2016-01-08 MED ORDER — MENTHOL 3 MG MT LOZG: 1.0000 | LOZENGE | OROMUCOSAL | Status: DC | PRN

## 2016-01-08 MED ORDER — KETOROLAC TROMETHAMINE 30 MG/ML IJ SOLN
INTRAMUSCULAR | Status: AC
  Filled 2016-01-08: qty 1

## 2016-01-08 MED ORDER — PROPOFOL 1000 MG/100ML IV EMUL
INTRAVENOUS | Status: AC
  Filled 2016-01-08: qty 100

## 2016-01-08 MED ORDER — MIDAZOLAM HCL 2 MG/2ML IJ SOLN
INTRAMUSCULAR | Status: AC
  Filled 2016-01-08: qty 2

## 2016-01-08 MED ORDER — MIDAZOLAM HCL 5 MG/5ML IJ SOLN
INTRAMUSCULAR | Status: DC | PRN
  Administered 2016-01-08: 2 mg via INTRAVENOUS

## 2016-01-08 MED ORDER — ASPIRIN EC 325 MG PO TBEC
325.0000 mg | DELAYED_RELEASE_TABLET | Freq: Two times a day (BID) | ORAL | Status: DC
  Administered 2016-01-08 – 2016-01-10 (×4): 325 mg via ORAL
  Filled 2016-01-08 (×4): qty 1

## 2016-01-08 MED ORDER — KETOROLAC TROMETHAMINE 30 MG/ML IJ SOLN
30.0000 mg | Freq: Four times a day (QID) | INTRAMUSCULAR | Status: AC | PRN
  Administered 2016-01-08 – 2016-01-09 (×2): 30 mg via INTRAVENOUS
  Filled 2016-01-08: qty 1

## 2016-01-08 MED ORDER — ALUM & MAG HYDROXIDE-SIMETH 200-200-20 MG/5ML PO SUSP
30.0000 mL | ORAL | Status: DC | PRN
  Administered 2016-01-09: 30 mL via ORAL
  Filled 2016-01-08: qty 30

## 2016-01-08 MED ORDER — TRANEXAMIC ACID 1000 MG/10ML IV SOLN
2000.0000 mg | INTRAVENOUS | Status: AC
  Administered 2016-01-08: 2000 mg via TOPICAL
  Filled 2016-01-08: qty 20

## 2016-01-08 MED ORDER — LISINOPRIL 20 MG PO TABS
20.0000 mg | ORAL_TABLET | Freq: Every day | ORAL | Status: DC
  Administered 2016-01-09 – 2016-01-10 (×2): 20 mg via ORAL
  Filled 2016-01-08 (×3): qty 1

## 2016-01-08 MED ORDER — DEXAMETHASONE SODIUM PHOSPHATE 10 MG/ML IJ SOLN
10.0000 mg | Freq: Once | INTRAMUSCULAR | Status: AC
  Administered 2016-01-09: 10 mg via INTRAVENOUS
  Filled 2016-01-08: qty 1

## 2016-01-08 MED ORDER — HYDROCHLOROTHIAZIDE 25 MG PO TABS
25.0000 mg | ORAL_TABLET | Freq: Every day | ORAL | Status: DC
  Administered 2016-01-09 – 2016-01-10 (×2): 25 mg via ORAL
  Filled 2016-01-08 (×3): qty 1

## 2016-01-08 MED ORDER — FENTANYL CITRATE (PF) 100 MCG/2ML IJ SOLN
INTRAMUSCULAR | Status: DC | PRN
  Administered 2016-01-08 (×2): 50 ug via INTRAVENOUS

## 2016-01-08 MED ORDER — METHOCARBAMOL 1000 MG/10ML IJ SOLN
500.0000 mg | Freq: Four times a day (QID) | INTRAVENOUS | Status: DC | PRN
  Filled 2016-01-08: qty 5

## 2016-01-08 MED ORDER — PROPOFOL 10 MG/ML IV BOLUS
INTRAVENOUS | Status: DC | PRN
  Administered 2016-01-08: 40 mg via INTRAVENOUS

## 2016-01-08 MED ORDER — PHENOL 1.4 % MT LIQD: 1.0000 | OROMUCOSAL | Status: DC | PRN

## 2016-01-08 MED ORDER — METHOCARBAMOL 750 MG PO TABS: 750.0000 mg | ORAL_TABLET | Freq: Two times a day (BID) | ORAL | 0 refills | Status: DC | PRN

## 2016-01-08 MED ORDER — ASPIRIN EC 325 MG PO TBEC: 325.0000 mg | DELAYED_RELEASE_TABLET | Freq: Two times a day (BID) | ORAL | 0 refills | Status: DC

## 2016-01-08 MED ORDER — LACTATED RINGERS IV SOLN
INTRAVENOUS | Status: DC
  Administered 2016-01-08 (×2): via INTRAVENOUS

## 2016-01-08 MED ORDER — POLYETHYLENE GLYCOL 3350 17 G PO PACK
17.0000 g | PACK | Freq: Every day | ORAL | Status: DC | PRN
Start: 2016-01-08 — End: 2016-01-10

## 2016-01-08 MED ORDER — EPHEDRINE 5 MG/ML INJ
INTRAVENOUS | Status: AC
  Filled 2016-01-08: qty 10

## 2016-01-08 MED ORDER — ACETAMINOPHEN 650 MG RE SUPP: 650.0000 mg | Freq: Four times a day (QID) | RECTAL | Status: DC | PRN

## 2016-01-08 MED ORDER — PROPOFOL 500 MG/50ML IV EMUL
INTRAVENOUS | Status: DC | PRN
  Administered 2016-01-08: 75 ug/kg/min via INTRAVENOUS

## 2016-01-08 MED ORDER — OXYCODONE HCL 5 MG PO TABS: 5.0000 mg | ORAL_TABLET | ORAL | 0 refills | Status: DC | PRN

## 2016-01-08 MED ORDER — FENTANYL CITRATE (PF) 100 MCG/2ML IJ SOLN
INTRAMUSCULAR | Status: DC
Start: 2016-01-08 — End: 2016-01-08
  Filled 2016-01-08: qty 2

## 2016-01-08 MED ORDER — TAMSULOSIN HCL 0.4 MG PO CAPS
0.4000 mg | ORAL_CAPSULE | Freq: Every morning | ORAL | Status: DC
  Administered 2016-01-09 – 2016-01-10 (×2): 0.4 mg via ORAL
  Filled 2016-01-08 (×2): qty 1

## 2016-01-08 MED ORDER — METOPROLOL SUCCINATE ER 25 MG PO TB24
25.0000 mg | ORAL_TABLET | Freq: Every day | ORAL | Status: DC
  Administered 2016-01-08 – 2016-01-10 (×3): 25 mg via ORAL
  Filled 2016-01-08 (×3): qty 1

## 2016-01-08 MED ORDER — ACETAMINOPHEN 160 MG/5ML PO SOLN
325.0000 mg | ORAL | Status: DC | PRN
  Filled 2016-01-08: qty 20.3

## 2016-01-08 MED ORDER — TRAZODONE HCL 100 MG PO TABS
100.0000 mg | ORAL_TABLET | Freq: Every day | ORAL | Status: DC
  Administered 2016-01-08 – 2016-01-09 (×2): 100 mg via ORAL
  Filled 2016-01-08 (×2): qty 1

## 2016-01-08 MED ORDER — BUPIVACAINE IN DEXTROSE 0.75-8.25 % IT SOLN
INTRATHECAL | Status: DC | PRN
  Administered 2016-01-08: 2 mL via INTRATHECAL

## 2016-01-08 MED ORDER — 0.9 % SODIUM CHLORIDE (POUR BTL) OPTIME
TOPICAL | Status: DC | PRN
  Administered 2016-01-08: 1000 mL

## 2016-01-08 MED ORDER — ACETAMINOPHEN 325 MG PO TABS: 650.0000 mg | ORAL_TABLET | Freq: Four times a day (QID) | ORAL | Status: DC | PRN

## 2016-01-08 MED ORDER — ADULT MULTIVITAMIN W/MINERALS CH
1.0000 | ORAL_TABLET | Freq: Every day | ORAL | Status: DC
  Administered 2016-01-08 – 2016-01-10 (×3): 1 via ORAL
  Filled 2016-01-08 (×3): qty 1

## 2016-01-08 MED ORDER — SENNOSIDES-DOCUSATE SODIUM 8.6-50 MG PO TABS: 1.0000 | ORAL_TABLET | Freq: Every evening | ORAL | 1 refills | Status: DC | PRN

## 2016-01-08 MED ORDER — ONDANSETRON HCL 4 MG PO TABS: 4.0000 mg | ORAL_TABLET | Freq: Four times a day (QID) | ORAL | Status: DC | PRN

## 2016-01-08 MED ORDER — MOMETASONE FURO-FORMOTEROL FUM 200-5 MCG/ACT IN AERO
2.0000 | INHALATION_SPRAY | Freq: Two times a day (BID) | RESPIRATORY_TRACT | Status: DC
  Administered 2016-01-08: 2 via RESPIRATORY_TRACT
  Filled 2016-01-08: qty 8.8

## 2016-01-08 MED ORDER — OXYCODONE HCL ER 10 MG PO T12A: 10.0000 mg | EXTENDED_RELEASE_TABLET | Freq: Two times a day (BID) | ORAL | 0 refills | Status: DC

## 2016-01-08 MED ORDER — CELECOXIB 200 MG PO CAPS
200.0000 mg | ORAL_CAPSULE | Freq: Two times a day (BID) | ORAL | Status: DC
  Administered 2016-01-08 – 2016-01-10 (×4): 200 mg via ORAL
  Filled 2016-01-08 (×4): qty 1

## 2016-01-08 MED ORDER — ACETAMINOPHEN 325 MG PO TABS: 325.0000 mg | ORAL_TABLET | ORAL | Status: DC | PRN

## 2016-01-08 MED ORDER — METOCLOPRAMIDE HCL 5 MG/ML IJ SOLN
5.0000 mg | Freq: Three times a day (TID) | INTRAMUSCULAR | Status: DC | PRN
  Administered 2016-01-08: 10 mg via INTRAVENOUS
  Filled 2016-01-08: qty 2

## 2016-01-08 MED ORDER — SODIUM CHLORIDE 0.9 % IR SOLN
Status: DC | PRN
  Administered 2016-01-08: 3000 mL

## 2016-01-08 MED ORDER — MAGNESIUM CITRATE PO SOLN: 1.0000 | Freq: Once | ORAL | Status: DC | PRN

## 2016-01-08 MED ORDER — OXYCODONE HCL ER 10 MG PO T12A
10.0000 mg | EXTENDED_RELEASE_TABLET | Freq: Two times a day (BID) | ORAL | Status: DC
  Administered 2016-01-08 – 2016-01-10 (×4): 10 mg via ORAL
  Filled 2016-01-08 (×5): qty 1

## 2016-01-08 MED ORDER — FINASTERIDE 5 MG PO TABS
5.0000 mg | ORAL_TABLET | Freq: Every day | ORAL | Status: DC
  Administered 2016-01-08 – 2016-01-10 (×3): 5 mg via ORAL
  Filled 2016-01-08 (×3): qty 1

## 2016-01-08 MED ORDER — SORBITOL 70 % SOLN: 30.0000 mL | Freq: Every day | Status: DC | PRN

## 2016-01-08 MED ORDER — FENTANYL CITRATE (PF) 250 MCG/5ML IJ SOLN
INTRAMUSCULAR | Status: AC
  Filled 2016-01-08: qty 5

## 2016-01-08 MED ORDER — METHOCARBAMOL 500 MG PO TABS
ORAL_TABLET | ORAL | Status: AC
  Filled 2016-01-08: qty 1

## 2016-01-08 MED ORDER — POVIDONE-IODINE 10 % EX SOLN
CUTANEOUS | Status: DC | PRN
  Administered 2016-01-08: 1 via TOPICAL

## 2016-01-08 MED ORDER — METHOCARBAMOL 500 MG PO TABS
500.0000 mg | ORAL_TABLET | Freq: Four times a day (QID) | ORAL | Status: DC | PRN
  Administered 2016-01-08 – 2016-01-09 (×4): 500 mg via ORAL
  Filled 2016-01-08 (×3): qty 1

## 2016-01-08 MED ORDER — ACETAMINOPHEN 500 MG PO TABS
1000.0000 mg | ORAL_TABLET | Freq: Four times a day (QID) | ORAL | Status: AC
  Administered 2016-01-08 – 2016-01-09 (×4): 1000 mg via ORAL
  Filled 2016-01-08 (×4): qty 2

## 2016-01-08 MED ORDER — SODIUM CHLORIDE 0.9 % IV SOLN
INTRAVENOUS | Status: DC
  Administered 2016-01-08: 16:00:00 via INTRAVENOUS

## 2016-01-08 MED ORDER — PROPOFOL 10 MG/ML IV BOLUS
INTRAVENOUS | Status: AC
  Filled 2016-01-08: qty 20

## 2016-01-08 MED ORDER — ONDANSETRON HCL 4 MG/2ML IJ SOLN
4.0000 mg | Freq: Four times a day (QID) | INTRAMUSCULAR | Status: DC | PRN
  Administered 2016-01-08: 4 mg via INTRAVENOUS
  Filled 2016-01-08 (×2): qty 2

## 2016-01-08 MED ORDER — ONDANSETRON HCL 4 MG PO TABS: 4.0000 mg | ORAL_TABLET | Freq: Three times a day (TID) | ORAL | 0 refills | Status: DC | PRN

## 2016-01-08 SURGICAL SUPPLY — 65 items
ALCOHOL ISOPROPYL (RUBBING) (MISCELLANEOUS) ×2 IMPLANT
BAG DECANTER FOR FLEXI CONT (MISCELLANEOUS) ×2 IMPLANT
BANDAGE ACE 6X5 VEL STRL LF (GAUZE/BANDAGES/DRESSINGS) ×2 IMPLANT
BANDAGE ELASTIC 6 VELCRO ST LF (GAUZE/BANDAGES/DRESSINGS) ×4 IMPLANT
BANDAGE ESMARK 6X9 LF (GAUZE/BANDAGES/DRESSINGS) ×1 IMPLANT
BENZOIN TINCTURE PRP APPL 2/3 (GAUZE/BANDAGES/DRESSINGS) ×2 IMPLANT
BLADE SAW SGTL 13.0X1.19X90.0M (BLADE) ×2 IMPLANT
BNDG ESMARK 6X9 LF (GAUZE/BANDAGES/DRESSINGS) ×2
BONE CEMENT PALACOS R-G (Orthopedic Implant) ×4 IMPLANT
BOWL SMART MIX CTS (DISPOSABLE) ×2 IMPLANT
CAP KNEE TOTAL 3 ×2 IMPLANT
CEMENT BONE PALACOS R-G (Orthopedic Implant) ×2 IMPLANT
CLSR STERI-STRIP ANTIMIC 1/2X4 (GAUZE/BANDAGES/DRESSINGS) ×4 IMPLANT
COVER SURGICAL LIGHT HANDLE (MISCELLANEOUS) ×2 IMPLANT
CUFF TOURNIQUET SINGLE 34IN LL (TOURNIQUET CUFF) ×2 IMPLANT
CUFF TOURNIQUET SINGLE 44IN (TOURNIQUET CUFF) IMPLANT
DRAPE EXTREMITY T 121X128X90 (DRAPE) ×2 IMPLANT
DRAPE INCISE IOBAN 66X45 STRL (DRAPES) ×2 IMPLANT
DRAPE ORTHO SPLIT 77X108 STRL (DRAPES) ×2
DRAPE PROXIMA HALF (DRAPES) ×2 IMPLANT
DRAPE SURG 17X11 SM STRL (DRAPES) ×4 IMPLANT
DRAPE SURG ORHT 6 SPLT 77X108 (DRAPES) ×2 IMPLANT
DRSG AQUACEL AG ADV 3.5X14 (GAUZE/BANDAGES/DRESSINGS) ×2 IMPLANT
DURAPREP 26ML APPLICATOR (WOUND CARE) ×6 IMPLANT
ELECT CAUTERY BLADE 6.4 (BLADE) ×2 IMPLANT
ELECT REM PT RETURN 9FT ADLT (ELECTROSURGICAL) ×2
ELECTRODE REM PT RTRN 9FT ADLT (ELECTROSURGICAL) ×1 IMPLANT
GLOVE SURG SYN 7.5  E (GLOVE) ×3
GLOVE SURG SYN 7.5 E (GLOVE) ×3 IMPLANT
GOWN STRL REIN XL XLG (GOWN DISPOSABLE) ×4 IMPLANT
GOWN STRL REUS W/ TWL LRG LVL3 (GOWN DISPOSABLE) ×1 IMPLANT
GOWN STRL REUS W/TWL LRG LVL3 (GOWN DISPOSABLE) ×1
HANDPIECE INTERPULSE COAX TIP (DISPOSABLE) ×1
HOOD PEEL AWAY FLYTE STAYCOOL (MISCELLANEOUS) ×6 IMPLANT
KIT BASIN OR (CUSTOM PROCEDURE TRAY) ×2 IMPLANT
KIT ROOM TURNOVER OR (KITS) ×2 IMPLANT
MANIFOLD NEPTUNE II (INSTRUMENTS) ×2 IMPLANT
NEEDLE 18GX1X1/2 (RX/OR ONLY) (NEEDLE) ×2 IMPLANT
NEEDLE SPNL 18GX3.5 QUINCKE PK (NEEDLE) ×2 IMPLANT
NS IRRIG 1000ML POUR BTL (IV SOLUTION) ×2 IMPLANT
PACK TOTAL JOINT (CUSTOM PROCEDURE TRAY) ×2 IMPLANT
PAD ARMBOARD 7.5X6 YLW CONV (MISCELLANEOUS) ×4 IMPLANT
PEN SKIN MARKING BROAD (MISCELLANEOUS) ×2 IMPLANT
SAW OSC TIP CART 19.5X105X1.3 (SAW) ×2 IMPLANT
SEALER BIPOLAR AQUA 6.0 (INSTRUMENTS) ×2 IMPLANT
SET HNDPC FAN SPRY TIP SCT (DISPOSABLE) ×1 IMPLANT
STAPLER VISISTAT 35W (STAPLE) IMPLANT
STRIP CLOSURE SKIN 1/2X4 (GAUZE/BANDAGES/DRESSINGS) ×2 IMPLANT
SUCTION FRAZIER HANDLE 10FR (MISCELLANEOUS) ×1
SUCTION TUBE FRAZIER 10FR DISP (MISCELLANEOUS) ×1 IMPLANT
SUT ETHILON 2 0 FS 18 (SUTURE) IMPLANT
SUT MNCRL AB 3-0 PS2 18 (SUTURE) ×2 IMPLANT
SUT MNCRL AB 4-0 PS2 18 (SUTURE) ×2 IMPLANT
SUT VIC AB 0 CT1 27 (SUTURE) ×2
SUT VIC AB 0 CT1 27XBRD ANBCTR (SUTURE) ×2 IMPLANT
SUT VIC AB 1 CTX 27 (SUTURE) ×8 IMPLANT
SUT VIC AB 2-0 CT1 27 (SUTURE) ×2
SUT VIC AB 2-0 CT1 TAPERPNT 27 (SUTURE) ×2 IMPLANT
SUT VIC AB 2-0 CTB1 (SUTURE) ×2 IMPLANT
SYR 20CC LL (SYRINGE) ×2 IMPLANT
SYR 50ML LL SCALE MARK (SYRINGE) ×2 IMPLANT
SYR CONTROL 10ML LL (SYRINGE) ×2 IMPLANT
TOWEL OR 17X24 6PK STRL BLUE (TOWEL DISPOSABLE) ×6 IMPLANT
TOWEL OR 17X26 10 PK STRL BLUE (TOWEL DISPOSABLE) ×2 IMPLANT
WRAP KNEE MAXI GEL POST OP (GAUZE/BANDAGES/DRESSINGS) ×2 IMPLANT

## 2016-01-08 NOTE — Progress Notes (Signed)
Pt arrives to room 5n19 at this time.  C/o pain but states, "was just medicated for pain"  No other s/s of any acute distress noted.  Report obtained from [place].

## 2016-01-08 NOTE — Anesthesia Procedure Notes (Signed)
Spinal Patient location during procedure: OR Staffing Anesthesiologist: Mackenzi Krogh Preanesthetic Checklist Completed: patient identified, surgical consent, pre-op evaluation, timeout performed, IV checked, risks and benefits discussed and monitors and equipment checked Spinal Block Patient position: sitting Prep: site prepped and draped and DuraPrep Patient monitoring: heart rate, cardiac monitor, continuous pulse ox and blood pressure Approach: midline Location: L3-4 Injection technique: single-shot Needle Needle type: Pencan  Needle gauge: 24 G Needle length: 10 cm Assessment Sensory level: T6   

## 2016-01-08 NOTE — Op Note (Signed)
Total Knee Arthroplasty Procedure Note Chris Washington [id number] 01/08/2016   Preoperative diagnosis: Left knee osteoarthritis  Postoperative diagnosis:same  Operative procedure: Left total knee arthroplasty. CPT [phone]  Surgeon: N. Eduard Roux, MD  Assistants: Laure Kidney, RNFA  Anesthesia: Spinal, regional  Tourniquet time: less than 90 mins  Implants used: [employer] II BCS Femur: 6 Tibia: 6 Patella: 32 mm, 7.5 thick Polyethylene: 10 mm  Indication: Chris Washington is a 65 y.o. year old male with a history of knee pain. Having failed conservative management, the patient elected to proceed with a total knee arthroplasty.  We have reviewed the risk and benefits of the surgery and they elected to proceed after voicing understanding.  Procedure:  After informed consent was obtained and understanding of the risk were voiced including but not limited to bleeding, infection, damage to surrounding structures including nerves and vessels, blood clots, leg length inequality and the failure to achieve desired results, the operative extremity was marked with verbal confirmation of the patient in the holding area.   The patient was then brought to the operating room and transported to the operating room table in the supine position.  A tourniquet was applied to the operative extremity around the upper thigh. The operative limb was then prepped and draped in the usual sterile fashion and preoperative antibiotics were administered.  A time out was performed prior to the start of surgery confirming the correct extremity, preoperative antibiotic administration, as well as team members, implants and instruments available for the case. Correct surgical site was also confirmed with preoperative radiographs. The limb was then elevated for exsanguination and the tourniquet was inflated. A midline incision was made and a standard medial parapatellar approach was performed.  The patella was  prepared and sized to a 32 mm.  A cover was placed on the patella for protection from retractors.  We then turned our attention to the femur. Posterior cruciate ligament was sacrificed. Start site was drilled in the femur and the intramedullary distal femoral cutting guide was placed, set at 5 degrees valgus, taking 9 mm of distal resection. The distal cut was made. Osteophytes were then removed. Next, the proximal tibial cutting guide was placed with appropriate slope, varus/valgus alignment and depth of resection. The proximal tibial cut was made. Gap blocks were then used to assess the extension gap and alignment, and appropriate soft tissue releases were performed. Attention was turned back to the femur, which was sized using the sizing guide to a size 6. Appropriate rotation of the femoral component was determined using epicondylar axis, Whiteside's line, and assessing the flexion gap under ligament tension. The appropriate size 4-in-1 cutting block was placed and cuts were made. Posterior femoral osteophytes and uncapped bone were then removed with the curved osteotome. The tibia was sized for a size 6 component. The femoral box-cutting guide was placed and prepared for a PS femoral component. Trial components were placed, and stability was checked in full extension, mid-flexion, and deep flexion. Proper tibial rotation was determined and marked.  The patella tracked well without a lateral release. Trial components were then removed and tibial preparation performed. A posterior capsular injection comprising of 20 cc of 1.3% exparel and 40 cc of normal saline was performed for postoperative pain control. The bony surfaces were irrigated with a pulse lavage and then dried. Bone cement was vacuum mixed on the back table, and the final components sized above were cemented into place. After cement had finished curing,  excess cement was removed. The stability of the construct was re-evaluated throughout a range  of motion and found to be acceptable. The trial liner was removed, the knee was copiously irrigated, and the knee was re-evaluated for any excess bone debris. The real polyethylene liner, 10 mm thick, was inserted and checked to ensure the locking mechanism had engaged appropriately. The tourniquet was deflated and hemostasis was achieved. The wound was irrigated with dilute betadine in normal saline, and then again with normal saline. A drain was not placed. Capsular closure was performed with a #1 vicryl, subcutaneous fat closed with a 2.0 vicryl suture, then subcutaneous tissue closed with interrupted 2.0 vicryl suture. The skin was then closed with a 3.0 monocryl. A sterile dressing was applied.   The patient was awakened in the operating room and taken to recovery in stable condition. All sponge, needle, and instrument counts were correct at the end of the case.  Position: supine  Complications: none.  Time Out: performed   Drains/Packing: none  Estimated blood loss: 75 cc  Returned to Recovery Room: in good condition.   Antibiotics: yes   Mechanical VTE (DVT) Prophylaxis: sequential compression devices, TED thigh-high  Chemical VTE (DVT) Prophylaxis: aspirin  Fluid Replacement  Crystalloid: see anesthesia record Blood: none  FFP: none   Specimens Removed: 1 to pathology   Sponge and Instrument Count Correct? yes   PACU: portable radiograph - knee AP and Lateral   Admission: inpatient status, start PT & OT POD#1  Plan/RTC: Return in 2 weeks for wound check.   Weight Bearing/Load Lower Extremity: full   N. Eduard Roux, MD [place] 2:34 PM

## 2016-01-08 NOTE — Anesthesia Procedure Notes (Signed)
Procedure Name: MAC Date/Time: 01/08/2016 12:30 PM Performed by: Lance Coon Pre-anesthesia Checklist: Patient identified, Emergency Drugs available, Suction available, Patient being monitored and Timeout performed Patient Re-evaluated:Patient Re-evaluated prior to inductionOxygen Delivery Method: Simple face mask Intubation Type: IV induction Ventilation: Nasal airway inserted- appropriate to patient size

## 2016-01-08 NOTE — Discharge Instructions (Signed)

## 2016-01-08 NOTE — Transfer of Care (Signed)
Immediate Anesthesia Transfer of Care Note  Patient: Chris Washington  Procedure(s) Performed: Procedure(s): LEFT TOTAL KNEE ARTHROPLASTY (Left)  Patient Location: PACU  Anesthesia Type:Spinal  Level of Consciousness: awake, alert  and oriented  Airway & Oxygen Therapy: Patient Spontanous Breathing  Post-op Assessment: Report given to RN and Post -op Vital signs reviewed and stable  Post vital signs: Reviewed and stable  Last Vitals:  Vitals:   01/08/16 1015  BP: 140/73  Pulse: (!) 57  Resp: 20  Temp: 36.7 C    Last Pain:  Vitals:   01/08/16 1015  TempSrc: Oral  PainSc:       Patients Stated Pain Goal: 3 ([id number])  Complications: No apparent anesthesia complications spinal Level assessed L2, pain controlled.

## 2016-01-08 NOTE — H&P (Signed)
PREOPERATIVE H&P  Chief Complaint: left knee degenerative joint disease  HPI: Chris Washington is a 65 y.o. male who presents for surgical treatment of left knee degenerative joint disease.  He denies any changes in medical history.  Past Medical History:  Diagnosis Date  . Arthritis    takes gabapentin for chronic knee pain  . Basal cell carcinoma of skin of face    left  . COPD (chronic obstructive pulmonary disease) ([place])   . Diabetes mellitus without complication ([place])    Type II  . Enlarged prostate   . Hard of hearing   . History of kidney stones   . History of pneumonia   . Hypertension   . Nocturia    "controlled with medicines"   Past Surgical History:  Procedure Laterality Date  . BACK SURGERY  2014   lumbar laminectomy  . BASAL CELL CARCINOMA EXCISION     left side of face  . CARDIAC CATHETERIZATION  05/14/04   normal coronaries, LV systolic function, renals, abdominal aorta (Dr. Myrtice Lauth, [facility])  . COLONOSCOPY    . DISTAL BICEPS TENDON REPAIR Left 11/14/2015   Procedure: LEFT DISTAL BICEPS TENDON REPAIR;  Surgeon: Leandrew Koyanagi, MD;  Location: [facility];  Service: Orthopedics;  Laterality: Left;   Social History   Social History  . Marital status: Married    Spouse name: N/A  . Number of children: N/A  . Years of education: N/A   Social History Main Topics  . Smoking status: Former Smoker    Quit date: 05/31/2004  . Smokeless tobacco: Never Used  . Alcohol use No  . Drug use: No  . Sexual activity: Not Asked   Other Topics Concern  . None   Social History Narrative  . None   History reviewed. No pertinent family history. Allergies  Allergen Reactions  . No Known Allergies Other (See Comments)   Prior to Admission medications   Medication Sig Start Date End Date Taking? Authorizing Provider  budesonide-formoterol (SYMBICORT) 160-4.5 MCG/ACT inhaler Inhale 2 puffs into the lungs 2 (two) times daily as needed.   Yes  Historical Provider, MD  finasteride (PROSCAR) 5 MG tablet Take 5 mg by mouth daily. 11/27/15  Yes Historical Provider, MD  gabapentin (NEURONTIN) 300 MG capsule Take 300 mg by mouth 3 (three) times daily.   Yes Historical Provider, MD  HYDROcodone-acetaminophen (NORCO/VICODIN) 5-325 MG tablet Take 1 tablet by mouth every 6 (six) hours as needed for moderate pain.   Yes Historical Provider, MD  ibuprofen (ADVIL,MOTRIN) 800 MG tablet Take 800 mg by mouth every 8 (eight) hours as needed.   Yes Historical Provider, MD  lisinopril-hydrochlorothiazide (PRINZIDE,ZESTORETIC) 20-25 MG tablet Take 1 tablet by mouth daily. 11/27/15  Yes Historical Provider, MD  metFORMIN (GLUCOPHAGE-XR) 500 MG 24 hr tablet Take 1,000 mg by mouth every evening.   Yes Historical Provider, MD  metoprolol succinate (TOPROL-XL) 25 MG 24 hr tablet Take 25 mg by mouth daily. 11/28/15  Yes Historical Provider, MD  Multiple Vitamin (MULTIVITAMIN WITH MINERALS) TABS tablet Take 1 tablet by mouth daily.   Yes Historical Provider, MD  tamsulosin (FLOMAX) 0.4 MG CAPS Take 1 capsule (0.4 mg total) by mouth every morning. 12/29/12  Yes Newman Pies, MD  traZODone (DESYREL) 100 MG tablet Take 100 mg by mouth at bedtime. 12/10/15  Yes Historical Provider, MD     Positive ROS: All other systems have been reviewed and were otherwise negative with the exception  of those mentioned in the HPI and as above.  Physical Exam: General: Alert, no acute distress Cardiovascular: No pedal edema Respiratory: No cyanosis, no use of accessory musculature GI: abdomen soft Skin: No lesions in the area of chief complaint Neurologic: Sensation intact distally Psychiatric: Patient is competent for consent with normal mood and affect Lymphatic: no lymphedema  MUSCULOSKELETAL: exam stable  Assessment: left knee degenerative joint disease  Plan: Plan for Procedure(s): LEFT TOTAL KNEE ARTHROPLASTY  The risks benefits and alternatives were discussed with  the patient including but not limited to the risks of nonoperative treatment, versus surgical intervention including infection, bleeding, nerve injury,  blood clots, cardiopulmonary complications, morbidity, mortality, among others, and they were willing to proceed.   Marianna Payment, MD   01/08/2016 10:31 AM

## 2016-01-08 NOTE — Anesthesia Preprocedure Evaluation (Signed)
Anesthesia Evaluation  Patient identified by MRN, date of birth, ID band Patient awake    Reviewed: Allergy & Precautions, NPO status , Patient's Chart, lab work & pertinent test results  History of Anesthesia Complications Negative for: history of anesthetic complications  Airway Mallampati: III  TM Distance: >3 FB Neck ROM: Full    Dental  (+) Upper Dentures, Teeth Intact   Pulmonary COPD,  COPD inhaler, former smoker,    breath sounds clear to auscultation       Cardiovascular hypertension, Pt. on medications and Pt. on home beta blockers (-) angina(-) Past MI and (-) CHF  Rhythm:Regular     Neuro/Psych negative neurological ROS  negative psych ROS   GI/Hepatic negative GI ROS, Neg liver ROS,   Endo/Other  diabetes, Type 2, Oral Hypoglycemic Agents  Renal/GU Renal InsufficiencyRenal disease     Musculoskeletal  (+) Arthritis ,   Abdominal   Peds  Hematology   Anesthesia Other Findings   Reproductive/Obstetrics                             Anesthesia Physical Anesthesia Plan  ASA: III  Anesthesia Plan: Spinal   Post-op Pain Management:    Induction:   Airway Management Planned: Natural Airway, Nasal Cannula and Simple Face Mask  Additional Equipment: None  Intra-op Plan:   Post-operative Plan:   Informed Consent: I have reviewed the patients History and Physical, chart, labs and discussed the procedure including the risks, benefits and alternatives for the proposed anesthesia with the patient or authorized representative who has indicated his/her understanding and acceptance.   Dental advisory given  Plan Discussed with: CRNA and Surgeon  Anesthesia Plan Comments:         Anesthesia Quick Evaluation

## 2016-01-09 ENCOUNTER — Encounter (HOSPITAL_COMMUNITY): Payer: Self-pay | Admitting: Orthopaedic Surgery

## 2016-01-09 LAB — BASIC METABOLIC PANEL
ANION GAP: 8 (ref 5–15)
BUN: 19 mg/dL (ref 6–20)
CALCIUM: 8.8 mg/dL — AB (ref 8.9–10.3)
CO2: 28 mmol/L (ref 22–32)
Chloride: 101 mmol/L (ref 101–111)
Creatinine, Ser: 1.45 mg/dL — ABNORMAL HIGH (ref 0.61–1.24)
GFR calc Af Amer: 57 mL/min — ABNORMAL LOW (ref >60)
GFR calc non Af Amer: 49 mL/min — ABNORMAL LOW (ref >60)
GLUCOSE: 151 mg/dL — AB (ref 65–99)
POTASSIUM: 4.6 mmol/L (ref 3.5–5.1)
Sodium: 137 mmol/L (ref 135–145)

## 2016-01-09 LAB — CBC
HEMATOCRIT: 34.4 % — AB (ref 39.0–52.0)
Hemoglobin: 10.9 g/dL — ABNORMAL LOW (ref 13.0–17.0)
MCH: 28.8 pg (ref 26.0–34.0)
MCHC: 31.7 g/dL (ref 30.0–36.0)
MCV: 91 fL (ref 78.0–100.0)
Platelets: 181 10*3/uL (ref 150–400)
RBC: 3.78 MIL/uL — ABNORMAL LOW (ref 4.22–5.81)
RDW: 13.4 % (ref 11.5–15.5)
WBC: 10.2 10*3/uL (ref 4.0–10.5)

## 2016-01-09 LAB — GLUCOSE, CAPILLARY
Glucose-Capillary: 142 mg/dL — ABNORMAL HIGH (ref 65–99)
Glucose-Capillary: 136 mg/dL — ABNORMAL HIGH (ref 65–99)

## 2016-01-09 NOTE — Progress Notes (Signed)
Orthopedic Tech Progress Note Patient Details:  Chris Washington 06-10-1950 [id number]  Ortho Devices Ortho Device/Splint Location: LLE footsie roll Ortho Device/Splint Interventions: Ordered, Application   Braulio Bosch 01/09/2016, 8:22 PM

## 2016-01-09 NOTE — Progress Notes (Signed)
   Subjective:  Patient reports pain as marked when ambulating.  Objective:   VITALS:   Vitals:   01/08/16 2100 01/08/16 2210 01/09/16 0206 01/09/16 0430  BP:  126/72 129/71 101/66  Pulse:  76 69 66  Resp:  15 16 15   Temp:  98 F (36.7 C) 98.6 F (37 C) 97.8 F (36.6 C)  TempSrc:  Oral Oral Axillary  SpO2: 96% 93% 98% 93%  Weight:      Height:        Neurologically intact Neurovascular intact Sensation intact distally Intact pulses distally Dorsiflexion/Plantar flexion intact Incision: dressing C/D/I and no drainage No cellulitis present Compartment soft   Lab Results  Component Value Date   WBC 10.2 01/09/2016   HGB 10.9 (L) 01/09/2016   HCT 34.4 (L) 01/09/2016   MCV 91.0 01/09/2016   PLT 181 01/09/2016     Assessment/Plan:  1 Day Post-Op   - Expected postop acute blood loss anemia - will monitor for symptoms - Up with PT/OT - did well with PT today - DVT ppx - SCDs, ambulation, aspirin - WBAT operative extremity - Pain control - Discharge planning - anticipate dc home sat with HHPT  Marianna Payment 01/09/2016, 10:44 AM [phone]

## 2016-01-09 NOTE — Evaluation (Signed)
Physical Therapy Evaluation Patient Details Name: KASSEN FINE MRN: [id number] DOB: [birth year] Today's Date: 01/09/2016   History of Present Illness  Patient is a 65 y/o male with hx of spine surgery, HTN, DM, COPD presents s/p left TKA.  Clinical Impression  Patient presents with pain and post surgical deficits LLE s/p left TKA. Tolerated gait training and transfers with Min guard assist for safety. Sp02 dropped to 88% on RA during mobility. Instructed pt in exercises to perform during the day. Plan to bring handout for PM session. Pt has 10-88 degrees knee AROM. Pt plans to discharge home with support of spouse and daughter. Will follow acutely to maximize independence and mobility prior to return home.    Follow Up Recommendations Home health PT;Supervision - Intermittent    Equipment Recommendations  None recommended by PT    Recommendations for Other Services OT consult     Precautions / Restrictions Precautions Precautions: Knee Precaution Booklet Issued: No Precaution Comments: Reviewed no pillow under knee and precautions. Watch 02. Restrictions Weight Bearing Restrictions: Yes LLE Weight Bearing: Weight bearing as tolerated      Mobility  Bed Mobility Overal bed mobility: Needs Assistance Bed Mobility: Supine to Sit     Supine to sit: Modified independent (Device/Increase time);HOB elevated     General bed mobility comments: No assist needed. USe of rail.  Transfers Overall transfer level: Needs assistance Equipment used: Rolling walker (2 wheeled) Transfers: Sit to/from Stand Sit to Stand: Min guard         General transfer comment: Min guard for safety. Cues for hand placement. Transferred to chair post ambulation bout.  Ambulation/Gait Ambulation/Gait assistance: Min guard Ambulation Distance (Feet): 100 Feet Assistive device: Rolling walker (2 wheeled) Gait Pattern/deviations: Step-through pattern;Decreased stance time - left;Decreased step length -  right;Trunk flexed Gait velocity: decreased Gait velocity interpretation: Below normal speed for age/gender General Gait Details: Cues for knee extension during stance phase to activate quads. Sp02 88% on RA, resolved with seated rest break.   Stairs            Wheelchair Mobility    Modified Rankin (Stroke Patients Only)       Balance Overall balance assessment: Needs assistance Sitting-balance support: Feet supported;No upper extremity supported Sitting balance-Leahy Scale: Good     Standing balance support: During functional activity Standing balance-Leahy Scale: Fair Standing balance comment: Able to stand unsupported statically but requires UE support for dynamic standing.                             Pertinent Vitals/Pain Pain Assessment: 0-10 Pain Score: 5  (5-8) Pain Location: left knee Pain Descriptors / Indicators: Operative site guarding;Sore Pain Intervention(s): Monitored during session;RN gave pain meds during session;Repositioned    Home Living Family/patient expects to be discharged to:: Private residence Living Arrangements: Spouse/significant other;Children Available Help at Discharge: Family;Available 24 hours/day Type of Home: House Home Access: Stairs to enter Entrance Stairs-Rails: None (column) Entrance Stairs-Number of Steps: 3 Home Layout: One level Home Equipment: Walker - 2 wheels;Crutches;Cane - single point;Bedside commode      Prior Function Level of Independence: Independent         Comments: 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     Hand Dominance        Extremity/Trunk Assessment   Upper Extremity Assessment: Defer to OT evaluation           Lower Extremity Assessment:  LLE deficits/detail   LLE Deficits / Details: Limited AROM/strength secondary to post op and pain. Able to perform SLR with extension lag.      Communication   Communication: No difficulties  Cognition Arousal/Alertness:  Awake/alert Behavior During Therapy: WFL for tasks assessed/performed Overall Cognitive Status: Within Functional Limits for tasks assessed                      General Comments      Exercises Total Joint Exercises Ankle Circles/Pumps: Both;15 reps;Seated Quad Sets: Both;10 reps;Seated Heel Slides: Left;5 reps;Seated Goniometric ROM: 10-88 degrees knee AROM      Assessment/Plan    PT Assessment Patient needs continued PT services  PT Diagnosis Difficulty walking;Acute pain   PT Problem List Decreased strength;Decreased mobility;Decreased range of motion;Decreased knowledge of precautions;Decreased activity tolerance;Cardiopulmonary status limiting activity;Decreased balance;Impaired sensation;Pain  PT Treatment Interventions Therapeutic activities;Gait training;Stair training;Balance training;Functional mobility training;Patient/family education;Therapeutic exercise   PT Goals (Current goals can be found in the Care Plan section) Acute Rehab PT Goals Patient Stated Goal: to get better and go home PT Goal Formulation: With patient Time For Goal Achievement: 01/23/16 Potential to Achieve Goals: Good    Frequency 7X/week   Barriers to discharge        Co-evaluation               End of Session Equipment Utilized During Treatment: Gait belt Activity Tolerance: Patient tolerated treatment well Patient left: in chair;with call bell/phone within reach Nurse Communication: Mobility status         Time: 0917-0950 PT Time Calculation (min) (ACUTE ONLY): 33 min   Charges:   PT Evaluation $PT Eval Low Complexity: 1 Procedure PT Treatments $Gait Training: 8-22 mins   PT G Codes:        Rozanna Cormany A Mandell Pangborn 01/09/2016, 10:00 AM Wray Kearns, [place], DPT [phone]

## 2016-01-09 NOTE — Progress Notes (Signed)
Occupational Therapy Evaluation and Discharge Patient Details Name: Chris Washington MRN: [id number] DOB: 12/10/1950 Today's Date: 01/09/2016    History of Present Illness Patient is a 65 y/o male with hx of spine surgery, HTN, DM, COPD presents s/p left TKA.   Clinical Impression   Pt educated in use of AE and compensatory strategies for LB bathing and dressing. Educated in use of 3 in 1 over toilet, safe footwear, transporting items safely with walker and technique for shower transfer. Pt verbalizing understanding of all instruction. Pt with excellent family support. No further OT needs.    Follow Up Recommendations  No OT follow up    Equipment Recommendations  None recommended by OT    Recommendations for Other Services       Precautions / Restrictions Precautions Precautions: Knee Precaution Booklet Issued: No Precaution Comments: Reviewed no pillow under knee and precautions. Watch 02. Restrictions Weight Bearing Restrictions: Yes LLE Weight Bearing: Weight bearing as tolerated      Mobility Bed Mobility Overal bed mobility: Needs Assistance Bed Mobility: Supine to Sit     Supine to sit: Modified independent (Device/Increase time);HOB elevated     General bed mobility comments: pt in chair  Transfers Overall transfer level: Needs assistance Equipment used: Rolling walker (2 wheeled) Transfers: Sit to/from Stand Sit to Stand: Supervision         General transfer comment: from recliner, cues for hand placement    Balance Overall balance assessment: Needs assistance Sitting-balance support: Feet supported;No upper extremity supported Sitting balance-Leahy Scale: Good     Standing balance support: During functional activity Standing balance-Leahy Scale: Fair Standing balance comment: Able to stand unsupported statically but requires UE support for dynamic standing.                            ADL Overall ADL's : Needs  assistance/impaired Eating/Feeding: Independent;Sitting   Grooming: Wash/dry hands;Wash/dry face;Sitting;Set up   Upper Body Bathing: Set up;Sitting   Lower Body Bathing: Minimal assistance;Sit to/from stand Lower Body Bathing Details (indicate cue type and reason): recommended long handled bath sponge Upper Body Dressing : Set up;Sitting   Lower Body Dressing: Minimal assistance;Sit to/from stand Lower Body Dressing Details (indicate cue type and reason): educated in use of AE and compensatory strategies, has AE from his brother Toilet Transfer: Supervision/safety;Ambulation;RW   Toileting- Clothing Manipulation and Hygiene: Supervision/safety;Sit to/from Nurse, children's Details (indicate cue type and reason): verbally educated pt in technique for shower transfer         Vision     Perception     Praxis      Pertinent Vitals/Pain Pain Assessment: 0-10 Pain Score: 5  Pain Location: L knee Pain Descriptors / Indicators: Operative site guarding;Sore Pain Intervention(s): Repositioned;Ice applied;Monitored during session     Hand Dominance Right   Extremity/Trunk Assessment Upper Extremity Assessment Upper Extremity Assessment: Overall WFL for tasks assessed   Lower Extremity Assessment Lower Extremity Assessment: Defer to PT evaluation LLE Deficits / Details: Limited AROM/strength secondary to post op and pain. Able to perform SLR with extension lag.  LLE Sensation: decreased light touch (plantar surface of foot)       Communication Communication Communication: No difficulties   Cognition Arousal/Alertness: Awake/alert Behavior During Therapy: WFL for tasks assessed/performed Overall Cognitive Status: Within Functional Limits for tasks assessed  General Comments       Exercises       Shoulder Instructions      Home Living Family/patient expects to be discharged to:: Private residence Living Arrangements:  Spouse/significant other;Children Available Help at Discharge: Family;Available 24 hours/day Type of Home: House Home Access: Stairs to enter [employer] of Steps: 3 Entrance Stairs-Rails: None (column) Home Layout: One level     Bathroom Shower/Tub: Occupational psychologist: Handicapped height     Home Equipment: Environmental consultant - 2 wheels;Crutches;Cane - single point;Bedside commode;Shower seat - built Hotel manager: Reacher;Sock aid;Long-handled Conservation officer, historic buildings        Prior Functioning/Environment Level of Independence: Independent        Comments: Owns a Copywriter, 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    OT Diagnosis: Generalized weakness;Acute pain   OT Problem List:     OT Treatment/Interventions:      OT Goals(Current goals can be found in the care plan section) Acute Rehab OT Goals Patient Stated Goal: to get better and go home  OT Frequency:     Barriers to D/C:            Co-evaluation              End of Session Equipment Utilized During Treatment: Gait belt;Rolling walker  Activity Tolerance: Patient tolerated treatment well Patient left: in chair;with call bell/phone within reach;with family/visitor present (eating lunch)   Time: [id number] OT Time Calculation (min): 16 min Charges:  OT General Charges $OT Visit: 1 Procedure OT Evaluation $OT Eval Low Complexity: 1 Procedure G-Codes:    Malka So 01/09/2016, 12:45 PM  [phone]

## 2016-01-09 NOTE — Progress Notes (Signed)
Physical Therapy Treatment Patient Details Name: Chris Washington MRN: [id number] DOB: 1951/02/07 Today's Date: 01/09/2016    History of Present Illness Patient is a 65 y/o male with hx of spine surgery, HTN, DM, COPD presents s/p left TKA.    PT Comments    Pt performed increased gait and reviewed supine exercises in HEP.  Pt will required stair training in am in prep for d/c home.    Follow Up Recommendations  Home health PT;Supervision - Intermittent     Equipment Recommendations  None recommended by PT    Recommendations for Other Services       Precautions / Restrictions Precautions Precautions: Knee Precaution Booklet Issued: No Precaution Comments: Reviewed no pillow under knee and precautions. Watch 02. Restrictions Weight Bearing Restrictions: Yes LLE Weight Bearing: Weight bearing as tolerated    Mobility  Bed Mobility Overal bed mobility: Modified Independent Bed Mobility: Supine to Sit;Sit to Supine     Supine to sit: Modified independent (Device/Increase time);HOB elevated     General bed mobility comments: Good technique observed with increased time secondary to fatigue and pain.    Transfers Overall transfer level: Needs assistance Equipment used: Rolling walker (2 wheeled) Transfers: Sit to/from Stand Sit to Stand: Supervision         General transfer comment: from recliner, cues for hand placement  Ambulation/Gait Ambulation/Gait assistance: Min guard   Assistive device: Rolling walker (2 wheeled)   Gait velocity: decreased Gait velocity interpretation: Below normal speed for age/gender General Gait Details: Cues for knee extension during stance phase to activate quads. Sp02 88%-92% on RA, improved as gait progressed and cued for pursed lip breathing.     Stairs            Wheelchair Mobility    Modified Rankin (Stroke Patients Only)       Balance Overall balance assessment: Needs assistance   Sitting balance-Leahy Scale:  Good       Standing balance-Leahy Scale: Fair                      Cognition Arousal/Alertness: Awake/alert Behavior During Therapy: WFL for tasks assessed/performed Overall Cognitive Status: Within Functional Limits for tasks assessed                      Exercises Total Joint Exercises Ankle Circles/Pumps: Both;15 reps;Supine Quad Sets: 10 reps;Left;Supine Short Arc Quad: AROM;Left;10 reps;Supine Heel Slides: Left;5 reps;Supine;AROM Hip ABduction/ADduction: AROM;Left;10 reps;Supine Straight Leg Raises: AROM;Left;10 reps;Supine    General Comments        Pertinent Vitals/Pain Pain Assessment: 0-10 Pain Score: 5  Pain Location: L knee Pain Descriptors / Indicators: Operative site guarding;Sore Pain Intervention(s): Limited activity within patient's tolerance;Repositioned    Home Living                      Prior Function            PT Goals (current goals can now be found in the care plan section) Acute Rehab PT Goals Patient Stated Goal: to get better and go home Potential to Achieve Goals: Good Progress towards PT goals: Progressing toward goals    Frequency  7X/week    PT Plan Current plan remains appropriate    Co-evaluation             End of Session Equipment Utilized During Treatment: Gait belt Activity Tolerance: Patient tolerated treatment well Patient left: in chair;with call bell/phone within  reach     Time: [id number] PT Time Calculation (min) (ACUTE ONLY): 23 min  Charges:  $Gait Training: 8-22 mins $Therapeutic Exercise: 8-22 mins                    G Codes:      Cristela Blue 02/04/2016, 4:20 PM  Governor Rooks, PTA pager [phone]

## 2016-01-09 NOTE — Anesthesia Postprocedure Evaluation (Signed)
Anesthesia Post Note  Patient: Chris Washington  Procedure(s) Performed: Procedure(s) (LRB): LEFT TOTAL KNEE ARTHROPLASTY (Left)  Patient location during evaluation: PACU Anesthesia Type: Spinal Level of consciousness: awake Pain management: pain level controlled Vital Signs Assessment: post-procedure vital signs reviewed and stable Respiratory status: spontaneous breathing Cardiovascular status: stable Postop Assessment: no signs of nausea or vomiting and spinal receding Anesthetic complications: no    Last Vitals:  Vitals:   01/09/16 0206 01/09/16 0430  BP: 129/71 101/66  Pulse: 69 66  Resp: 16 15  Temp: 37 C 36.6 C    Last Pain:  Vitals:   01/09/16 1039  TempSrc:   PainSc: 0-No pain                 Makynli Stills

## 2016-01-10 LAB — GLUCOSE, CAPILLARY
GLUCOSE-CAPILLARY: 177 mg/dL — AB (ref 65–99)
GLUCOSE-CAPILLARY: 163 mg/dL — AB (ref 65–99)

## 2016-01-10 NOTE — Discharge Summary (Signed)
Physician Discharge Summary  Patient ID: LAQUINN VEASEY MRN: [id number] DOB/AGE: 65-Oct-1952 65 y.o.  Admit date: 01/08/2016 Discharge date: 01/10/2016  Admission Diagnoses:Osteoarthritis left knee  Discharge Diagnoses:  Active Problems:   Status post left knee replacement   Total knee replacement status   Discharged Condition: stable  Hospital Course: Patient underwent total knee arthroplasty postoperatively he progressed well and was discharged to home in stable condition.  Consults: None  Significant Diagnostic Studies: labs: Routine labs  Treatments: surgery: See operative note  Discharge Exam: Blood pressure (!) 150/81, pulse 69, temperature 97.8 F (36.6 C), temperature source Oral, resp. rate 20, height 5\' 10"  (1.778 m), weight 88.9 kg (196 lb), SpO2 96 %. Incision/Wound: Dressing clean and dry  Disposition: 01-Home or Self Care    Follow-up Information    Marianna Payment, MD Follow up in 2 week(s).   Specialty:  Orthopedic Surgery Why:  For suture removal, For wound re-check Contact information: [address] [place] [address] [phone]           Signed: Newt Minion 01/10/2016, 10:44 AM

## 2016-01-10 NOTE — Progress Notes (Signed)
Patient was discharged home with wife and daughter. Informed patient we did not have an order for his home health physical therapy. He stated understanding to call his surgeon's office on Monday to set this up. Florian Buff, RN

## 2016-01-10 NOTE — Progress Notes (Signed)
CM received call from RN to please arrange HHPT.  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 RN, Rodena Piety,  states she called MD who has directed RN to have pt set up [facility] through MD office on Monday. CM met with pt to see if he needed DME; pt states he has rolling walker at home and denies need for additional DMe. No other CM needs were communicated.

## 2016-01-10 NOTE — Progress Notes (Signed)
Physical Therapy Treatment Patient Details Name: GAYLEN MENTINK MRN: [id number] DOB: 1950-12-17 Today's Date: 01/10/2016    History of Present Illness Patient is a 65 y/o male with hx of spine surgery, HTN, DM, COPD presents s/p left TKA.    PT Comments    Pt progressing well with therapy.  Performed gait training and therapeutic exercise. Pt remains to fatigue and presents with DOE, O2 sats improved to 94% on RA during tx.  Pt ready to d/c from a mobility standpoint.  Will f/u in pm if patient remains hospitalized.     Follow Up Recommendations  Home health PT;Supervision - Intermittent     Equipment Recommendations  None recommended by PT    Recommendations for Other Services       Precautions / Restrictions Precautions Precautions: Knee Precaution Booklet Issued: No Precaution Comments: Reviewed no pillow under knee and precautions. Watch 02. Restrictions Weight Bearing Restrictions: Yes LLE Weight Bearing: Weight bearing as tolerated    Mobility  Bed Mobility Overal bed mobility: Modified Independent Bed Mobility: Supine to Sit;Sit to Supine     Supine to sit: Modified independent (Device/Increase time);HOB elevated     General bed mobility comments: Good technique observed with increased time secondary to fatigue and pain.    Transfers Overall transfer level: Needs assistance Equipment used: Rolling walker (2 wheeled) Transfers: Sit to/from Stand Sit to Stand: Supervision         General transfer comment: cues for hand placement, pt remains to reach for RW.    Ambulation/Gait Ambulation/Gait assistance: Min guard Ambulation Distance (Feet): 200 Feet Assistive device: Rolling walker (2 wheeled) Gait Pattern/deviations: Step-through pattern;Decreased stance time - left;Decreased step length - right Gait velocity: decreased Gait velocity interpretation: Below normal speed for age/gender General Gait Details: Pt O2 sats 94% on RA.  Pt required cues for  upper trunk control and RW safety.     Stairs Stairs: Yes Stairs assistance: Min assist Stair Management: No rails;With walker Number of Stairs: 3 General stair comments: Cues for sequencing and RW placement.  Pt verbalized understanding.    Wheelchair Mobility    Modified Rankin (Stroke Patients Only)       Balance                                    Cognition Arousal/Alertness: Awake/alert Behavior During Therapy: WFL for tasks assessed/performed Overall Cognitive Status: Within Functional Limits for tasks assessed                      Exercises Total Joint Exercises Ankle Circles/Pumps: Both;15 reps;Supine Quad Sets: 10 reps;Left;Supine Short Arc Quad: AROM;Left;10 reps;Supine Heel Slides: Left;5 reps;Supine;AROM Hip ABduction/ADduction: AROM;Left;10 reps;Supine Straight Leg Raises: AROM;Left;10 reps;Supine Long Arc Quad: AROM;10 reps;Seated Goniometric ROM: 93 degrees L knee AROM.      General Comments        Pertinent Vitals/Pain Pain Assessment: 0-10 Pain Score: 5  Pain Location: L knee Pain Descriptors / Indicators: Grimacing;Guarding;Operative site guarding;Sore Pain Intervention(s): Monitored during session;Repositioned;Patient requesting pain meds-RN notified;Ice applied    Home Living                      Prior Function            PT Goals (current goals can now be found in the care plan section) Acute Rehab PT Goals Patient Stated Goal: to get better and  go home Potential to Achieve Goals: Good    Frequency  7X/week    PT Plan Current plan remains appropriate    Co-evaluation             End of Session Equipment Utilized During Treatment: Gait belt Activity Tolerance: Patient tolerated treatment well Patient left: in chair;with call bell/phone within reach     Time: 1015-1043 PT Time Calculation (min) (ACUTE ONLY): 28 min  Charges:  $Gait Training: 8-22 mins $Therapeutic Exercise: 8-22  mins                    G Codes:      Cristela Blue 2016-02-08, 10:51 AM  Governor Rooks, PTA pager [phone]

## 2016-01-12 DIAGNOSIS — J189 Pneumonia, unspecified organism: Secondary | ICD-10-CM | POA: Diagnosis not present

## 2016-01-12 DIAGNOSIS — R0602 Shortness of breath: Secondary | ICD-10-CM | POA: Diagnosis not present

## 2016-01-12 DIAGNOSIS — I2510 Atherosclerotic heart disease of native coronary artery without angina pectoris: Secondary | ICD-10-CM | POA: Diagnosis not present

## 2016-01-12 DIAGNOSIS — I10 Essential (primary) hypertension: Secondary | ICD-10-CM | POA: Diagnosis not present

## 2016-01-12 DIAGNOSIS — M47814 Spondylosis without myelopathy or radiculopathy, thoracic region: Secondary | ICD-10-CM | POA: Diagnosis not present

## 2016-01-12 DIAGNOSIS — R0902 Hypoxemia: Secondary | ICD-10-CM | POA: Diagnosis not present

## 2016-01-12 DIAGNOSIS — I493 Ventricular premature depolarization: Secondary | ICD-10-CM | POA: Diagnosis not present

## 2016-01-12 DIAGNOSIS — R069 Unspecified abnormalities of breathing: Secondary | ICD-10-CM | POA: Diagnosis not present

## 2016-01-12 DIAGNOSIS — E119 Type 2 diabetes mellitus without complications: Secondary | ICD-10-CM | POA: Diagnosis not present

## 2016-01-12 DIAGNOSIS — J181 Lobar pneumonia, unspecified organism: Secondary | ICD-10-CM | POA: Diagnosis not present

## 2016-01-12 DIAGNOSIS — Z87891 Personal history of nicotine dependence: Secondary | ICD-10-CM | POA: Diagnosis not present

## 2016-01-12 DIAGNOSIS — I252 Old myocardial infarction: Secondary | ICD-10-CM | POA: Diagnosis not present

## 2016-01-12 DIAGNOSIS — R0989 Other specified symptoms and signs involving the circulatory and respiratory systems: Secondary | ICD-10-CM | POA: Diagnosis not present

## 2016-01-12 DIAGNOSIS — J9811 Atelectasis: Secondary | ICD-10-CM | POA: Diagnosis not present

## 2016-01-12 DIAGNOSIS — J9601 Acute respiratory failure with hypoxia: Secondary | ICD-10-CM | POA: Diagnosis not present

## 2016-01-12 DIAGNOSIS — M25462 Effusion, left knee: Secondary | ICD-10-CM | POA: Diagnosis not present

## 2016-01-20 DIAGNOSIS — R042 Hemoptysis: Secondary | ICD-10-CM | POA: Diagnosis not present

## 2016-01-20 DIAGNOSIS — J441 Chronic obstructive pulmonary disease with (acute) exacerbation: Secondary | ICD-10-CM | POA: Diagnosis not present

## 2016-01-22 DIAGNOSIS — M1712 Unilateral primary osteoarthritis, left knee: Secondary | ICD-10-CM | POA: Diagnosis not present

## 2016-02-07 ENCOUNTER — Encounter (HOSPITAL_COMMUNITY): Payer: Self-pay | Admitting: *Deleted

## 2016-02-07 ENCOUNTER — Emergency Department (HOSPITAL_COMMUNITY): Payer: Medicare HMO

## 2016-02-07 ENCOUNTER — Emergency Department (HOSPITAL_COMMUNITY)
Admission: EM | Admit: 2016-02-07 | Discharge: 2016-02-08 | Disposition: A | Discharge status: Home / Self Care | Payer: Medicare HMO | Attending: Emergency Medicine | Admitting: Emergency Medicine

## 2016-02-07 DIAGNOSIS — J189 Pneumonia, unspecified organism: Secondary | ICD-10-CM | POA: Diagnosis not present

## 2016-02-07 DIAGNOSIS — Z7982 Long term (current) use of aspirin: Secondary | ICD-10-CM | POA: Diagnosis not present

## 2016-02-07 DIAGNOSIS — Z96652 Presence of left artificial knee joint: Secondary | ICD-10-CM | POA: Insufficient documentation

## 2016-02-07 DIAGNOSIS — E041 Nontoxic single thyroid nodule: Secondary | ICD-10-CM | POA: Insufficient documentation

## 2016-02-07 DIAGNOSIS — Z7984 Long term (current) use of oral hypoglycemic drugs: Secondary | ICD-10-CM | POA: Diagnosis not present

## 2016-02-07 DIAGNOSIS — I10 Essential (primary) hypertension: Secondary | ICD-10-CM | POA: Insufficient documentation

## 2016-02-07 DIAGNOSIS — R0602 Shortness of breath: Secondary | ICD-10-CM | POA: Diagnosis not present

## 2016-02-07 DIAGNOSIS — R079 Chest pain, unspecified: Secondary | ICD-10-CM | POA: Diagnosis not present

## 2016-02-07 DIAGNOSIS — E119 Type 2 diabetes mellitus without complications: Secondary | ICD-10-CM | POA: Insufficient documentation

## 2016-02-07 DIAGNOSIS — J449 Chronic obstructive pulmonary disease, unspecified: Secondary | ICD-10-CM | POA: Diagnosis not present

## 2016-02-07 DIAGNOSIS — Z87891 Personal history of nicotine dependence: Secondary | ICD-10-CM | POA: Insufficient documentation

## 2016-02-07 DIAGNOSIS — Z85828 Personal history of other malignant neoplasm of skin: Secondary | ICD-10-CM | POA: Insufficient documentation

## 2016-02-07 LAB — CBC
HCT: 35.6 % — ABNORMAL LOW (ref 39.0–52.0)
HEMOGLOBIN: 11.2 g/dL — AB (ref 13.0–17.0)
MCH: 28.3 pg (ref 26.0–34.0)
MCHC: 31.5 g/dL (ref 30.0–36.0)
MCV: 89.9 fL (ref 78.0–100.0)
PLATELETS: 231 10*3/uL (ref 150–400)
RBC: 3.96 MIL/uL — AB (ref 4.22–5.81)
RDW: 13.6 % (ref 11.5–15.5)
WBC: 10.8 10*3/uL — ABNORMAL HIGH (ref 4.0–10.5)

## 2016-02-07 LAB — BASIC METABOLIC PANEL
ANION GAP: 8 (ref 5–15)
BUN: 20 mg/dL (ref 6–20)
CALCIUM: 9.7 mg/dL (ref 8.9–10.3)
CO2: 29 mmol/L (ref 22–32)
Chloride: 103 mmol/L (ref 101–111)
Creatinine, Ser: 1.37 mg/dL — ABNORMAL HIGH (ref 0.61–1.24)
GFR calc Af Amer: >60 mL/min (ref >60)
GFR, EST NON AFRICAN AMERICAN: 53 mL/min — AB (ref >60)
GLUCOSE: 145 mg/dL — AB (ref 65–99)
Potassium: 4.5 mmol/L (ref 3.5–5.1)
SODIUM: 140 mmol/L (ref 135–145)

## 2016-02-07 MED ORDER — IOPAMIDOL (ISOVUE-370) INJECTION 76%
INTRAVENOUS | Status: AC
  Administered 2016-02-08: 100 mL
  Filled 2016-02-07: qty 100

## 2016-02-07 NOTE — ED Triage Notes (Signed)
Pt c/o jitteriness and CP for the past two days. Pt states he hasn't felt good since his knee replacement surgery. Pt reports some shortness of breath

## 2016-02-07 NOTE — ED Provider Notes (Signed)
Patient seen/examined in the Emergency Department in conjunction with Midlevel Provider [place] Patient reports chest pain.  He reports recent knee replacement Exam : awake/alert, no distress, vital signs stable Plan: due to recent knee surgery, will need evaluation for PE     Ripley Fraise, MD 02/07/16 2354

## 2016-02-07 NOTE — ED Provider Notes (Signed)
[facility] DEPT Provider Note   CSN: [id number] Arrival date & time: 02/07/16  1839     History   Chief Complaint Chief Complaint  Patient presents with  . Chest Pain    HPI Chris Washington is a 65 y.o. male.  HPI  Patient arrived to room at 10:30 am, prior to that he was still in the waiting room.  Patient with a PMH of hypertension, pneumonia, enlarged prostate, diabetes, COPD, arthritis comes to the ER with complaints of jitteriness and CP for the past two days. He reports not feeling well since he had his knee replacement surgery. He has had associated shortness of breath. The surgery was done on January 10, 2016. He was seen after his surgery for SOB and CP, they treated him for pneumonia even though they didn't find any. He says he has been progressively getting more short of breath and having worsening pain in his chest. He says the pain was very severe today.    Past Medical History:  Diagnosis Date  . Arthritis    takes gabapentin for chronic knee pain  . Basal cell carcinoma of skin of face    left  . COPD (chronic obstructive pulmonary disease) ([place])   . Diabetes mellitus without complication ([place])    Type II  . Enlarged prostate   . Hard of hearing   . History of kidney stones   . History of pneumonia   . Hypertension   . Nocturia    "controlled with medicines"    Patient Active Problem List   Diagnosis Date Noted  . Status post left knee replacement 01/08/2016  . Total knee replacement status 01/08/2016  . Controlled type 2 diabetes mellitus with stage 3 chronic kidney disease, without long-term current use of insulin ([place]) 11/13/2015  . Essential hypertension 11/13/2015  . PVC's (premature ventricular contractions) 11/13/2015    Past Surgical History:  Procedure Laterality Date  . BACK SURGERY  2014   lumbar laminectomy  . BASAL CELL CARCINOMA EXCISION     left side of face  . CARDIAC CATHETERIZATION  05/14/04   normal coronaries, LV systolic  function, renals, abdominal aorta (Dr. Myrtice Lauth, [facility])  . COLONOSCOPY    . DISTAL BICEPS TENDON REPAIR Left 11/14/2015   Procedure: LEFT DISTAL BICEPS TENDON REPAIR;  Surgeon: Leandrew Koyanagi, MD;  Location: [place] [facility];  Service: Orthopedics;  Laterality: Left;  . TOTAL KNEE ARTHROPLASTY Left 01/08/2016   Procedure: LEFT TOTAL KNEE ARTHROPLASTY;  Surgeon: Leandrew Koyanagi, MD;  Location: [place];  Service: Orthopedics;  Laterality: Left;     Home Medications    Prior to Admission medications   Medication Sig Start Date End Date Taking? Authorizing Provider  aspirin EC 81 MG tablet Take 81 mg by mouth 2 (two) times daily.   Yes Historical Provider, MD  budesonide-formoterol (SYMBICORT) 160-4.5 MCG/ACT inhaler Inhale 2 puffs into the lungs 2 (two) times daily.    Yes Historical Provider, MD  finasteride (PROSCAR) 5 MG tablet Take 5 mg by mouth every evening.  11/27/15  Yes Historical Provider, MD  gabapentin (NEURONTIN) 300 MG capsule Take 300 mg by mouth 4 (four) times daily.    Yes Historical Provider, MD  lisinopril-hydrochlorothiazide (PRINZIDE,ZESTORETIC) 20-25 MG tablet Take 1 tablet by mouth daily. 11/27/15  Yes Historical Provider, MD  metFORMIN (GLUCOPHAGE-XR) 500 MG 24 hr tablet Take 1,000 mg by mouth 2 (two) times daily.    Yes Historical Provider, MD  metoprolol succinate (TOPROL-XL)  25 MG 24 hr tablet Take 25 mg by mouth daily.   Yes Historical Provider, MD  senna-docusate (SENOKOT S) 8.6-50 MG tablet Take 1 tablet by mouth at bedtime as needed. 01/08/16  Yes Leandrew Koyanagi, MD  tamsulosin (FLOMAX) 0.4 MG CAPS Take 1 capsule (0.4 mg total) by mouth every morning. 12/29/12  Yes Newman Pies, MD  traZODone (DESYREL) 100 MG tablet Take 100 mg by mouth at bedtime. 12/10/15  Yes Historical Provider, MD  aspirin EC 325 MG tablet Take 1 tablet (325 mg total) by mouth 2 (two) times daily. Patient not taking: Reported on 02/07/2016 01/08/16   Leandrew Koyanagi, MD  cyclobenzaprine (FLEXERIL)  10 MG tablet Take 0.5-1 tablets (5-10 mg total) by mouth 2 (two) times daily as needed. 02/08/16   Katalea Ucci Carlota Raspberry, PA-C  methocarbamol (ROBAXIN) 750 MG tablet Take 1 tablet (750 mg total) by mouth 2 (two) times daily as needed for muscle spasms. Patient not taking: Reported on 02/07/2016 01/08/16   Leandrew Koyanagi, MD  naproxen (NAPROSYN) 500 MG tablet Take 1 tablet (500 mg total) by mouth 2 (two) times daily. 02/08/16   Eliska Hamil Carlota Raspberry, PA-C  ondansetron (ZOFRAN) 4 MG tablet Take 1-2 tablets (4-8 mg total) by mouth every 8 (eight) hours as needed for nausea or vomiting. Patient not taking: Reported on 02/07/2016 01/08/16   Leandrew Koyanagi, MD  oxyCODONE (OXY IR/ROXICODONE) 5 MG immediate release tablet Take 1-3 tablets (5-15 mg total) by mouth every 4 (four) hours as needed. Patient not taking: Reported on 02/07/2016 01/08/16   Leandrew Koyanagi, MD  oxyCODONE (OXYCONTIN) 10 mg 12 hr tablet Take 1 tablet (10 mg total) by mouth every 12 (twelve) hours. Patient not taking: Reported on 02/07/2016 01/08/16   Leandrew Koyanagi, MD    Family History History reviewed. No pertinent family history.  Social History Social History  Substance Use Topics  . Smoking status: Former Smoker    Quit date: 05/31/2004  . Smokeless tobacco: Never Used  . Alcohol use No     Allergies   Review of patient's allergies indicates no known allergies.   Review of Systems Review of Systems  Review of Systems All other systems negative except as documented in the HPI. All pertinent positives and negatives as reviewed in the HPI.   Physical Exam Updated Vital Signs BP 137/90   Pulse 84   Temp 97.8 F (36.6 C) (Oral)   Resp 13   Ht 5\' 10"  (1.778 m)   Wt 81.6 kg   SpO2 94%   BMI 25.83 kg/m   Physical Exam  Constitutional: He is oriented to person, place, and time. He appears well-developed and well-nourished.  HENT:  Head: Normocephalic and atraumatic.  Eyes: EOM are normal. Pupils are equal, round, and reactive to light.  Neck:  Normal range of motion.  Cardiovascular: Normal rate and regular rhythm.   Variable heart rate  Pulmonary/Chest: Effort normal and breath sounds normal. No tachypnea and no bradypnea. No respiratory distress. He exhibits no tenderness and no crepitus.  Musculoskeletal: Normal range of motion.  No le swelling  Neurological: He is alert and oriented to person, place, and time.  Skin: Skin is warm and dry.    ED Treatments / Results  Labs (all labs ordered are listed, but only abnormal results are displayed) Labs Reviewed  BASIC METABOLIC PANEL - Abnormal; Notable for the following:       Result Value   Glucose, Bld 145 (*)  Creatinine, Ser 1.37 (*)    GFR calc non Af Amer 53 (*)    All other components within normal limits  CBC - Abnormal; Notable for the following:    WBC 10.8 (*)    RBC 3.96 (*)    Hemoglobin 11.2 (*)    HCT 35.6 (*)    All other components within normal limits  TROPONIN I  I-STAT TROPOININ, ED    EKG  EKG Interpretation  Date/Time:  Saturday February 07 2016 18:44:20 EDT Ventricular Rate:  103 PR Interval:  168 QRS Duration: 78 QT Interval:  326 QTC Calculation: 427 R Axis:   28 Text Interpretation:  Sinus tachycardia with frequent Premature ventricular complexes in a pattern of bigeminy Low voltage QRS Septal infarct , age undetermined Abnormal ECG No significant change since last tracing Confirmed by [place] ([id number]) on 02/07/2016 11:15:57 PM       Radiology Dg Chest 2 View  Result Date: 02/07/2016 CLINICAL DATA:  Left knee replacement 4 weeks ago. Pneumonia. Chest pain. EXAM: CHEST  2 VIEW COMPARISON:  05/10/2013 FINDINGS: The heart size and mediastinal contours are within normal limits. Both lungs are clear. Spondylosis noted within the thoracic spine.  IMPRESSION: No active cardiopulmonary disease.   Electronically Signed   By: Kerby Moors M.D.   On: 02/07/2016 19:26   Procedures Procedures (including critical care  time)  Medications Ordered in ED Medications  iopamidol (ISOVUE-370) 76 % injection (100 mLs  Contrast Given 02/08/16 0013)     Initial Impression / Assessment and Plan / ED Course  I have reviewed the triage vital signs and the nursing notes.  Pertinent labs & imaging results that were available during my care of the patient were reviewed by me and considered in my medical decision making (see chart for details).  Clinical Course   The patients CT angio of the chest is negative for PE or any acute abnormality. Will refer to PCP for thyroid nodule.  Patient is to be discharged with recommendation to follow up with PCP in regards to today's hospital visit. Chest pain is not likely of cardiac or pulmonary etiology d/t presentation, perc negative, VSS, no tracheal deviation, no JVD or new murmur, RRR, breath sounds equal bilaterally, EKG without acute abnormalities, negative troponin, and negative CXR. Pt has been advised to return to the ED is CP becomes exertional, associated with diaphoresis or nausea, radiates to left jaw/arm, worsens or becomes concerning in any way. Pt appears reliable for follow up and is agreeable to discharge.   Case has been discussed with and seen by Dr. Christy Gentles who has seen the patient as well and who agrees with the above plan to discharge.    Final Clinical Impressions(s) / ED Diagnoses   Final diagnoses:  Chest pain, unspecified chest pain type    New Prescriptions New Prescriptions   CYCLOBENZAPRINE (FLEXERIL) 10 MG TABLET    Take 0.5-1 tablets (5-10 mg total) by mouth 2 (two) times daily as needed.   NAPROXEN (NAPROSYN) 500 MG TABLET    Take 1 tablet (500 mg total) by mouth 2 (two) times daily.     Delos Haring, PA-C 02/08/16 0126    Delos Haring, PA-C 02/08/16 0127

## 2016-02-08 ENCOUNTER — Emergency Department (HOSPITAL_COMMUNITY): Payer: Medicare HMO

## 2016-02-08 ENCOUNTER — Other Ambulatory Visit (HOSPITAL_COMMUNITY): Payer: Medicare HMO

## 2016-02-08 DIAGNOSIS — R079 Chest pain, unspecified: Secondary | ICD-10-CM | POA: Diagnosis not present

## 2016-02-08 DIAGNOSIS — E041 Nontoxic single thyroid nodule: Secondary | ICD-10-CM | POA: Diagnosis not present

## 2016-02-08 DIAGNOSIS — Z7984 Long term (current) use of oral hypoglycemic drugs: Secondary | ICD-10-CM | POA: Diagnosis not present

## 2016-02-08 DIAGNOSIS — I10 Essential (primary) hypertension: Secondary | ICD-10-CM | POA: Diagnosis not present

## 2016-02-08 DIAGNOSIS — E119 Type 2 diabetes mellitus without complications: Secondary | ICD-10-CM | POA: Diagnosis not present

## 2016-02-08 DIAGNOSIS — Z85828 Personal history of other malignant neoplasm of skin: Secondary | ICD-10-CM | POA: Diagnosis not present

## 2016-02-08 DIAGNOSIS — Z96652 Presence of left artificial knee joint: Secondary | ICD-10-CM | POA: Diagnosis not present

## 2016-02-08 DIAGNOSIS — R0602 Shortness of breath: Secondary | ICD-10-CM | POA: Diagnosis not present

## 2016-02-08 DIAGNOSIS — J449 Chronic obstructive pulmonary disease, unspecified: Secondary | ICD-10-CM | POA: Diagnosis not present

## 2016-02-08 DIAGNOSIS — Z7982 Long term (current) use of aspirin: Secondary | ICD-10-CM | POA: Diagnosis not present

## 2016-02-08 DIAGNOSIS — Z87891 Personal history of nicotine dependence: Secondary | ICD-10-CM | POA: Diagnosis not present

## 2016-02-08 LAB — TROPONIN I: Troponin I: <0.03 ng/mL (ref <0.03)

## 2016-02-08 MED ORDER — CYCLOBENZAPRINE HCL 10 MG PO TABS: 5.0000 mg | ORAL_TABLET | Freq: Two times a day (BID) | ORAL | 0 refills | Status: DC | PRN

## 2016-02-08 MED ORDER — NAPROXEN 500 MG PO TABS: 500.0000 mg | ORAL_TABLET | Freq: Two times a day (BID) | ORAL | 0 refills | Status: DC

## 2016-02-08 NOTE — ED Notes (Signed)
Transported to CT 

## 2016-02-11 DIAGNOSIS — Z96652 Presence of left artificial knee joint: Secondary | ICD-10-CM | POA: Diagnosis not present

## 2016-02-12 DIAGNOSIS — J189 Pneumonia, unspecified organism: Secondary | ICD-10-CM | POA: Diagnosis not present

## 2016-02-12 DIAGNOSIS — J9601 Acute respiratory failure with hypoxia: Secondary | ICD-10-CM | POA: Diagnosis not present

## 2016-02-16 DIAGNOSIS — Z96652 Presence of left artificial knee joint: Secondary | ICD-10-CM | POA: Diagnosis not present

## 2016-02-17 DIAGNOSIS — I10 Essential (primary) hypertension: Secondary | ICD-10-CM | POA: Diagnosis not present

## 2016-02-17 DIAGNOSIS — R079 Chest pain, unspecified: Secondary | ICD-10-CM | POA: Diagnosis not present

## 2016-02-17 DIAGNOSIS — Z6828 Body mass index (BMI) 28.0-28.9, adult: Secondary | ICD-10-CM | POA: Diagnosis not present

## 2016-02-17 DIAGNOSIS — E041 Nontoxic single thyroid nodule: Secondary | ICD-10-CM | POA: Diagnosis not present

## 2016-02-17 DIAGNOSIS — R69 Illness, unspecified: Secondary | ICD-10-CM | POA: Diagnosis not present

## 2016-02-18 DIAGNOSIS — Z96652 Presence of left artificial knee joint: Secondary | ICD-10-CM | POA: Diagnosis not present

## 2016-02-19 DIAGNOSIS — M25562 Pain in left knee: Secondary | ICD-10-CM | POA: Diagnosis not present

## 2016-02-27 DIAGNOSIS — E041 Nontoxic single thyroid nodule: Secondary | ICD-10-CM | POA: Diagnosis not present

## 2016-02-27 DIAGNOSIS — R0602 Shortness of breath: Secondary | ICD-10-CM | POA: Diagnosis not present

## 2016-02-27 DIAGNOSIS — Z1389 Encounter for screening for other disorder: Secondary | ICD-10-CM | POA: Diagnosis not present

## 2016-02-27 DIAGNOSIS — R000 Tachycardia, unspecified: Secondary | ICD-10-CM | POA: Diagnosis not present

## 2016-02-27 DIAGNOSIS — Z96652 Presence of left artificial knee joint: Secondary | ICD-10-CM | POA: Diagnosis not present

## 2016-02-27 DIAGNOSIS — I10 Essential (primary) hypertension: Secondary | ICD-10-CM | POA: Diagnosis not present

## 2016-02-27 DIAGNOSIS — R69 Illness, unspecified: Secondary | ICD-10-CM | POA: Diagnosis not present

## 2016-02-27 DIAGNOSIS — E042 Nontoxic multinodular goiter: Secondary | ICD-10-CM | POA: Diagnosis not present

## 2016-02-27 DIAGNOSIS — J449 Chronic obstructive pulmonary disease, unspecified: Secondary | ICD-10-CM | POA: Diagnosis not present

## 2016-02-27 DIAGNOSIS — G4700 Insomnia, unspecified: Secondary | ICD-10-CM | POA: Diagnosis not present

## 2016-02-27 DIAGNOSIS — Z0000 Encounter for general adult medical examination without abnormal findings: Secondary | ICD-10-CM | POA: Diagnosis not present

## 2016-02-27 DIAGNOSIS — E119 Type 2 diabetes mellitus without complications: Secondary | ICD-10-CM | POA: Diagnosis not present

## 2016-02-27 DIAGNOSIS — Z125 Encounter for screening for malignant neoplasm of prostate: Secondary | ICD-10-CM | POA: Diagnosis not present

## 2016-03-01 ENCOUNTER — Other Ambulatory Visit: Payer: Self-pay | Admitting: Physician Assistant

## 2016-03-01 DIAGNOSIS — E041 Nontoxic single thyroid nodule: Secondary | ICD-10-CM

## 2016-03-04 DIAGNOSIS — Z96652 Presence of left artificial knee joint: Secondary | ICD-10-CM | POA: Diagnosis not present

## 2016-03-08 DIAGNOSIS — J449 Chronic obstructive pulmonary disease, unspecified: Secondary | ICD-10-CM | POA: Diagnosis not present

## 2016-03-08 DIAGNOSIS — Z96652 Presence of left artificial knee joint: Secondary | ICD-10-CM | POA: Diagnosis not present

## 2016-03-11 ENCOUNTER — Other Ambulatory Visit (HOSPITAL_COMMUNITY)
Admission: RE | Admit: 2016-03-11 | Discharge: 2016-03-11 | Disposition: A | Discharge status: Home / Self Care | Payer: Medicare HMO | Source: Ambulatory Visit | Attending: Radiology | Admitting: Radiology

## 2016-03-11 ENCOUNTER — Ambulatory Visit
Admission: RE | Admit: 2016-03-11 | Discharge: 2016-03-11 | Disposition: A | Discharge status: Home / Self Care | Payer: Medicare HMO | Source: Ambulatory Visit | Attending: Physician Assistant | Admitting: Physician Assistant

## 2016-03-11 DIAGNOSIS — E041 Nontoxic single thyroid nodule: Secondary | ICD-10-CM | POA: Diagnosis not present

## 2016-03-11 NOTE — Procedures (Signed)
Successful US guided FNA of right thyroid nodule No complications.  Ascencion Dike PA-C Interventional Radiology 03/11/2016 3:35 PM

## 2016-03-13 DIAGNOSIS — J189 Pneumonia, unspecified organism: Secondary | ICD-10-CM | POA: Diagnosis not present

## 2016-03-13 DIAGNOSIS — J9601 Acute respiratory failure with hypoxia: Secondary | ICD-10-CM | POA: Diagnosis not present

## 2016-03-15 DIAGNOSIS — Z96652 Presence of left artificial knee joint: Secondary | ICD-10-CM | POA: Diagnosis not present

## 2016-03-29 ENCOUNTER — Ambulatory Visit: Payer: Medicare HMO | Admitting: Cardiology

## 2016-04-01 ENCOUNTER — Ambulatory Visit (INDEPENDENT_AMBULATORY_CARE_PROVIDER_SITE_OTHER): Payer: Medicare HMO | Admitting: Orthopaedic Surgery

## 2016-04-01 ENCOUNTER — Encounter (INDEPENDENT_AMBULATORY_CARE_PROVIDER_SITE_OTHER): Payer: Self-pay | Admitting: Orthopaedic Surgery

## 2016-04-01 DIAGNOSIS — Z96652 Presence of left artificial knee joint: Secondary | ICD-10-CM

## 2016-04-01 DIAGNOSIS — R69 Illness, unspecified: Secondary | ICD-10-CM | POA: Diagnosis not present

## 2016-04-01 NOTE — Progress Notes (Signed)
Office Visit Note   Patient: Chris Washington           Date of Birth: 1951/02/02           MRN: [id number] Visit Date: 04/01/2016              Requested by: Cyndi Bender, PA-C [address] [place], [place] [address] PCP: Cyndi Bender, PA-C   Assessment & Plan: Visit Diagnoses:  1. Status post left knee replacement     Plan:  3 month TKA follow up plan  Patient now 3 months status post total knee arthroplasty. Wound is healed with no signs of complications or infection.  The patient does not complain of pain, and is back to normal daily activities. It was reinforced that prophylactic antibiotics should be taken with any procedure including but not limited to dental work or colonoscopies.  We will plan on following up at the 12 month postop visit with radiographs at that time. As always, instructions were given to call with any questions or concerns in the interim.  Follow-Up Instructions: Return in about 9 months (around 12/30/2016) for recheck left TKA.   Orders:  No orders of the defined types were placed in this encounter.  No orders of the defined types were placed in this encounter.     Procedures: No procedures performed   Clinical Data: No additional findings.   Subjective: Chief Complaint  Patient presents with  . Left Knee - Pain, Routine Post Op, Follow-up    3 month f/u for left TKA.  C/o achiness but he is improving and doing well.  Happy with outcome so far.    Review of Systems   Objective: Vital Signs: There were no vitals taken for this visit.  Physical Exam  Musculoskeletal:       Left knee: He exhibits no effusion.    Left Knee Exam   Other  Effusion: no effusion present  Comments:  Well healed surgical scar.  ROM is back to baseline.  Minimal swelling.        Specialty Comments:  No specialty comments available.  Imaging: No results found.   PMFS History: Patient Active Problem List   Diagnosis Date Noted  . Status post  left knee replacement 01/08/2016  . Total knee replacement status 01/08/2016  . Controlled type 2 diabetes mellitus with stage 3 chronic kidney disease, without long-term current use of insulin ([place]) 11/13/2015  . Essential hypertension 11/13/2015  . PVC's (premature ventricular contractions) 11/13/2015   Past Medical History:  Diagnosis Date  . Arthritis    takes gabapentin for chronic knee pain  . Basal cell carcinoma of skin of face    left  . COPD (chronic obstructive pulmonary disease) ([place])   . Diabetes mellitus without complication ([place])    Type II  . Enlarged prostate   . Hard of hearing   . History of kidney stones   . History of pneumonia   . Hypertension   . Nocturia    "controlled with medicines"    History reviewed. No pertinent family history.  Past Surgical History:  Procedure Laterality Date  . BACK SURGERY  2014   lumbar laminectomy  . BASAL CELL CARCINOMA EXCISION     left side of face  . CARDIAC CATHETERIZATION  05/14/04   normal coronaries, LV systolic function, renals, abdominal aorta (Dr. Myrtice Lauth, [facility])  . COLONOSCOPY    . DISTAL BICEPS TENDON REPAIR Left 11/14/2015   Procedure: LEFT DISTAL  BICEPS TENDON REPAIR;  Surgeon: Leandrew Koyanagi, MD;  Location: [place];  Service: Orthopedics;  Laterality: Left;  . TOTAL KNEE ARTHROPLASTY Left 01/08/2016   Procedure: LEFT TOTAL KNEE ARTHROPLASTY;  Surgeon: Leandrew Koyanagi, MD;  Location: [place];  Service: Orthopedics;  Laterality: Left;   Social History   Occupational History  . Not on file.   Social History Main Topics  . Smoking status: Former Smoker    Quit date: 05/31/2004  . Smokeless tobacco: Never Used  . Alcohol use No  . Drug use: No  . Sexual activity: Not on file

## 2016-04-02 DIAGNOSIS — I10 Essential (primary) hypertension: Secondary | ICD-10-CM | POA: Diagnosis not present

## 2016-04-02 DIAGNOSIS — J449 Chronic obstructive pulmonary disease, unspecified: Secondary | ICD-10-CM | POA: Diagnosis not present

## 2016-04-02 DIAGNOSIS — E041 Nontoxic single thyroid nodule: Secondary | ICD-10-CM | POA: Diagnosis not present

## 2016-04-02 DIAGNOSIS — Z23 Encounter for immunization: Secondary | ICD-10-CM | POA: Diagnosis not present

## 2016-04-02 DIAGNOSIS — R69 Illness, unspecified: Secondary | ICD-10-CM | POA: Diagnosis not present

## 2016-04-02 DIAGNOSIS — N289 Disorder of kidney and ureter, unspecified: Secondary | ICD-10-CM | POA: Diagnosis not present

## 2016-04-08 DIAGNOSIS — J449 Chronic obstructive pulmonary disease, unspecified: Secondary | ICD-10-CM | POA: Diagnosis not present

## 2016-04-26 ENCOUNTER — Ambulatory Visit: Payer: Medicare HMO | Admitting: Cardiology

## 2016-05-08 DIAGNOSIS — J449 Chronic obstructive pulmonary disease, unspecified: Secondary | ICD-10-CM | POA: Diagnosis not present

## 2016-06-03 ENCOUNTER — Ambulatory Visit: Payer: Medicare HMO | Admitting: Cardiology

## 2016-06-08 DIAGNOSIS — J449 Chronic obstructive pulmonary disease, unspecified: Secondary | ICD-10-CM | POA: Diagnosis not present

## 2016-06-18 ENCOUNTER — Ambulatory Visit: Payer: Medicare HMO | Admitting: Cardiology

## 2016-06-18 NOTE — Progress Notes (Deleted)
Cardiology Office Note   Date:  06/18/2016   ID:  Chris Washington, DOB 1950-09-02, MRN [id number]  PCP:  Cyndi Bender, PA-C  Cardiologist:   Minus Breeding, MD  Referring:  ***  No chief complaint on file.     History of Present Illness: Chris Washington is a 66 y.o. male who presents for ***.  He saw Dr. Marlou Porch in 2017 for preop clearance prior to bicep tendon repair.  He had cardiovascular risk factors and frequent PVCs.  He did have syncope in 2010.  In 2005 he underwent a cardiac catheterization by Dr. Melvern Banker. It was normal.  At the time that he saw Dr. Marlou Porch he had a normal echocardiogram.    Past Medical History:  Diagnosis Date  . Arthritis    takes gabapentin for chronic knee pain  . Basal cell carcinoma of skin of face    left  . COPD (chronic obstructive pulmonary disease) ([place])   . Depression   . Diabetes mellitus without complication ([facility])    Type II  . DOE (dyspnea on exertion)   . Enlarged prostate   . Hard of hearing   . History of kidney stones   . History of pneumonia   . Hypercholesteremia   . Hypertension   . Nocturia    "controlled with medicines"    Past Surgical History:  Procedure Laterality Date  . BACK SURGERY  2014   lumbar laminectomy  . BASAL CELL CARCINOMA EXCISION     left side of face  . CARDIAC CATHETERIZATION  05/14/04   normal coronaries, LV systolic function, renals, abdominal aorta ([facility]. Myrtice Lauth, [facility])  . COLONOSCOPY    . DISTAL BICEPS TENDON REPAIR Left 11/14/2015   Procedure: LEFT DISTAL BICEPS TENDON REPAIR;  Surgeon: Leandrew Koyanagi, MD;  Location: [place];  Service: Orthopedics;  Laterality: Left;  . TOTAL KNEE ARTHROPLASTY Left 01/08/2016   Procedure: LEFT TOTAL KNEE ARTHROPLASTY;  Surgeon: Leandrew Koyanagi, MD;  Location: [place];  Service: Orthopedics;  Laterality: Left;     Current Outpatient Prescriptions  Medication Sig Dispense Refill  . albuterol (PROVENTIL) (2.5 MG/3ML) 0.083% nebulizer solution Take  2.5 mg by nebulization every 4 (four) hours as needed for wheezing or shortness of breath.    Marland Kitchen aspirin EC 81 MG tablet Take 81 mg by mouth 2 (two) times daily.    . budesonide-formoterol (SYMBICORT) 160-4.5 MCG/ACT inhaler Inhale 2 puffs into the lungs 2 (two) times daily.     . cyclobenzaprine (FLEXERIL) 10 MG tablet Take 0.5-1 tablets (5-10 mg total) by mouth 2 (two) times daily as needed. 20 tablet 0  . finasteride (PROSCAR) 5 MG tablet Take 5 mg by mouth every evening.     . gabapentin (NEURONTIN) 300 MG capsule Take 300 mg by mouth 4 (four) times daily.     Marland Kitchen ibuprofen (ADVIL,MOTRIN) 800 MG tablet     . lisinopril-hydrochlorothiazide (PRINZIDE,ZESTORETIC) 20-25 MG tablet Take 1 tablet by mouth daily.    Marland Kitchen LORazepam (ATIVAN) 0.5 MG tablet Take 0.5 mg by mouth daily.    . metFORMIN (GLUCOPHAGE-XR) 500 MG 24 hr tablet Take 1,000 mg by mouth 2 (two) times daily.     . methocarbamol (ROBAXIN) 750 MG tablet Take 1 tablet (750 mg total) by mouth 2 (two) times daily as needed for muscle spasms. 60 tablet 0  . metoprolol succinate (TOPROL-XL) 25 MG 24 hr tablet Take 25 mg by mouth daily.    Marland Kitchen  naproxen (NAPROSYN) 500 MG tablet Take 1 tablet (500 mg total) by mouth 2 (two) times daily. 30 tablet 0  . ondansetron (ZOFRAN) 4 MG tablet Take 1-2 tablets (4-8 mg total) by mouth every 8 (eight) hours as needed for nausea or vomiting. (Patient not taking: Reported on 04/01/2016) 40 tablet 0  . oxyCODONE (OXY IR/ROXICODONE) 5 MG immediate release tablet Take 1-3 tablets (5-15 mg total) by mouth every 4 (four) hours as needed. (Patient not taking: Reported on 04/01/2016) 90 tablet 0  . oxyCODONE (OXYCONTIN) 10 mg 12 hr tablet Take 1 tablet (10 mg total) by mouth every 12 (twelve) hours. (Patient not taking: Reported on 04/01/2016) 10 tablet 0  . senna-docusate (SENOKOT S) 8.6-50 MG tablet Take 1 tablet by mouth at bedtime as needed. 30 tablet 1  . tamsulosin (FLOMAX) 0.4 MG CAPS Take 1 capsule (0.4 mg total) by  mouth every morning. 30 capsule 1  . traZODone (DESYREL) 100 MG tablet Take 100 mg by mouth at bedtime.     No current facility-administered medications for this visit.     Allergies:   Patient has no known allergies.    Social History:  The patient  reports that he quit smoking about 12 years ago. He has never used smokeless tobacco. He reports that he does not drink alcohol or use drugs.   Family History:  The patient's ***family history is not on file.    ROS:  Please see the history of present illness.   Otherwise, review of systems are positive for {NONE DEFAULTED:18576::"none"}.   All other systems are reviewed and negative.    PHYSICAL EXAM: VS:  There were no vitals taken for this visit. , BMI There is no height or weight on file to calculate BMI. GENERAL:  Well appearing HEENT:  Pupils equal round and reactive, fundi not visualized, oral mucosa unremarkable NECK:  No jugular venous distention, waveform within normal limits, carotid upstroke brisk and symmetric, no bruits, no thyromegaly LYMPHATICS:  No cervical, inguinal adenopathy LUNGS:  Clear to auscultation bilaterally BACK:  No CVA tenderness CHEST:  Unremarkable HEART:  PMI not displaced or sustained,S1 and S2 within normal limits, no S3, no S4, no clicks, no rubs, *** murmurs ABD:  Flat, positive bowel sounds normal in frequency in pitch, no bruits, no rebound, no guarding, no midline pulsatile mass, no hepatomegaly, no splenomegaly EXT:  2 plus pulses throughout, no edema, no cyanosis no clubbing SKIN:  No rashes no nodules NEURO:  Cranial nerves II through XII grossly intact, motor grossly intact throughout PSYCH:  Cognitively intact, oriented to person place and time    EKG:  EKG {ACTION; IS/IS [id number] ordered today. The ekg ordered today demonstrates ***   Recent Labs: 12/30/2015: ALT 21 02/07/2016: BUN 20; Creatinine, Ser 1.37; Hemoglobin 11.2; Platelets 231; Potassium 4.5; Sodium 140    Lipid  Panel No results found for: CHOL, TRIG, HDL, CHOLHDL, VLDL, LDLCALC, LDLDIRECT    Wt Readings from Last 3 Encounters:  02/07/16 180 lb (81.6 kg)  01/08/16 196 lb (88.9 kg)  12/30/15 196 lb 8 oz (89.1 kg)      Other studies Reviewed: Additional studies/ records that were reviewed today include: ***. Review of the above records demonstrates:  Please see elsewhere in the note.  ***   ASSESSMENT AND PLAN:  ***   Current medicines are reviewed at length with the patient today.  The patient {ACTIONS; HAS/DOES NOT HAVE:19233} concerns regarding medicines.  The following changes have been made:  {  PLAN; NO CHANGE:13088:s}  Labs/ tests ordered today include: *** No orders of the defined types were placed in this encounter.    Disposition:   FU with ***    Signed, Minus Breeding, MD  06/18/2016 7:01 AM    [place]

## 2016-06-21 ENCOUNTER — Encounter: Payer: Self-pay | Admitting: *Deleted

## 2016-06-21 DIAGNOSIS — L309 Dermatitis, unspecified: Secondary | ICD-10-CM | POA: Diagnosis not present

## 2016-06-21 DIAGNOSIS — Z6829 Body mass index (BMI) 29.0-29.9, adult: Secondary | ICD-10-CM | POA: Diagnosis not present

## 2016-06-21 DIAGNOSIS — N410 Acute prostatitis: Secondary | ICD-10-CM | POA: Diagnosis not present

## 2016-06-28 DIAGNOSIS — N400 Enlarged prostate without lower urinary tract symptoms: Secondary | ICD-10-CM | POA: Diagnosis not present

## 2016-06-28 DIAGNOSIS — Z6830 Body mass index (BMI) 30.0-30.9, adult: Secondary | ICD-10-CM | POA: Diagnosis not present

## 2016-06-28 DIAGNOSIS — N410 Acute prostatitis: Secondary | ICD-10-CM | POA: Diagnosis not present

## 2016-06-28 DIAGNOSIS — L723 Sebaceous cyst: Secondary | ICD-10-CM | POA: Diagnosis not present

## 2016-07-09 DIAGNOSIS — N400 Enlarged prostate without lower urinary tract symptoms: Secondary | ICD-10-CM | POA: Diagnosis not present

## 2016-07-09 DIAGNOSIS — Z6829 Body mass index (BMI) 29.0-29.9, adult: Secondary | ICD-10-CM | POA: Diagnosis not present

## 2016-07-09 DIAGNOSIS — Z79899 Other long term (current) drug therapy: Secondary | ICD-10-CM | POA: Diagnosis not present

## 2016-07-09 DIAGNOSIS — R69 Illness, unspecified: Secondary | ICD-10-CM | POA: Diagnosis not present

## 2016-07-09 DIAGNOSIS — J449 Chronic obstructive pulmonary disease, unspecified: Secondary | ICD-10-CM | POA: Diagnosis not present

## 2016-07-09 DIAGNOSIS — E119 Type 2 diabetes mellitus without complications: Secondary | ICD-10-CM | POA: Diagnosis not present

## 2016-07-09 DIAGNOSIS — E7800 Pure hypercholesterolemia, unspecified: Secondary | ICD-10-CM | POA: Diagnosis not present

## 2016-07-09 DIAGNOSIS — I10 Essential (primary) hypertension: Secondary | ICD-10-CM | POA: Diagnosis not present

## 2016-07-26 DIAGNOSIS — N289 Disorder of kidney and ureter, unspecified: Secondary | ICD-10-CM | POA: Diagnosis not present

## 2016-08-03 DIAGNOSIS — L723 Sebaceous cyst: Secondary | ICD-10-CM | POA: Diagnosis not present

## 2016-08-03 DIAGNOSIS — Z6830 Body mass index (BMI) 30.0-30.9, adult: Secondary | ICD-10-CM | POA: Diagnosis not present

## 2016-08-06 DIAGNOSIS — J449 Chronic obstructive pulmonary disease, unspecified: Secondary | ICD-10-CM | POA: Diagnosis not present

## 2016-09-06 DIAGNOSIS — J449 Chronic obstructive pulmonary disease, unspecified: Secondary | ICD-10-CM | POA: Diagnosis not present

## 2016-10-06 DIAGNOSIS — J449 Chronic obstructive pulmonary disease, unspecified: Secondary | ICD-10-CM | POA: Diagnosis not present

## 2016-11-06 DIAGNOSIS — J449 Chronic obstructive pulmonary disease, unspecified: Secondary | ICD-10-CM | POA: Diagnosis not present

## 2016-12-06 DIAGNOSIS — J449 Chronic obstructive pulmonary disease, unspecified: Secondary | ICD-10-CM | POA: Diagnosis not present

## 2016-12-30 ENCOUNTER — Ambulatory Visit (INDEPENDENT_AMBULATORY_CARE_PROVIDER_SITE_OTHER): Payer: Medicare HMO | Admitting: Orthopaedic Surgery

## 2017-01-06 DIAGNOSIS — J449 Chronic obstructive pulmonary disease, unspecified: Secondary | ICD-10-CM | POA: Diagnosis not present

## 2017-01-07 DIAGNOSIS — R69 Illness, unspecified: Secondary | ICD-10-CM | POA: Diagnosis not present

## 2017-01-07 DIAGNOSIS — M1710 Unilateral primary osteoarthritis, unspecified knee: Secondary | ICD-10-CM | POA: Diagnosis not present

## 2017-01-07 DIAGNOSIS — I10 Essential (primary) hypertension: Secondary | ICD-10-CM | POA: Diagnosis not present

## 2017-01-07 DIAGNOSIS — Z9181 History of falling: Secondary | ICD-10-CM | POA: Diagnosis not present

## 2017-01-07 DIAGNOSIS — G4700 Insomnia, unspecified: Secondary | ICD-10-CM | POA: Diagnosis not present

## 2017-01-07 DIAGNOSIS — M545 Low back pain: Secondary | ICD-10-CM | POA: Diagnosis not present

## 2017-01-07 DIAGNOSIS — E041 Nontoxic single thyroid nodule: Secondary | ICD-10-CM | POA: Diagnosis not present

## 2017-01-07 DIAGNOSIS — E7800 Pure hypercholesterolemia, unspecified: Secondary | ICD-10-CM | POA: Diagnosis not present

## 2017-01-07 DIAGNOSIS — N400 Enlarged prostate without lower urinary tract symptoms: Secondary | ICD-10-CM | POA: Diagnosis not present

## 2017-01-07 DIAGNOSIS — E119 Type 2 diabetes mellitus without complications: Secondary | ICD-10-CM | POA: Diagnosis not present

## 2017-01-17 ENCOUNTER — Ambulatory Visit (INDEPENDENT_AMBULATORY_CARE_PROVIDER_SITE_OTHER): Payer: Medicare HMO

## 2017-01-17 ENCOUNTER — Ambulatory Visit (INDEPENDENT_AMBULATORY_CARE_PROVIDER_SITE_OTHER): Payer: Medicare HMO | Admitting: Orthopaedic Surgery

## 2017-01-17 DIAGNOSIS — Z96652 Presence of left artificial knee joint: Secondary | ICD-10-CM

## 2017-01-17 NOTE — Progress Notes (Signed)
Office Visit Note   Patient: Chris Washington           Date of Birth: 05-10-1951           MRN: [id number] Visit Date: 01/17/2017              Requested by: Cyndi Bender, PA-C [address] [place], [place] [address] PCP: Cyndi Bender, PA-C   Assessment & Plan: Visit Diagnoses:  1. Status post left knee replacement     Plan: 1 year TKA follow up plan  Patient is now one year out from a left total knee arthroplasty. Patient is doing very well and very pleased with the results. Radiographs reveal a total hip arthroplasty in good position, with no evidence of subsidence, loosening, or complicating features. It was reinforced that prophylactic antibiotics should be taken with any procedure including but not limited to dental work or colonoscopies. We plan to follow them at 1 year intervals at this time with radiographs at each visit, and as always we should be notified with any questions or concerns in the interim.   Follow-Up Instructions: Return in about 1 year (around 01/17/2018).   Orders:  Orders Placed This Encounter  Procedures  . XR KNEE 3 VIEW LEFT   No orders of the defined types were placed in this encounter.     Procedures: No procedures performed   Clinical Data: No additional findings.   Subjective: Chief Complaint  Patient presents with  . Left Knee - Pain, Follow-up    Patient is one year status post left total knee replacement. He still has some occasional moderate pain. He has discomfort when kneeling. Otherwise he is happy with his outcome.    Review of Systems   Objective: Vital Signs: There were no vitals taken for this visit.  Physical Exam  Ortho Exam Left knee exam shows a fully healed surgical scar. Range of motion is excellent. No joint effusion or swelling or evidence of infection. Specialty Comments:  No specialty comments available.  Imaging: Xr Knee 3 View Left  Result Date: 01/17/2017 Stable TKA    PMFS History: Patient  Active Problem List   Diagnosis Date Noted  . Status post left knee replacement 01/08/2016  . Total knee replacement status 01/08/2016  . Controlled type 2 diabetes mellitus with stage 3 chronic kidney disease, without long-term current use of insulin ([place]) 11/13/2015  . Essential hypertension 11/13/2015  . PVC's (premature ventricular contractions) 11/13/2015   Past Medical History:  Diagnosis Date  . Arthritis    takes gabapentin for chronic knee pain  . Basal cell carcinoma of skin of face    left  . COPD (chronic obstructive pulmonary disease) ([place])   . Depression   . Diabetes mellitus without complication ([facility])    Type II  . DOE (dyspnea on exertion)   . Enlarged prostate   . Hard of hearing   . History of kidney stones   . History of pneumonia   . Hypercholesteremia   . Hypertension   . Nocturia    "controlled with medicines"    No family history on file.  Past Surgical History:  Procedure Laterality Date  . BACK SURGERY  2014   lumbar laminectomy  . BASAL CELL CARCINOMA EXCISION     left side of face  . CARDIAC CATHETERIZATION  05/14/04   normal coronaries, LV systolic function, renals, abdominal aorta (Dr. Myrtice Lauth, [facility])  . COLONOSCOPY    . DISTAL BICEPS TENDON REPAIR  Left 11/14/2015   Procedure: LEFT DISTAL BICEPS TENDON REPAIR;  Surgeon: Leandrew Koyanagi, MD;  Location: [place];  Service: Orthopedics;  Laterality: Left;  . TOTAL KNEE ARTHROPLASTY Left 01/08/2016   Procedure: LEFT TOTAL KNEE ARTHROPLASTY;  Surgeon: Leandrew Koyanagi, MD;  Location: [place];  Service: Orthopedics;  Laterality: Left;   Social History   Occupational History  . Not on file.   Social History Main Topics  . Smoking status: Former Smoker    Quit date: 05/31/2004  . Smokeless tobacco: Never Used  . Alcohol use No  . Drug use: No  . Sexual activity: Not on file

## 2017-01-28 DIAGNOSIS — E041 Nontoxic single thyroid nodule: Secondary | ICD-10-CM | POA: Diagnosis not present

## 2017-01-28 DIAGNOSIS — E042 Nontoxic multinodular goiter: Secondary | ICD-10-CM | POA: Diagnosis not present

## 2017-02-06 DIAGNOSIS — J449 Chronic obstructive pulmonary disease, unspecified: Secondary | ICD-10-CM | POA: Diagnosis not present

## 2017-03-03 DIAGNOSIS — J441 Chronic obstructive pulmonary disease with (acute) exacerbation: Secondary | ICD-10-CM | POA: Diagnosis not present

## 2017-03-03 DIAGNOSIS — E119 Type 2 diabetes mellitus without complications: Secondary | ICD-10-CM | POA: Diagnosis not present

## 2017-03-03 DIAGNOSIS — J019 Acute sinusitis, unspecified: Secondary | ICD-10-CM | POA: Diagnosis not present

## 2017-03-03 DIAGNOSIS — I10 Essential (primary) hypertension: Secondary | ICD-10-CM | POA: Diagnosis not present

## 2017-03-17 DIAGNOSIS — Z125 Encounter for screening for malignant neoplasm of prostate: Secondary | ICD-10-CM | POA: Diagnosis not present

## 2017-03-17 DIAGNOSIS — Z0000 Encounter for general adult medical examination without abnormal findings: Secondary | ICD-10-CM | POA: Diagnosis not present

## 2017-03-17 DIAGNOSIS — Z136 Encounter for screening for cardiovascular disorders: Secondary | ICD-10-CM | POA: Diagnosis not present

## 2017-03-17 DIAGNOSIS — Z1389 Encounter for screening for other disorder: Secondary | ICD-10-CM | POA: Diagnosis not present

## 2017-03-17 DIAGNOSIS — E785 Hyperlipidemia, unspecified: Secondary | ICD-10-CM | POA: Diagnosis not present

## 2017-03-17 DIAGNOSIS — Z9181 History of falling: Secondary | ICD-10-CM | POA: Diagnosis not present

## 2017-03-17 DIAGNOSIS — Z23 Encounter for immunization: Secondary | ICD-10-CM | POA: Diagnosis not present

## 2017-04-08 DIAGNOSIS — J449 Chronic obstructive pulmonary disease, unspecified: Secondary | ICD-10-CM | POA: Diagnosis not present

## 2017-05-08 DIAGNOSIS — J449 Chronic obstructive pulmonary disease, unspecified: Secondary | ICD-10-CM | POA: Diagnosis not present

## 2017-05-29 IMAGING — CR DG KNEE 1-2V PORT*L*
2 series · 2 of 2 positions shown · non-contrast
Comparison: 06/28/2014

CLINICAL DATA: Total knee replacement surgery

EXAM:
PORTABLE LEFT KNEE - 1-2 VIEW

[AP]
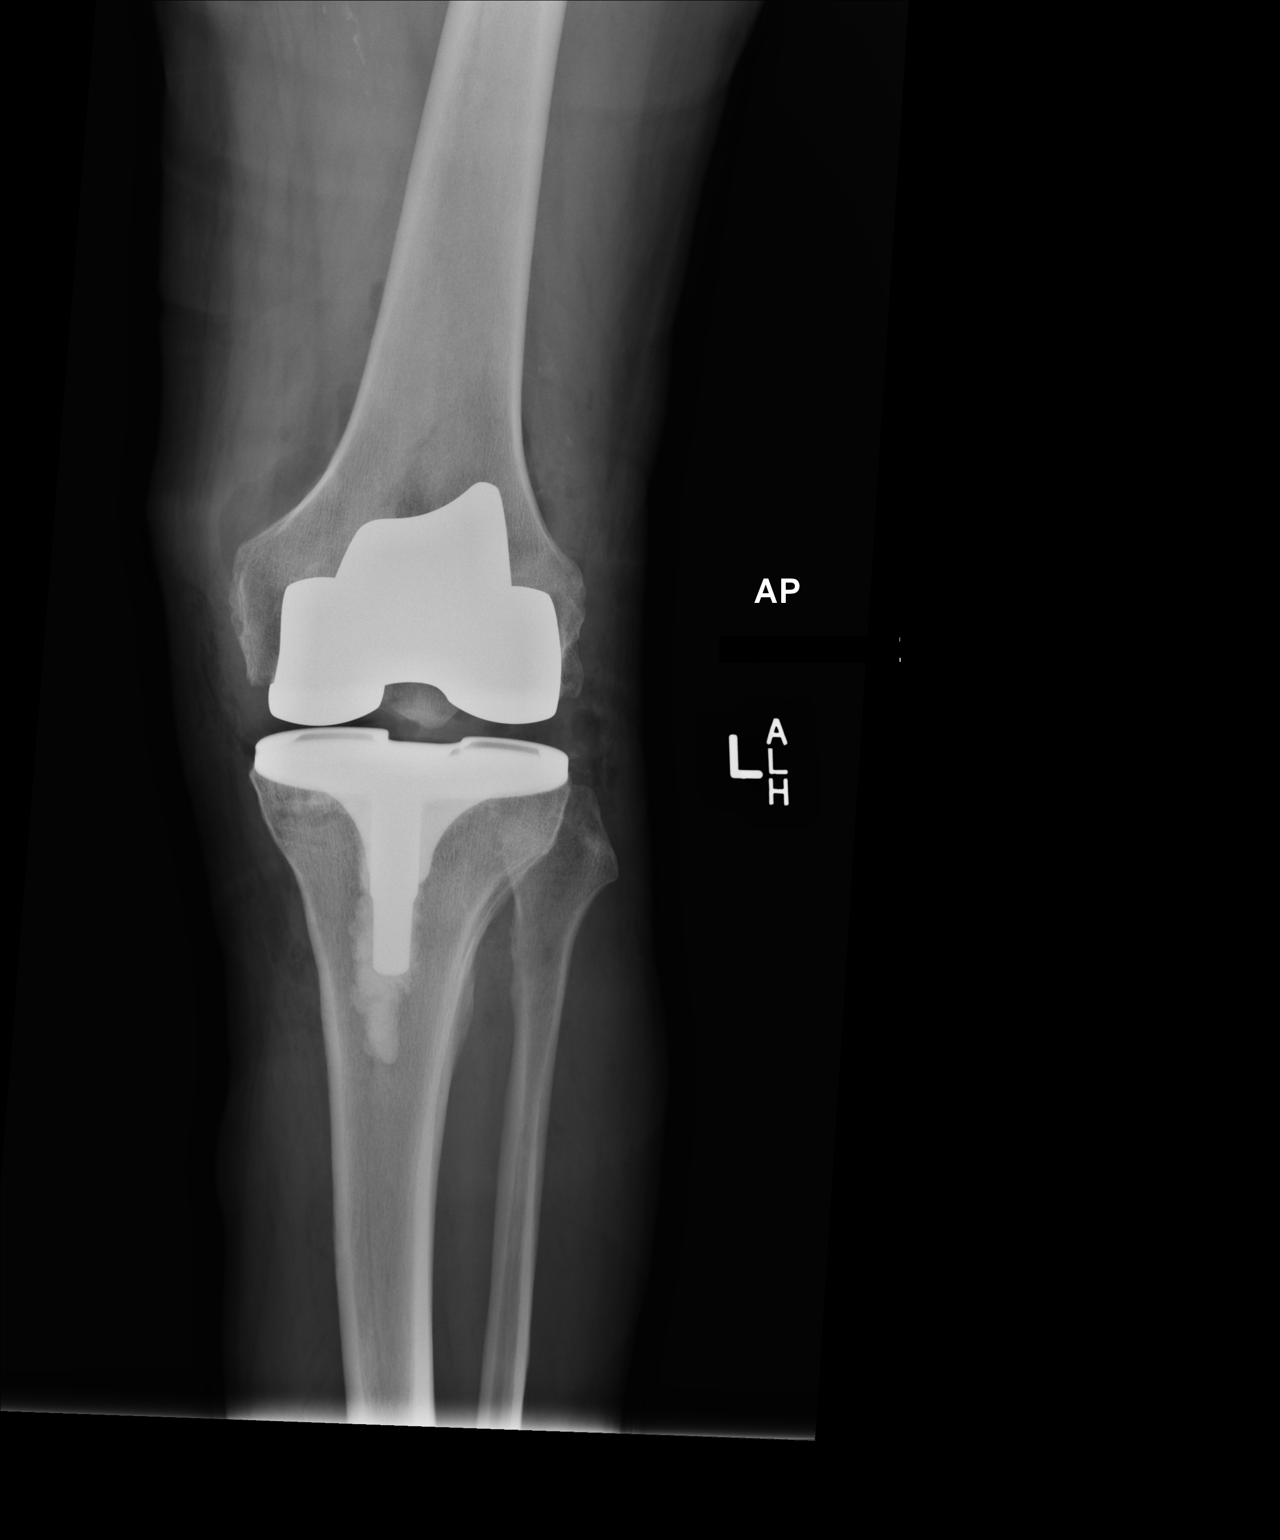

[xtable lateral]
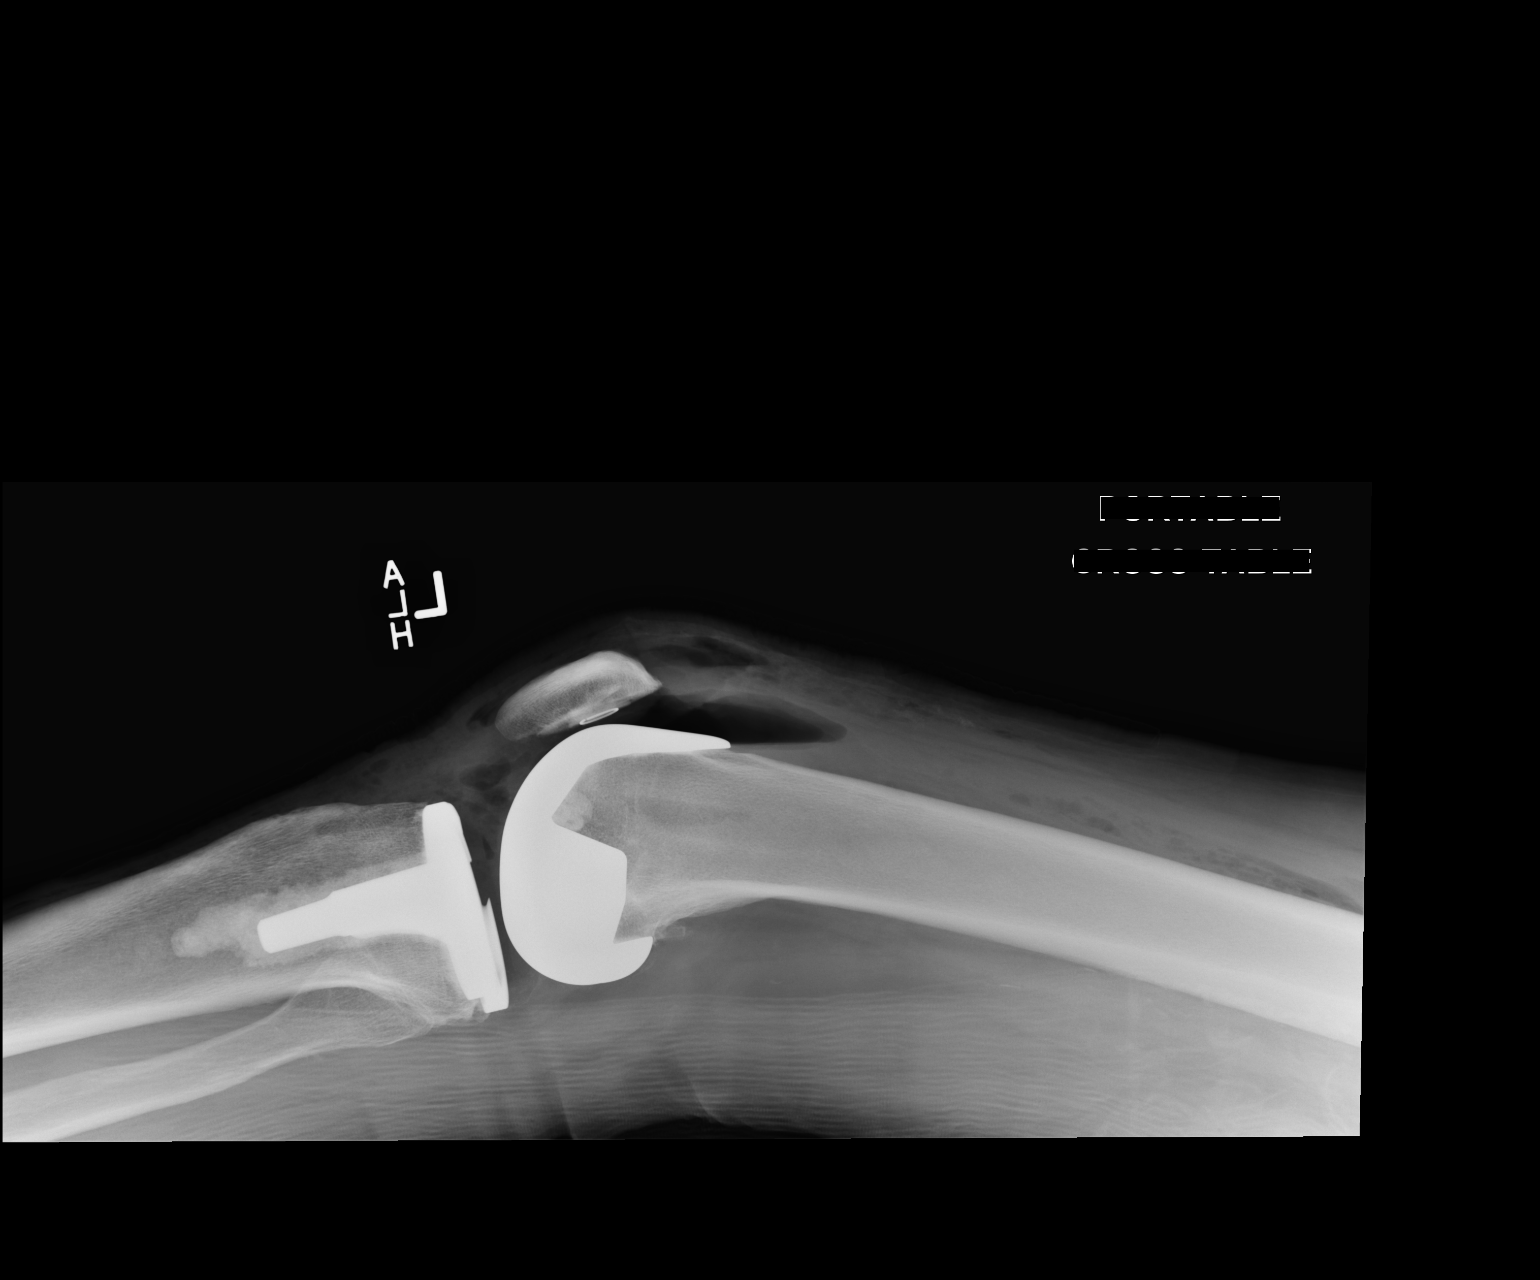

[2 of 2 positions shown; findings below may reference images not displayed]

FINDINGS: Three components of left knee arthroplasty project in expected
location. Intra-articular gas is noted. No fracture or dislocation.
IMPRESSION: 1. Left knee arthroplasty without apparent complication.

## 2017-05-31 DIAGNOSIS — Z8719 Personal history of other diseases of the digestive system: Secondary | ICD-10-CM

## 2017-05-31 HISTORY — DX: Personal history of other diseases of the digestive system: Z87.19

## 2017-07-07 DIAGNOSIS — M25551 Pain in right hip: Secondary | ICD-10-CM | POA: Diagnosis not present

## 2017-07-07 DIAGNOSIS — J441 Chronic obstructive pulmonary disease with (acute) exacerbation: Secondary | ICD-10-CM | POA: Diagnosis not present

## 2017-07-07 DIAGNOSIS — M545 Low back pain: Secondary | ICD-10-CM | POA: Diagnosis not present

## 2017-07-13 DIAGNOSIS — N400 Enlarged prostate without lower urinary tract symptoms: Secondary | ICD-10-CM | POA: Diagnosis not present

## 2017-07-13 DIAGNOSIS — I10 Essential (primary) hypertension: Secondary | ICD-10-CM | POA: Diagnosis not present

## 2017-07-13 DIAGNOSIS — J449 Chronic obstructive pulmonary disease, unspecified: Secondary | ICD-10-CM | POA: Diagnosis not present

## 2017-07-13 DIAGNOSIS — E119 Type 2 diabetes mellitus without complications: Secondary | ICD-10-CM | POA: Diagnosis not present

## 2017-07-13 DIAGNOSIS — G4700 Insomnia, unspecified: Secondary | ICD-10-CM | POA: Diagnosis not present

## 2017-07-13 DIAGNOSIS — E7800 Pure hypercholesterolemia, unspecified: Secondary | ICD-10-CM | POA: Diagnosis not present

## 2017-07-13 DIAGNOSIS — Z1331 Encounter for screening for depression: Secondary | ICD-10-CM | POA: Diagnosis not present

## 2017-07-31 IMAGING — US US THYROID BIOPSY
1 series · 13 of 14 positions shown · non-contrast
Comparison: Thyroid ultrasound performed 02/27/2016.

MEDICATIONS:
None

COMPLICATIONS:
None immediate.

INDICATION: Indeterminate right thyroid nodule

EXAM:
ULTRASOUND GUIDED FINE NEEDLE ASPIRATION OF INDETERMINATE RIGHT
THYROID NODULE
TECHNIQUE: Informed written consent was obtained from the patient after a
discussion of the risks, benefits and alternatives to treatment.
Questions regarding the procedure were encouraged and answered. A
timeout was performed prior to the initiation of the procedure.

[Series 1: us thyroid biopsy · 0.08mm/px · 14 acquisitions, 13 frames shown]
[im 1/14]
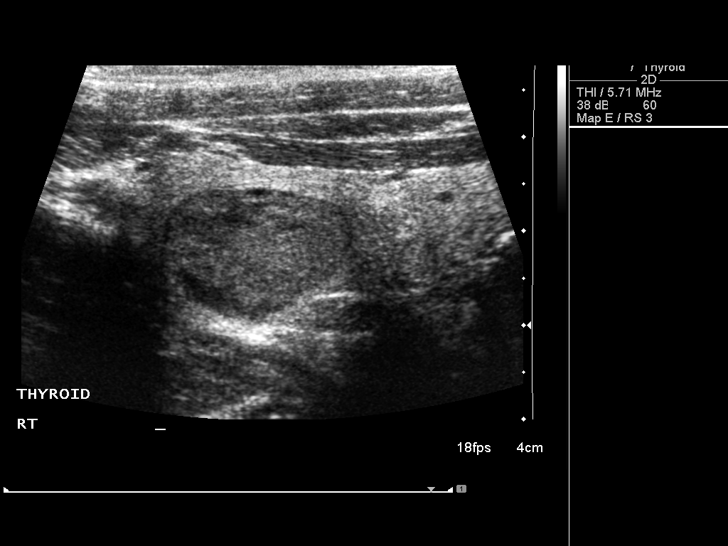
[im 2/14]
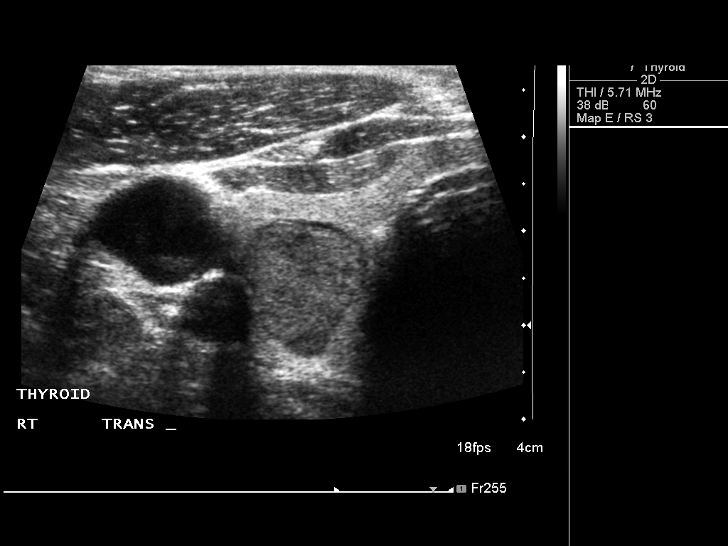
[im 3/14]
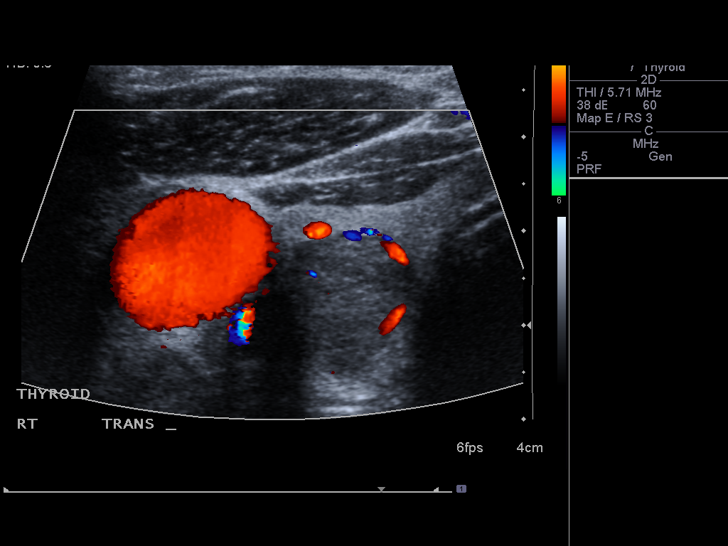
[im 4/14]
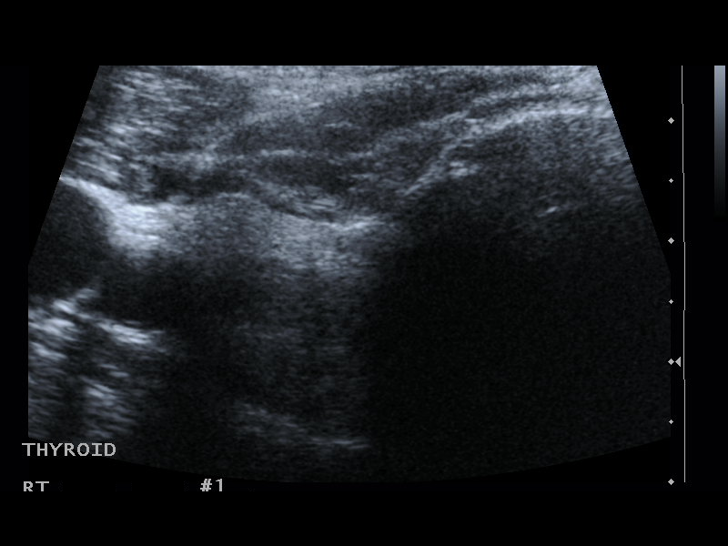
[im 5/14]
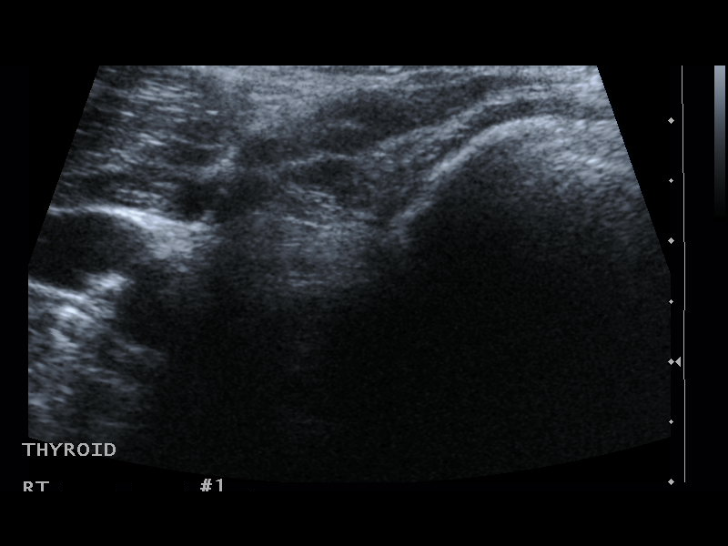
[im 6/14]
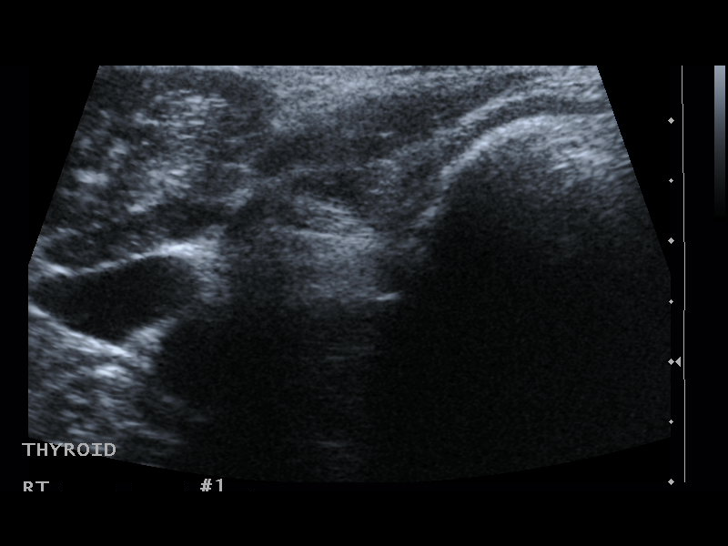
[im 8/14]
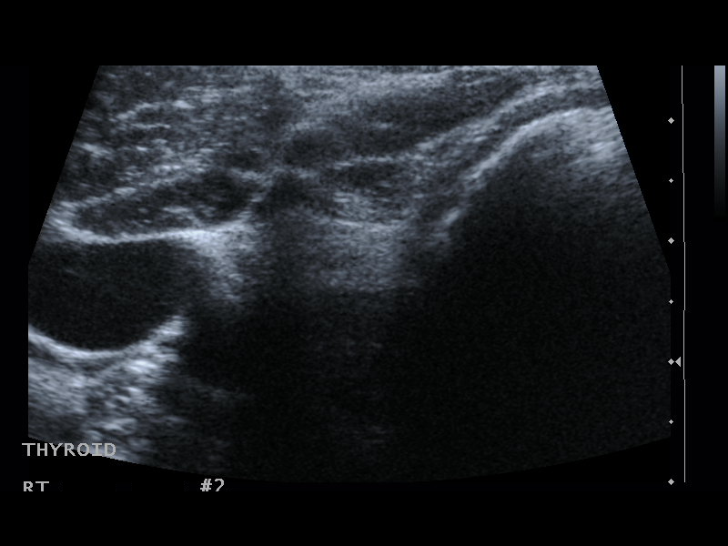
[im 9/14]
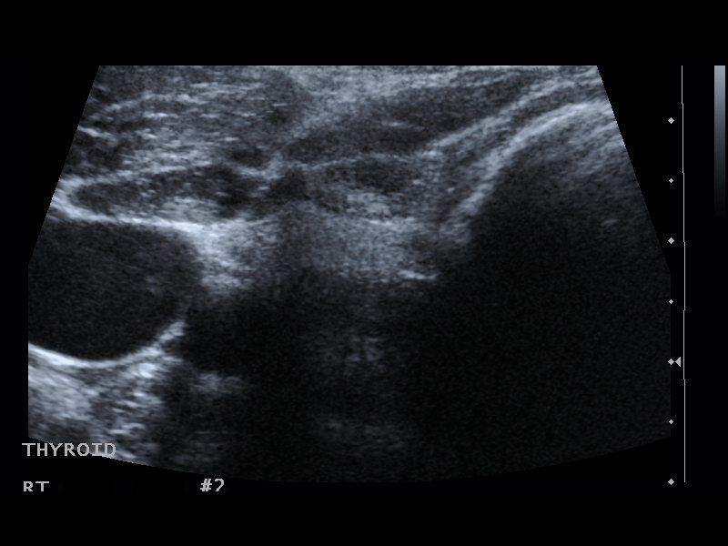
[im 10/14]
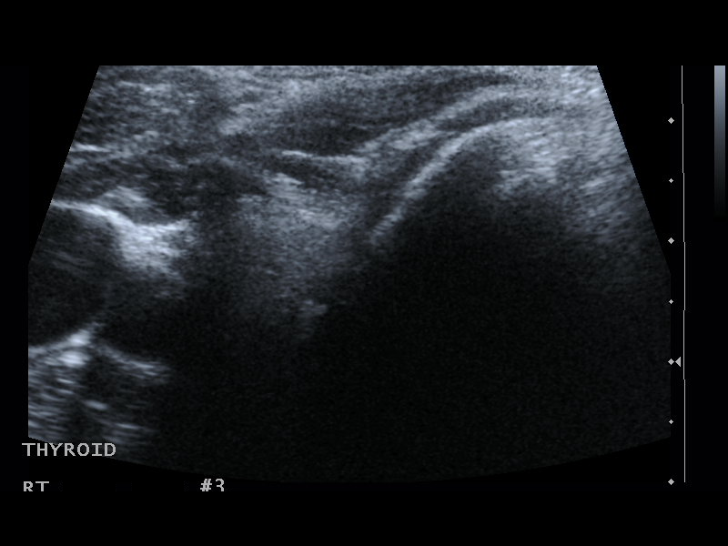
[im 11/14]
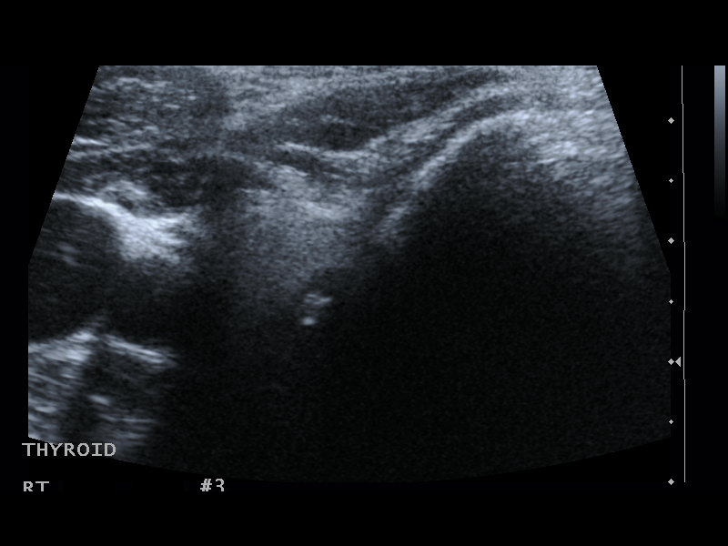
[im 12/14]
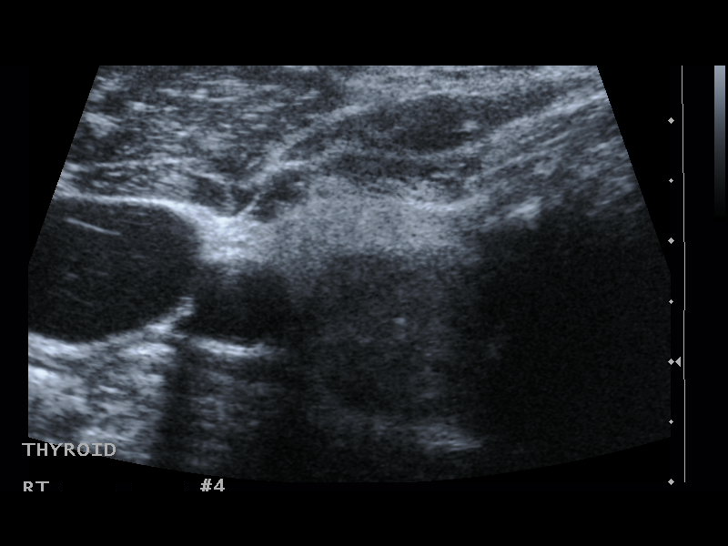
[im 13/14]
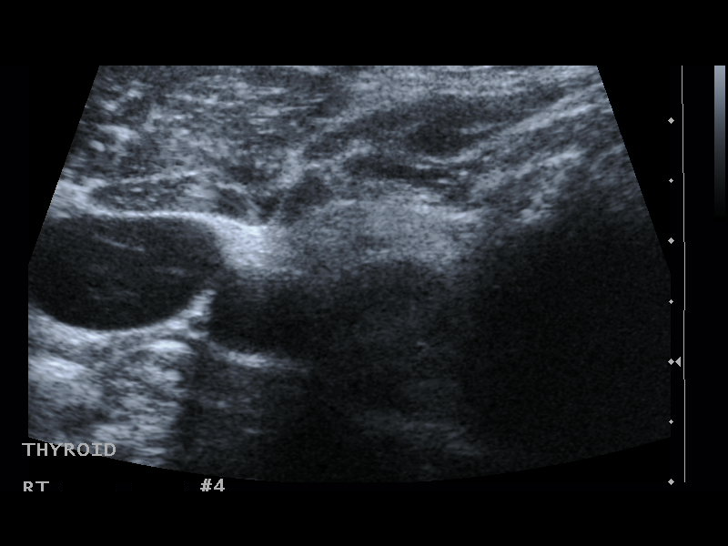
[im 14/14]
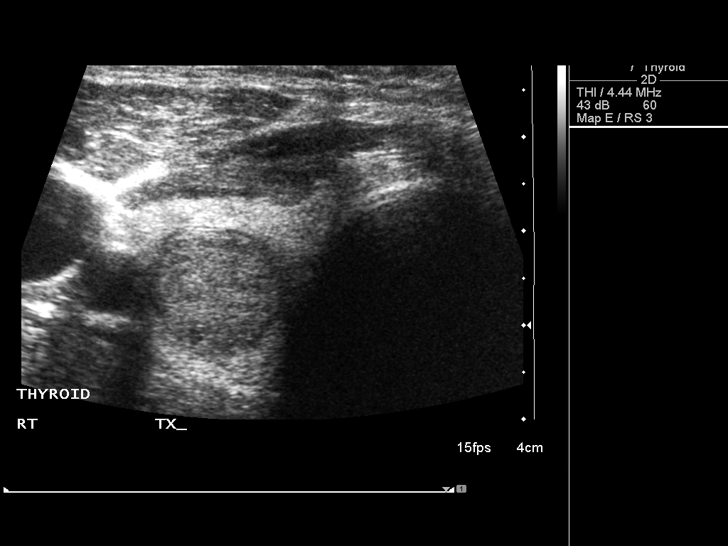

[13 of 14 positions shown; findings below may reference images not displayed]

Pre-procedural ultrasound scanning demonstrated unchanged size and
appearance of the indeterminate nodule within the right mid thyroid
lobe.

The procedure was planned. The neck was prepped in the usual sterile
fashion, and a sterile drape was applied covering the operative
field. A timeout was performed prior to the initiation of the
procedure. Local anesthesia was provided with 1% lidocaine.

Under direct ultrasound guidance, 4 FNA biopsies were performed of
the right thyroid nodule with a 25 gauge needle. Multiple ultrasound
images were saved for procedural documentation purposes. The samples
were prepared and submitted to pathology.

Limited post procedural scanning was negative for hematoma or
additional complication. Dressings were placed. The patient
tolerated the above procedures procedure well without immediate
postprocedural complication.
FINDINGS: FINDINGS
Nodule reference number based on prior diagnostic ultrasound: 1

Maximum size:  1.7 cm

Location: Right  ;  Mid

ACR TI-RADS total points: 4

ACR TI-RADS risk category:  4

Prior biopsy:  No

Reason for biopsy: meets ACR TI-RADS criteria

Ultrasound imaging confirms appropriate placement of the needles
within the thyroid nodule.
IMPRESSION: Technically successful ultrasound guided fine needle aspiration of
right thyroid nodule as described above.

## 2017-08-17 DIAGNOSIS — M6208 Separation of muscle (nontraumatic), other site: Secondary | ICD-10-CM | POA: Diagnosis not present

## 2017-08-17 DIAGNOSIS — M1710 Unilateral primary osteoarthritis, unspecified knee: Secondary | ICD-10-CM | POA: Diagnosis not present

## 2017-08-17 DIAGNOSIS — N183 Chronic kidney disease, stage 3 (moderate): Secondary | ICD-10-CM | POA: Diagnosis not present

## 2017-08-17 DIAGNOSIS — J449 Chronic obstructive pulmonary disease, unspecified: Secondary | ICD-10-CM | POA: Diagnosis not present

## 2017-08-17 DIAGNOSIS — E119 Type 2 diabetes mellitus without complications: Secondary | ICD-10-CM | POA: Diagnosis not present

## 2017-08-17 DIAGNOSIS — Z6829 Body mass index (BMI) 29.0-29.9, adult: Secondary | ICD-10-CM | POA: Diagnosis not present

## 2017-08-17 DIAGNOSIS — H2513 Age-related nuclear cataract, bilateral: Secondary | ICD-10-CM | POA: Diagnosis not present

## 2017-08-17 DIAGNOSIS — M75102 Unspecified rotator cuff tear or rupture of left shoulder, not specified as traumatic: Secondary | ICD-10-CM | POA: Diagnosis not present

## 2017-08-17 DIAGNOSIS — Z79899 Other long term (current) drug therapy: Secondary | ICD-10-CM | POA: Diagnosis not present

## 2017-08-17 DIAGNOSIS — M545 Low back pain: Secondary | ICD-10-CM | POA: Diagnosis not present

## 2017-09-21 DIAGNOSIS — I10 Essential (primary) hypertension: Secondary | ICD-10-CM | POA: Diagnosis not present

## 2017-09-21 DIAGNOSIS — Z6829 Body mass index (BMI) 29.0-29.9, adult: Secondary | ICD-10-CM | POA: Diagnosis not present

## 2017-09-21 DIAGNOSIS — J449 Chronic obstructive pulmonary disease, unspecified: Secondary | ICD-10-CM | POA: Diagnosis not present

## 2017-09-21 DIAGNOSIS — N400 Enlarged prostate without lower urinary tract symptoms: Secondary | ICD-10-CM | POA: Diagnosis not present

## 2017-09-21 DIAGNOSIS — M545 Low back pain: Secondary | ICD-10-CM | POA: Diagnosis not present

## 2017-09-21 DIAGNOSIS — G4700 Insomnia, unspecified: Secondary | ICD-10-CM | POA: Diagnosis not present

## 2017-09-21 DIAGNOSIS — M1710 Unilateral primary osteoarthritis, unspecified knee: Secondary | ICD-10-CM | POA: Diagnosis not present

## 2017-09-21 DIAGNOSIS — E119 Type 2 diabetes mellitus without complications: Secondary | ICD-10-CM | POA: Diagnosis not present

## 2017-11-01 DIAGNOSIS — H2512 Age-related nuclear cataract, left eye: Secondary | ICD-10-CM | POA: Diagnosis not present

## 2017-11-01 DIAGNOSIS — H02834 Dermatochalasis of left upper eyelid: Secondary | ICD-10-CM | POA: Diagnosis not present

## 2017-11-01 DIAGNOSIS — H2513 Age-related nuclear cataract, bilateral: Secondary | ICD-10-CM | POA: Diagnosis not present

## 2017-11-01 DIAGNOSIS — I10 Essential (primary) hypertension: Secondary | ICD-10-CM | POA: Diagnosis not present

## 2017-11-28 DIAGNOSIS — H2512 Age-related nuclear cataract, left eye: Secondary | ICD-10-CM | POA: Diagnosis not present

## 2017-11-29 DIAGNOSIS — H2511 Age-related nuclear cataract, right eye: Secondary | ICD-10-CM | POA: Diagnosis not present

## 2017-12-06 DIAGNOSIS — J449 Chronic obstructive pulmonary disease, unspecified: Secondary | ICD-10-CM | POA: Diagnosis not present

## 2017-12-08 DIAGNOSIS — N400 Enlarged prostate without lower urinary tract symptoms: Secondary | ICD-10-CM | POA: Diagnosis not present

## 2017-12-08 DIAGNOSIS — Z791 Long term (current) use of non-steroidal anti-inflammatories (NSAID): Secondary | ICD-10-CM | POA: Diagnosis not present

## 2017-12-08 DIAGNOSIS — E785 Hyperlipidemia, unspecified: Secondary | ICD-10-CM | POA: Diagnosis not present

## 2017-12-08 DIAGNOSIS — G8929 Other chronic pain: Secondary | ICD-10-CM | POA: Diagnosis not present

## 2017-12-08 DIAGNOSIS — E119 Type 2 diabetes mellitus without complications: Secondary | ICD-10-CM | POA: Diagnosis not present

## 2017-12-08 DIAGNOSIS — M1990 Unspecified osteoarthritis, unspecified site: Secondary | ICD-10-CM | POA: Diagnosis not present

## 2017-12-08 DIAGNOSIS — I252 Old myocardial infarction: Secondary | ICD-10-CM | POA: Diagnosis not present

## 2017-12-08 DIAGNOSIS — J449 Chronic obstructive pulmonary disease, unspecified: Secondary | ICD-10-CM | POA: Diagnosis not present

## 2017-12-08 DIAGNOSIS — G4700 Insomnia, unspecified: Secondary | ICD-10-CM | POA: Diagnosis not present

## 2017-12-08 DIAGNOSIS — I10 Essential (primary) hypertension: Secondary | ICD-10-CM | POA: Diagnosis not present

## 2017-12-19 DIAGNOSIS — H2511 Age-related nuclear cataract, right eye: Secondary | ICD-10-CM | POA: Diagnosis not present

## 2017-12-27 DIAGNOSIS — Z6828 Body mass index (BMI) 28.0-28.9, adult: Secondary | ICD-10-CM | POA: Diagnosis not present

## 2017-12-27 DIAGNOSIS — E119 Type 2 diabetes mellitus without complications: Secondary | ICD-10-CM | POA: Diagnosis not present

## 2017-12-27 DIAGNOSIS — Z1339 Encounter for screening examination for other mental health and behavioral disorders: Secondary | ICD-10-CM | POA: Diagnosis not present

## 2017-12-27 DIAGNOSIS — E7800 Pure hypercholesterolemia, unspecified: Secondary | ICD-10-CM | POA: Diagnosis not present

## 2017-12-27 DIAGNOSIS — M545 Low back pain: Secondary | ICD-10-CM | POA: Diagnosis not present

## 2017-12-27 DIAGNOSIS — G4700 Insomnia, unspecified: Secondary | ICD-10-CM | POA: Diagnosis not present

## 2017-12-27 DIAGNOSIS — I10 Essential (primary) hypertension: Secondary | ICD-10-CM | POA: Diagnosis not present

## 2017-12-27 DIAGNOSIS — J449 Chronic obstructive pulmonary disease, unspecified: Secondary | ICD-10-CM | POA: Diagnosis not present

## 2018-01-27 DIAGNOSIS — M2550 Pain in unspecified joint: Secondary | ICD-10-CM | POA: Diagnosis not present

## 2018-01-27 DIAGNOSIS — E7800 Pure hypercholesterolemia, unspecified: Secondary | ICD-10-CM | POA: Diagnosis not present

## 2018-01-27 DIAGNOSIS — Z79899 Other long term (current) drug therapy: Secondary | ICD-10-CM | POA: Diagnosis not present

## 2018-01-27 DIAGNOSIS — M791 Myalgia, unspecified site: Secondary | ICD-10-CM | POA: Diagnosis not present

## 2018-01-27 DIAGNOSIS — I10 Essential (primary) hypertension: Secondary | ICD-10-CM | POA: Diagnosis not present

## 2018-02-13 DIAGNOSIS — R7982 Elevated C-reactive protein (CRP): Secondary | ICD-10-CM | POA: Diagnosis not present

## 2018-02-13 DIAGNOSIS — M791 Myalgia, unspecified site: Secondary | ICD-10-CM | POA: Diagnosis not present

## 2018-02-13 DIAGNOSIS — M2550 Pain in unspecified joint: Secondary | ICD-10-CM | POA: Diagnosis not present

## 2018-03-14 DIAGNOSIS — E119 Type 2 diabetes mellitus without complications: Secondary | ICD-10-CM | POA: Diagnosis not present

## 2018-03-14 DIAGNOSIS — M2550 Pain in unspecified joint: Secondary | ICD-10-CM | POA: Diagnosis not present

## 2018-03-14 DIAGNOSIS — Z6828 Body mass index (BMI) 28.0-28.9, adult: Secondary | ICD-10-CM | POA: Diagnosis not present

## 2018-03-14 DIAGNOSIS — J441 Chronic obstructive pulmonary disease with (acute) exacerbation: Secondary | ICD-10-CM | POA: Diagnosis not present

## 2018-03-20 DIAGNOSIS — Z136 Encounter for screening for cardiovascular disorders: Secondary | ICD-10-CM | POA: Diagnosis not present

## 2018-03-20 DIAGNOSIS — Z0000 Encounter for general adult medical examination without abnormal findings: Secondary | ICD-10-CM | POA: Diagnosis not present

## 2018-03-20 DIAGNOSIS — Z139 Encounter for screening, unspecified: Secondary | ICD-10-CM | POA: Diagnosis not present

## 2018-03-20 DIAGNOSIS — Z125 Encounter for screening for malignant neoplasm of prostate: Secondary | ICD-10-CM | POA: Diagnosis not present

## 2018-03-20 DIAGNOSIS — Z23 Encounter for immunization: Secondary | ICD-10-CM | POA: Diagnosis not present

## 2018-03-20 DIAGNOSIS — E785 Hyperlipidemia, unspecified: Secondary | ICD-10-CM | POA: Diagnosis not present

## 2018-03-20 DIAGNOSIS — Z9181 History of falling: Secondary | ICD-10-CM | POA: Diagnosis not present

## 2018-03-30 DIAGNOSIS — E1121 Type 2 diabetes mellitus with diabetic nephropathy: Secondary | ICD-10-CM | POA: Diagnosis not present

## 2018-03-30 DIAGNOSIS — Z125 Encounter for screening for malignant neoplasm of prostate: Secondary | ICD-10-CM | POA: Diagnosis not present

## 2018-03-30 DIAGNOSIS — M791 Myalgia, unspecified site: Secondary | ICD-10-CM | POA: Diagnosis not present

## 2018-03-30 DIAGNOSIS — R7982 Elevated C-reactive protein (CRP): Secondary | ICD-10-CM | POA: Diagnosis not present

## 2018-03-30 DIAGNOSIS — J449 Chronic obstructive pulmonary disease, unspecified: Secondary | ICD-10-CM | POA: Diagnosis not present

## 2018-03-30 DIAGNOSIS — Z79899 Other long term (current) drug therapy: Secondary | ICD-10-CM | POA: Diagnosis not present

## 2018-03-30 DIAGNOSIS — M545 Low back pain: Secondary | ICD-10-CM | POA: Diagnosis not present

## 2018-03-30 DIAGNOSIS — M1710 Unilateral primary osteoarthritis, unspecified knee: Secondary | ICD-10-CM | POA: Diagnosis not present

## 2018-03-30 DIAGNOSIS — E7800 Pure hypercholesterolemia, unspecified: Secondary | ICD-10-CM | POA: Diagnosis not present

## 2018-03-30 DIAGNOSIS — I10 Essential (primary) hypertension: Secondary | ICD-10-CM | POA: Diagnosis not present

## 2018-04-12 DIAGNOSIS — M353 Polymyalgia rheumatica: Secondary | ICD-10-CM | POA: Diagnosis not present

## 2018-04-12 DIAGNOSIS — M150 Primary generalized (osteo)arthritis: Secondary | ICD-10-CM | POA: Diagnosis not present

## 2018-04-12 DIAGNOSIS — M2550 Pain in unspecified joint: Secondary | ICD-10-CM | POA: Diagnosis not present

## 2018-04-12 DIAGNOSIS — R5383 Other fatigue: Secondary | ICD-10-CM | POA: Diagnosis not present

## 2018-04-12 DIAGNOSIS — E663 Overweight: Secondary | ICD-10-CM | POA: Diagnosis not present

## 2018-04-12 DIAGNOSIS — Z6828 Body mass index (BMI) 28.0-28.9, adult: Secondary | ICD-10-CM | POA: Diagnosis not present

## 2018-06-20 DIAGNOSIS — E663 Overweight: Secondary | ICD-10-CM | POA: Diagnosis not present

## 2018-06-20 DIAGNOSIS — M2550 Pain in unspecified joint: Secondary | ICD-10-CM | POA: Diagnosis not present

## 2018-06-20 DIAGNOSIS — M353 Polymyalgia rheumatica: Secondary | ICD-10-CM | POA: Diagnosis not present

## 2018-06-20 DIAGNOSIS — Z7952 Long term (current) use of systemic steroids: Secondary | ICD-10-CM | POA: Diagnosis not present

## 2018-06-20 DIAGNOSIS — M150 Primary generalized (osteo)arthritis: Secondary | ICD-10-CM | POA: Diagnosis not present

## 2018-06-20 DIAGNOSIS — Z6827 Body mass index (BMI) 27.0-27.9, adult: Secondary | ICD-10-CM | POA: Diagnosis not present

## 2018-07-05 DIAGNOSIS — E1121 Type 2 diabetes mellitus with diabetic nephropathy: Secondary | ICD-10-CM | POA: Diagnosis not present

## 2018-07-05 DIAGNOSIS — N400 Enlarged prostate without lower urinary tract symptoms: Secondary | ICD-10-CM | POA: Diagnosis not present

## 2018-07-05 DIAGNOSIS — E7800 Pure hypercholesterolemia, unspecified: Secondary | ICD-10-CM | POA: Diagnosis not present

## 2018-07-05 DIAGNOSIS — M1710 Unilateral primary osteoarthritis, unspecified knee: Secondary | ICD-10-CM | POA: Diagnosis not present

## 2018-07-05 DIAGNOSIS — J449 Chronic obstructive pulmonary disease, unspecified: Secondary | ICD-10-CM | POA: Diagnosis not present

## 2018-07-05 DIAGNOSIS — M353 Polymyalgia rheumatica: Secondary | ICD-10-CM | POA: Diagnosis not present

## 2018-07-05 DIAGNOSIS — Z6828 Body mass index (BMI) 28.0-28.9, adult: Secondary | ICD-10-CM | POA: Diagnosis not present

## 2018-07-05 DIAGNOSIS — I10 Essential (primary) hypertension: Secondary | ICD-10-CM | POA: Diagnosis not present

## 2018-07-05 DIAGNOSIS — M545 Low back pain: Secondary | ICD-10-CM | POA: Diagnosis not present

## 2018-08-08 DIAGNOSIS — M353 Polymyalgia rheumatica: Secondary | ICD-10-CM | POA: Diagnosis not present

## 2018-08-08 DIAGNOSIS — M150 Primary generalized (osteo)arthritis: Secondary | ICD-10-CM | POA: Diagnosis not present

## 2018-08-08 DIAGNOSIS — E663 Overweight: Secondary | ICD-10-CM | POA: Diagnosis not present

## 2018-08-08 DIAGNOSIS — Z6829 Body mass index (BMI) 29.0-29.9, adult: Secondary | ICD-10-CM | POA: Diagnosis not present

## 2018-08-08 DIAGNOSIS — M2550 Pain in unspecified joint: Secondary | ICD-10-CM | POA: Diagnosis not present

## 2018-08-08 DIAGNOSIS — Z7952 Long term (current) use of systemic steroids: Secondary | ICD-10-CM | POA: Diagnosis not present

## 2018-10-03 DIAGNOSIS — Z6828 Body mass index (BMI) 28.0-28.9, adult: Secondary | ICD-10-CM | POA: Diagnosis not present

## 2018-10-03 DIAGNOSIS — I10 Essential (primary) hypertension: Secondary | ICD-10-CM | POA: Diagnosis not present

## 2018-10-03 DIAGNOSIS — J449 Chronic obstructive pulmonary disease, unspecified: Secondary | ICD-10-CM | POA: Diagnosis not present

## 2018-10-03 DIAGNOSIS — M1710 Unilateral primary osteoarthritis, unspecified knee: Secondary | ICD-10-CM | POA: Diagnosis not present

## 2018-10-03 DIAGNOSIS — E7800 Pure hypercholesterolemia, unspecified: Secondary | ICD-10-CM | POA: Diagnosis not present

## 2018-10-03 DIAGNOSIS — M353 Polymyalgia rheumatica: Secondary | ICD-10-CM | POA: Diagnosis not present

## 2018-10-03 DIAGNOSIS — Z79899 Other long term (current) drug therapy: Secondary | ICD-10-CM | POA: Diagnosis not present

## 2018-10-03 DIAGNOSIS — E119 Type 2 diabetes mellitus without complications: Secondary | ICD-10-CM | POA: Diagnosis not present

## 2018-10-03 DIAGNOSIS — J441 Chronic obstructive pulmonary disease with (acute) exacerbation: Secondary | ICD-10-CM | POA: Diagnosis not present

## 2018-10-03 DIAGNOSIS — M545 Low back pain: Secondary | ICD-10-CM | POA: Diagnosis not present

## 2018-10-09 DIAGNOSIS — Z7952 Long term (current) use of systemic steroids: Secondary | ICD-10-CM | POA: Diagnosis not present

## 2018-10-09 DIAGNOSIS — M353 Polymyalgia rheumatica: Secondary | ICD-10-CM | POA: Diagnosis not present

## 2018-10-09 DIAGNOSIS — M2550 Pain in unspecified joint: Secondary | ICD-10-CM | POA: Diagnosis not present

## 2018-10-09 DIAGNOSIS — M150 Primary generalized (osteo)arthritis: Secondary | ICD-10-CM | POA: Diagnosis not present

## 2018-12-25 DIAGNOSIS — M150 Primary generalized (osteo)arthritis: Secondary | ICD-10-CM | POA: Diagnosis not present

## 2018-12-25 DIAGNOSIS — Z7952 Long term (current) use of systemic steroids: Secondary | ICD-10-CM | POA: Diagnosis not present

## 2018-12-25 DIAGNOSIS — M2550 Pain in unspecified joint: Secondary | ICD-10-CM | POA: Diagnosis not present

## 2018-12-25 DIAGNOSIS — M353 Polymyalgia rheumatica: Secondary | ICD-10-CM | POA: Diagnosis not present

## 2019-01-03 DIAGNOSIS — Z79899 Other long term (current) drug therapy: Secondary | ICD-10-CM | POA: Diagnosis not present

## 2019-01-03 DIAGNOSIS — N400 Enlarged prostate without lower urinary tract symptoms: Secondary | ICD-10-CM | POA: Diagnosis not present

## 2019-01-03 DIAGNOSIS — J449 Chronic obstructive pulmonary disease, unspecified: Secondary | ICD-10-CM | POA: Diagnosis not present

## 2019-01-03 DIAGNOSIS — I10 Essential (primary) hypertension: Secondary | ICD-10-CM | POA: Diagnosis not present

## 2019-01-03 DIAGNOSIS — M545 Low back pain: Secondary | ICD-10-CM | POA: Diagnosis not present

## 2019-01-03 DIAGNOSIS — E7800 Pure hypercholesterolemia, unspecified: Secondary | ICD-10-CM | POA: Diagnosis not present

## 2019-01-03 DIAGNOSIS — E119 Type 2 diabetes mellitus without complications: Secondary | ICD-10-CM | POA: Diagnosis not present

## 2019-01-03 DIAGNOSIS — M353 Polymyalgia rheumatica: Secondary | ICD-10-CM | POA: Diagnosis not present

## 2019-01-03 DIAGNOSIS — M1710 Unilateral primary osteoarthritis, unspecified knee: Secondary | ICD-10-CM | POA: Diagnosis not present

## 2019-01-03 DIAGNOSIS — Z6827 Body mass index (BMI) 27.0-27.9, adult: Secondary | ICD-10-CM | POA: Diagnosis not present

## 2019-01-24 DIAGNOSIS — J441 Chronic obstructive pulmonary disease with (acute) exacerbation: Secondary | ICD-10-CM | POA: Diagnosis not present

## 2019-01-24 DIAGNOSIS — J019 Acute sinusitis, unspecified: Secondary | ICD-10-CM | POA: Diagnosis not present

## 2019-02-12 DIAGNOSIS — Z79899 Other long term (current) drug therapy: Secondary | ICD-10-CM | POA: Diagnosis not present

## 2019-02-21 DIAGNOSIS — Z6829 Body mass index (BMI) 29.0-29.9, adult: Secondary | ICD-10-CM | POA: Diagnosis not present

## 2019-02-21 DIAGNOSIS — L509 Urticaria, unspecified: Secondary | ICD-10-CM | POA: Diagnosis not present

## 2019-03-26 DIAGNOSIS — Z125 Encounter for screening for malignant neoplasm of prostate: Secondary | ICD-10-CM | POA: Diagnosis not present

## 2019-03-26 DIAGNOSIS — E785 Hyperlipidemia, unspecified: Secondary | ICD-10-CM | POA: Diagnosis not present

## 2019-03-26 DIAGNOSIS — Z0000 Encounter for general adult medical examination without abnormal findings: Secondary | ICD-10-CM | POA: Diagnosis not present

## 2019-03-26 DIAGNOSIS — Z9181 History of falling: Secondary | ICD-10-CM | POA: Diagnosis not present

## 2019-03-26 DIAGNOSIS — Z1331 Encounter for screening for depression: Secondary | ICD-10-CM | POA: Diagnosis not present

## 2019-03-27 DIAGNOSIS — M150 Primary generalized (osteo)arthritis: Secondary | ICD-10-CM | POA: Diagnosis not present

## 2019-03-27 DIAGNOSIS — M2550 Pain in unspecified joint: Secondary | ICD-10-CM | POA: Diagnosis not present

## 2019-03-27 DIAGNOSIS — M353 Polymyalgia rheumatica: Secondary | ICD-10-CM | POA: Diagnosis not present

## 2019-03-27 DIAGNOSIS — Z7952 Long term (current) use of systemic steroids: Secondary | ICD-10-CM | POA: Diagnosis not present

## 2019-04-05 DIAGNOSIS — I10 Essential (primary) hypertension: Secondary | ICD-10-CM | POA: Diagnosis not present

## 2019-04-05 DIAGNOSIS — Z79899 Other long term (current) drug therapy: Secondary | ICD-10-CM | POA: Diagnosis not present

## 2019-04-05 DIAGNOSIS — M353 Polymyalgia rheumatica: Secondary | ICD-10-CM | POA: Diagnosis not present

## 2019-04-05 DIAGNOSIS — J441 Chronic obstructive pulmonary disease with (acute) exacerbation: Secondary | ICD-10-CM | POA: Diagnosis not present

## 2019-04-05 DIAGNOSIS — Z23 Encounter for immunization: Secondary | ICD-10-CM | POA: Diagnosis not present

## 2019-04-05 DIAGNOSIS — Z125 Encounter for screening for malignant neoplasm of prostate: Secondary | ICD-10-CM | POA: Diagnosis not present

## 2019-04-05 DIAGNOSIS — E7800 Pure hypercholesterolemia, unspecified: Secondary | ICD-10-CM | POA: Diagnosis not present

## 2019-04-05 DIAGNOSIS — M1710 Unilateral primary osteoarthritis, unspecified knee: Secondary | ICD-10-CM | POA: Diagnosis not present

## 2019-04-05 DIAGNOSIS — J449 Chronic obstructive pulmonary disease, unspecified: Secondary | ICD-10-CM | POA: Diagnosis not present

## 2019-04-05 DIAGNOSIS — M545 Low back pain: Secondary | ICD-10-CM | POA: Diagnosis not present

## 2019-04-05 DIAGNOSIS — E1121 Type 2 diabetes mellitus with diabetic nephropathy: Secondary | ICD-10-CM | POA: Diagnosis not present

## 2019-04-23 DIAGNOSIS — N289 Disorder of kidney and ureter, unspecified: Secondary | ICD-10-CM | POA: Diagnosis not present

## 2019-06-26 DIAGNOSIS — M353 Polymyalgia rheumatica: Secondary | ICD-10-CM | POA: Diagnosis not present

## 2019-06-26 DIAGNOSIS — Z7952 Long term (current) use of systemic steroids: Secondary | ICD-10-CM | POA: Diagnosis not present

## 2019-06-26 DIAGNOSIS — M2550 Pain in unspecified joint: Secondary | ICD-10-CM | POA: Diagnosis not present

## 2019-06-26 DIAGNOSIS — M150 Primary generalized (osteo)arthritis: Secondary | ICD-10-CM | POA: Diagnosis not present

## 2019-07-09 DIAGNOSIS — N400 Enlarged prostate without lower urinary tract symptoms: Secondary | ICD-10-CM | POA: Diagnosis not present

## 2019-07-09 DIAGNOSIS — Z6828 Body mass index (BMI) 28.0-28.9, adult: Secondary | ICD-10-CM | POA: Diagnosis not present

## 2019-07-09 DIAGNOSIS — I10 Essential (primary) hypertension: Secondary | ICD-10-CM | POA: Diagnosis not present

## 2019-07-09 DIAGNOSIS — M1710 Unilateral primary osteoarthritis, unspecified knee: Secondary | ICD-10-CM | POA: Diagnosis not present

## 2019-07-09 DIAGNOSIS — M353 Polymyalgia rheumatica: Secondary | ICD-10-CM | POA: Diagnosis not present

## 2019-07-09 DIAGNOSIS — M545 Low back pain: Secondary | ICD-10-CM | POA: Diagnosis not present

## 2019-07-09 DIAGNOSIS — J449 Chronic obstructive pulmonary disease, unspecified: Secondary | ICD-10-CM | POA: Diagnosis not present

## 2019-07-09 DIAGNOSIS — Z139 Encounter for screening, unspecified: Secondary | ICD-10-CM | POA: Diagnosis not present

## 2019-07-09 DIAGNOSIS — E7800 Pure hypercholesterolemia, unspecified: Secondary | ICD-10-CM | POA: Diagnosis not present

## 2019-07-09 DIAGNOSIS — E119 Type 2 diabetes mellitus without complications: Secondary | ICD-10-CM | POA: Diagnosis not present

## 2019-08-14 DIAGNOSIS — J441 Chronic obstructive pulmonary disease with (acute) exacerbation: Secondary | ICD-10-CM | POA: Diagnosis not present

## 2019-10-04 DIAGNOSIS — M2550 Pain in unspecified joint: Secondary | ICD-10-CM | POA: Diagnosis not present

## 2019-10-04 DIAGNOSIS — M353 Polymyalgia rheumatica: Secondary | ICD-10-CM | POA: Diagnosis not present

## 2019-10-04 DIAGNOSIS — Z6825 Body mass index (BMI) 25.0-25.9, adult: Secondary | ICD-10-CM | POA: Diagnosis not present

## 2019-10-04 DIAGNOSIS — Z7952 Long term (current) use of systemic steroids: Secondary | ICD-10-CM | POA: Diagnosis not present

## 2019-10-04 DIAGNOSIS — E663 Overweight: Secondary | ICD-10-CM | POA: Diagnosis not present

## 2019-10-04 DIAGNOSIS — M150 Primary generalized (osteo)arthritis: Secondary | ICD-10-CM | POA: Diagnosis not present

## 2019-10-10 DIAGNOSIS — M545 Low back pain: Secondary | ICD-10-CM | POA: Diagnosis not present

## 2019-10-10 DIAGNOSIS — M25552 Pain in left hip: Secondary | ICD-10-CM | POA: Diagnosis not present

## 2019-10-10 DIAGNOSIS — M25551 Pain in right hip: Secondary | ICD-10-CM | POA: Diagnosis not present

## 2019-10-10 DIAGNOSIS — E7800 Pure hypercholesterolemia, unspecified: Secondary | ICD-10-CM | POA: Diagnosis not present

## 2019-10-10 DIAGNOSIS — Z79899 Other long term (current) drug therapy: Secondary | ICD-10-CM | POA: Diagnosis not present

## 2019-10-10 DIAGNOSIS — J441 Chronic obstructive pulmonary disease with (acute) exacerbation: Secondary | ICD-10-CM | POA: Diagnosis not present

## 2019-10-10 DIAGNOSIS — J449 Chronic obstructive pulmonary disease, unspecified: Secondary | ICD-10-CM | POA: Diagnosis not present

## 2019-10-10 DIAGNOSIS — M353 Polymyalgia rheumatica: Secondary | ICD-10-CM | POA: Diagnosis not present

## 2019-10-10 DIAGNOSIS — M1710 Unilateral primary osteoarthritis, unspecified knee: Secondary | ICD-10-CM | POA: Diagnosis not present

## 2019-10-10 DIAGNOSIS — I10 Essential (primary) hypertension: Secondary | ICD-10-CM | POA: Diagnosis not present

## 2019-10-10 DIAGNOSIS — E1121 Type 2 diabetes mellitus with diabetic nephropathy: Secondary | ICD-10-CM | POA: Diagnosis not present

## 2019-10-26 DIAGNOSIS — M5442 Lumbago with sciatica, left side: Secondary | ICD-10-CM | POA: Diagnosis not present

## 2019-10-26 DIAGNOSIS — M5441 Lumbago with sciatica, right side: Secondary | ICD-10-CM | POA: Diagnosis not present

## 2019-10-26 DIAGNOSIS — G8929 Other chronic pain: Secondary | ICD-10-CM | POA: Diagnosis not present

## 2019-11-01 ENCOUNTER — Other Ambulatory Visit: Payer: Self-pay | Admitting: Neurosurgery

## 2019-11-01 DIAGNOSIS — G8929 Other chronic pain: Secondary | ICD-10-CM

## 2019-11-06 DIAGNOSIS — H5203 Hypermetropia, bilateral: Secondary | ICD-10-CM | POA: Diagnosis not present

## 2019-11-19 DIAGNOSIS — E1121 Type 2 diabetes mellitus with diabetic nephropathy: Secondary | ICD-10-CM | POA: Diagnosis not present

## 2019-11-19 DIAGNOSIS — Z6827 Body mass index (BMI) 27.0-27.9, adult: Secondary | ICD-10-CM | POA: Diagnosis not present

## 2019-11-19 DIAGNOSIS — I10 Essential (primary) hypertension: Secondary | ICD-10-CM | POA: Diagnosis not present

## 2019-11-19 DIAGNOSIS — N183 Chronic kidney disease, stage 3 unspecified: Secondary | ICD-10-CM | POA: Diagnosis not present

## 2019-11-19 DIAGNOSIS — M545 Low back pain: Secondary | ICD-10-CM | POA: Diagnosis not present

## 2019-11-19 DIAGNOSIS — M1710 Unilateral primary osteoarthritis, unspecified knee: Secondary | ICD-10-CM | POA: Diagnosis not present

## 2019-11-19 DIAGNOSIS — J449 Chronic obstructive pulmonary disease, unspecified: Secondary | ICD-10-CM | POA: Diagnosis not present

## 2019-11-19 DIAGNOSIS — E7800 Pure hypercholesterolemia, unspecified: Secondary | ICD-10-CM | POA: Diagnosis not present

## 2019-11-19 DIAGNOSIS — M353 Polymyalgia rheumatica: Secondary | ICD-10-CM | POA: Diagnosis not present

## 2019-12-10 ENCOUNTER — Other Ambulatory Visit: Payer: Self-pay

## 2019-12-10 ENCOUNTER — Ambulatory Visit
Admission: RE | Admit: 2019-12-10 | Discharge: 2019-12-10 | Disposition: A | Discharge status: Home / Self Care | Payer: Medicare HMO | Source: Ambulatory Visit | Attending: Neurosurgery | Admitting: Neurosurgery

## 2019-12-10 DIAGNOSIS — D171 Benign lipomatous neoplasm of skin and subcutaneous tissue of trunk: Secondary | ICD-10-CM | POA: Diagnosis not present

## 2019-12-10 DIAGNOSIS — G8929 Other chronic pain: Secondary | ICD-10-CM

## 2019-12-10 DIAGNOSIS — M48061 Spinal stenosis, lumbar region without neurogenic claudication: Secondary | ICD-10-CM | POA: Diagnosis not present

## 2019-12-10 DIAGNOSIS — M4319 Spondylolisthesis, multiple sites in spine: Secondary | ICD-10-CM | POA: Diagnosis not present

## 2019-12-10 DIAGNOSIS — D1779 Benign lipomatous neoplasm of other sites: Secondary | ICD-10-CM | POA: Diagnosis not present

## 2019-12-10 MED ORDER — GADOBENATE DIMEGLUMINE 529 MG/ML IV SOLN
17.0000 mL | Freq: Once | INTRAVENOUS | Status: AC | PRN
  Administered 2019-12-10: 17 mL via INTRAVENOUS

## 2020-01-11 DIAGNOSIS — Z6826 Body mass index (BMI) 26.0-26.9, adult: Secondary | ICD-10-CM | POA: Diagnosis not present

## 2020-01-11 DIAGNOSIS — M48062 Spinal stenosis, lumbar region with neurogenic claudication: Secondary | ICD-10-CM | POA: Diagnosis not present

## 2020-01-11 DIAGNOSIS — I10 Essential (primary) hypertension: Secondary | ICD-10-CM | POA: Diagnosis not present

## 2020-01-11 DIAGNOSIS — M545 Low back pain: Secondary | ICD-10-CM | POA: Diagnosis not present

## 2020-01-17 DIAGNOSIS — E7800 Pure hypercholesterolemia, unspecified: Secondary | ICD-10-CM | POA: Diagnosis not present

## 2020-01-17 DIAGNOSIS — I10 Essential (primary) hypertension: Secondary | ICD-10-CM | POA: Diagnosis not present

## 2020-01-17 DIAGNOSIS — Z6827 Body mass index (BMI) 27.0-27.9, adult: Secondary | ICD-10-CM | POA: Diagnosis not present

## 2020-01-17 DIAGNOSIS — E1121 Type 2 diabetes mellitus with diabetic nephropathy: Secondary | ICD-10-CM | POA: Diagnosis not present

## 2020-01-17 DIAGNOSIS — M1710 Unilateral primary osteoarthritis, unspecified knee: Secondary | ICD-10-CM | POA: Diagnosis not present

## 2020-01-17 DIAGNOSIS — Z6826 Body mass index (BMI) 26.0-26.9, adult: Secondary | ICD-10-CM | POA: Diagnosis not present

## 2020-01-17 DIAGNOSIS — M545 Low back pain: Secondary | ICD-10-CM | POA: Diagnosis not present

## 2020-01-17 DIAGNOSIS — J449 Chronic obstructive pulmonary disease, unspecified: Secondary | ICD-10-CM | POA: Diagnosis not present

## 2020-01-17 DIAGNOSIS — M353 Polymyalgia rheumatica: Secondary | ICD-10-CM | POA: Diagnosis not present

## 2020-01-18 DIAGNOSIS — M353 Polymyalgia rheumatica: Secondary | ICD-10-CM | POA: Diagnosis not present

## 2020-01-18 DIAGNOSIS — Z6826 Body mass index (BMI) 26.0-26.9, adult: Secondary | ICD-10-CM | POA: Diagnosis not present

## 2020-01-18 DIAGNOSIS — Z7952 Long term (current) use of systemic steroids: Secondary | ICD-10-CM | POA: Diagnosis not present

## 2020-01-18 DIAGNOSIS — M2550 Pain in unspecified joint: Secondary | ICD-10-CM | POA: Diagnosis not present

## 2020-01-18 DIAGNOSIS — M150 Primary generalized (osteo)arthritis: Secondary | ICD-10-CM | POA: Diagnosis not present

## 2020-01-18 DIAGNOSIS — E663 Overweight: Secondary | ICD-10-CM | POA: Diagnosis not present

## 2020-01-28 DIAGNOSIS — K621 Rectal polyp: Secondary | ICD-10-CM | POA: Diagnosis not present

## 2020-01-28 DIAGNOSIS — D123 Benign neoplasm of transverse colon: Secondary | ICD-10-CM | POA: Diagnosis not present

## 2020-01-28 DIAGNOSIS — Z86010 Personal history of colonic polyps: Secondary | ICD-10-CM | POA: Diagnosis not present

## 2020-01-28 DIAGNOSIS — K635 Polyp of colon: Secondary | ICD-10-CM | POA: Diagnosis not present

## 2020-01-28 DIAGNOSIS — Z1211 Encounter for screening for malignant neoplasm of colon: Secondary | ICD-10-CM | POA: Diagnosis not present

## 2020-01-30 DIAGNOSIS — J449 Chronic obstructive pulmonary disease, unspecified: Secondary | ICD-10-CM | POA: Diagnosis not present

## 2020-01-30 DIAGNOSIS — M353 Polymyalgia rheumatica: Secondary | ICD-10-CM | POA: Diagnosis not present

## 2020-01-30 DIAGNOSIS — I252 Old myocardial infarction: Secondary | ICD-10-CM | POA: Diagnosis not present

## 2020-01-30 DIAGNOSIS — G4700 Insomnia, unspecified: Secondary | ICD-10-CM | POA: Diagnosis not present

## 2020-01-30 DIAGNOSIS — E119 Type 2 diabetes mellitus without complications: Secondary | ICD-10-CM | POA: Diagnosis not present

## 2020-01-30 DIAGNOSIS — G8929 Other chronic pain: Secondary | ICD-10-CM | POA: Diagnosis not present

## 2020-01-30 DIAGNOSIS — E663 Overweight: Secondary | ICD-10-CM | POA: Diagnosis not present

## 2020-01-30 DIAGNOSIS — I2510 Atherosclerotic heart disease of native coronary artery without angina pectoris: Secondary | ICD-10-CM | POA: Diagnosis not present

## 2020-01-30 DIAGNOSIS — I10 Essential (primary) hypertension: Secondary | ICD-10-CM | POA: Diagnosis not present

## 2020-01-30 DIAGNOSIS — K08409 Partial loss of teeth, unspecified cause, unspecified class: Secondary | ICD-10-CM | POA: Diagnosis not present

## 2020-02-20 DIAGNOSIS — I10 Essential (primary) hypertension: Secondary | ICD-10-CM | POA: Diagnosis not present

## 2020-02-20 DIAGNOSIS — M461 Sacroiliitis, not elsewhere classified: Secondary | ICD-10-CM | POA: Diagnosis not present

## 2020-02-20 DIAGNOSIS — Z9889 Other specified postprocedural states: Secondary | ICD-10-CM | POA: Diagnosis not present

## 2020-02-20 DIAGNOSIS — M545 Low back pain: Secondary | ICD-10-CM | POA: Diagnosis not present

## 2020-02-20 DIAGNOSIS — G894 Chronic pain syndrome: Secondary | ICD-10-CM | POA: Diagnosis not present

## 2020-02-28 DIAGNOSIS — Z20822 Contact with and (suspected) exposure to covid-19: Secondary | ICD-10-CM | POA: Diagnosis not present

## 2020-02-28 DIAGNOSIS — J441 Chronic obstructive pulmonary disease with (acute) exacerbation: Secondary | ICD-10-CM | POA: Diagnosis not present

## 2020-02-28 DIAGNOSIS — E119 Type 2 diabetes mellitus without complications: Secondary | ICD-10-CM | POA: Diagnosis not present

## 2020-03-18 DIAGNOSIS — Z6827 Body mass index (BMI) 27.0-27.9, adult: Secondary | ICD-10-CM | POA: Diagnosis not present

## 2020-03-18 DIAGNOSIS — M545 Low back pain, unspecified: Secondary | ICD-10-CM | POA: Diagnosis not present

## 2020-03-18 DIAGNOSIS — J449 Chronic obstructive pulmonary disease, unspecified: Secondary | ICD-10-CM | POA: Diagnosis not present

## 2020-03-18 DIAGNOSIS — Z125 Encounter for screening for malignant neoplasm of prostate: Secondary | ICD-10-CM | POA: Diagnosis not present

## 2020-03-18 DIAGNOSIS — E1121 Type 2 diabetes mellitus with diabetic nephropathy: Secondary | ICD-10-CM | POA: Diagnosis not present

## 2020-03-18 DIAGNOSIS — E7800 Pure hypercholesterolemia, unspecified: Secondary | ICD-10-CM | POA: Diagnosis not present

## 2020-03-18 DIAGNOSIS — I10 Essential (primary) hypertension: Secondary | ICD-10-CM | POA: Diagnosis not present

## 2020-03-18 DIAGNOSIS — Z79899 Other long term (current) drug therapy: Secondary | ICD-10-CM | POA: Diagnosis not present

## 2020-03-18 DIAGNOSIS — M1710 Unilateral primary osteoarthritis, unspecified knee: Secondary | ICD-10-CM | POA: Diagnosis not present

## 2020-03-18 DIAGNOSIS — N183 Chronic kidney disease, stage 3 unspecified: Secondary | ICD-10-CM | POA: Diagnosis not present

## 2020-03-18 DIAGNOSIS — Z23 Encounter for immunization: Secondary | ICD-10-CM | POA: Diagnosis not present

## 2020-04-09 DIAGNOSIS — E119 Type 2 diabetes mellitus without complications: Secondary | ICD-10-CM | POA: Diagnosis not present

## 2020-04-09 DIAGNOSIS — J441 Chronic obstructive pulmonary disease with (acute) exacerbation: Secondary | ICD-10-CM | POA: Diagnosis not present

## 2020-04-09 DIAGNOSIS — Z20822 Contact with and (suspected) exposure to covid-19: Secondary | ICD-10-CM | POA: Diagnosis not present

## 2020-04-22 DIAGNOSIS — M4316 Spondylolisthesis, lumbar region: Secondary | ICD-10-CM | POA: Diagnosis not present

## 2020-04-30 DIAGNOSIS — I10 Essential (primary) hypertension: Secondary | ICD-10-CM | POA: Diagnosis not present

## 2020-04-30 DIAGNOSIS — M461 Sacroiliitis, not elsewhere classified: Secondary | ICD-10-CM | POA: Diagnosis not present

## 2020-04-30 DIAGNOSIS — G894 Chronic pain syndrome: Secondary | ICD-10-CM | POA: Diagnosis not present

## 2020-04-30 DIAGNOSIS — Z9889 Other specified postprocedural states: Secondary | ICD-10-CM | POA: Diagnosis not present

## 2020-04-30 DIAGNOSIS — Z6825 Body mass index (BMI) 25.0-25.9, adult: Secondary | ICD-10-CM | POA: Diagnosis not present

## 2020-05-07 DIAGNOSIS — Z7951 Long term (current) use of inhaled steroids: Secondary | ICD-10-CM | POA: Diagnosis not present

## 2020-05-07 DIAGNOSIS — R079 Chest pain, unspecified: Secondary | ICD-10-CM | POA: Diagnosis not present

## 2020-05-07 DIAGNOSIS — Z20822 Contact with and (suspected) exposure to covid-19: Secondary | ICD-10-CM | POA: Diagnosis not present

## 2020-05-07 DIAGNOSIS — R519 Headache, unspecified: Secondary | ICD-10-CM | POA: Diagnosis not present

## 2020-05-07 DIAGNOSIS — R0600 Dyspnea, unspecified: Secondary | ICD-10-CM | POA: Diagnosis not present

## 2020-05-07 DIAGNOSIS — R0789 Other chest pain: Secondary | ICD-10-CM | POA: Diagnosis not present

## 2020-05-07 DIAGNOSIS — Z6825 Body mass index (BMI) 25.0-25.9, adult: Secondary | ICD-10-CM | POA: Diagnosis not present

## 2020-05-07 DIAGNOSIS — E1122 Type 2 diabetes mellitus with diabetic chronic kidney disease: Secondary | ICD-10-CM | POA: Diagnosis not present

## 2020-05-07 DIAGNOSIS — R0602 Shortness of breath: Secondary | ICD-10-CM | POA: Diagnosis not present

## 2020-05-07 DIAGNOSIS — N183 Chronic kidney disease, stage 3 unspecified: Secondary | ICD-10-CM | POA: Diagnosis not present

## 2020-05-07 DIAGNOSIS — I252 Old myocardial infarction: Secondary | ICD-10-CM | POA: Diagnosis not present

## 2020-05-07 DIAGNOSIS — R42 Dizziness and giddiness: Secondary | ICD-10-CM | POA: Diagnosis not present

## 2020-05-07 DIAGNOSIS — I129 Hypertensive chronic kidney disease with stage 1 through stage 4 chronic kidney disease, or unspecified chronic kidney disease: Secondary | ICD-10-CM | POA: Diagnosis not present

## 2020-05-07 DIAGNOSIS — J441 Chronic obstructive pulmonary disease with (acute) exacerbation: Secondary | ICD-10-CM | POA: Diagnosis not present

## 2020-05-07 DIAGNOSIS — Z96652 Presence of left artificial knee joint: Secondary | ICD-10-CM | POA: Diagnosis not present

## 2020-05-20 DIAGNOSIS — J449 Chronic obstructive pulmonary disease, unspecified: Secondary | ICD-10-CM | POA: Diagnosis not present

## 2020-05-20 DIAGNOSIS — R911 Solitary pulmonary nodule: Secondary | ICD-10-CM | POA: Diagnosis not present

## 2020-05-20 DIAGNOSIS — E1121 Type 2 diabetes mellitus with diabetic nephropathy: Secondary | ICD-10-CM | POA: Diagnosis not present

## 2020-05-20 DIAGNOSIS — E7800 Pure hypercholesterolemia, unspecified: Secondary | ICD-10-CM | POA: Diagnosis not present

## 2020-05-20 DIAGNOSIS — M545 Low back pain, unspecified: Secondary | ICD-10-CM | POA: Diagnosis not present

## 2020-05-20 DIAGNOSIS — I10 Essential (primary) hypertension: Secondary | ICD-10-CM | POA: Diagnosis not present

## 2020-05-20 DIAGNOSIS — M1710 Unilateral primary osteoarthritis, unspecified knee: Secondary | ICD-10-CM | POA: Diagnosis not present

## 2020-05-20 DIAGNOSIS — Z6827 Body mass index (BMI) 27.0-27.9, adult: Secondary | ICD-10-CM | POA: Diagnosis not present

## 2020-06-03 DIAGNOSIS — M461 Sacroiliitis, not elsewhere classified: Secondary | ICD-10-CM | POA: Diagnosis not present

## 2020-06-24 DIAGNOSIS — J441 Chronic obstructive pulmonary disease with (acute) exacerbation: Secondary | ICD-10-CM | POA: Diagnosis not present

## 2020-06-24 DIAGNOSIS — Z20822 Contact with and (suspected) exposure to covid-19: Secondary | ICD-10-CM | POA: Diagnosis not present

## 2020-06-24 DIAGNOSIS — E119 Type 2 diabetes mellitus without complications: Secondary | ICD-10-CM | POA: Diagnosis not present

## 2020-06-30 DIAGNOSIS — Z9889 Other specified postprocedural states: Secondary | ICD-10-CM | POA: Diagnosis not present

## 2020-06-30 DIAGNOSIS — M461 Sacroiliitis, not elsewhere classified: Secondary | ICD-10-CM | POA: Diagnosis not present

## 2020-07-07 DIAGNOSIS — I252 Old myocardial infarction: Secondary | ICD-10-CM | POA: Diagnosis not present

## 2020-07-07 DIAGNOSIS — E785 Hyperlipidemia, unspecified: Secondary | ICD-10-CM | POA: Diagnosis not present

## 2020-07-07 DIAGNOSIS — J449 Chronic obstructive pulmonary disease, unspecified: Secondary | ICD-10-CM | POA: Diagnosis not present

## 2020-07-07 DIAGNOSIS — G4700 Insomnia, unspecified: Secondary | ICD-10-CM | POA: Diagnosis not present

## 2020-07-07 DIAGNOSIS — E119 Type 2 diabetes mellitus without complications: Secondary | ICD-10-CM | POA: Diagnosis not present

## 2020-07-07 DIAGNOSIS — Z0000 Encounter for general adult medical examination without abnormal findings: Secondary | ICD-10-CM | POA: Diagnosis not present

## 2020-07-07 DIAGNOSIS — N400 Enlarged prostate without lower urinary tract symptoms: Secondary | ICD-10-CM | POA: Diagnosis not present

## 2020-07-07 DIAGNOSIS — Z9181 History of falling: Secondary | ICD-10-CM | POA: Diagnosis not present

## 2020-07-07 DIAGNOSIS — Z1331 Encounter for screening for depression: Secondary | ICD-10-CM | POA: Diagnosis not present

## 2020-07-07 DIAGNOSIS — G8929 Other chronic pain: Secondary | ICD-10-CM | POA: Diagnosis not present

## 2020-07-07 DIAGNOSIS — I10 Essential (primary) hypertension: Secondary | ICD-10-CM | POA: Diagnosis not present

## 2020-07-07 DIAGNOSIS — I2510 Atherosclerotic heart disease of native coronary artery without angina pectoris: Secondary | ICD-10-CM | POA: Diagnosis not present

## 2020-07-07 DIAGNOSIS — M1990 Unspecified osteoarthritis, unspecified site: Secondary | ICD-10-CM | POA: Diagnosis not present

## 2020-07-07 DIAGNOSIS — E663 Overweight: Secondary | ICD-10-CM | POA: Diagnosis not present

## 2020-07-28 DIAGNOSIS — J449 Chronic obstructive pulmonary disease, unspecified: Secondary | ICD-10-CM | POA: Diagnosis not present

## 2020-07-28 DIAGNOSIS — L509 Urticaria, unspecified: Secondary | ICD-10-CM | POA: Diagnosis not present

## 2020-07-28 DIAGNOSIS — E7800 Pure hypercholesterolemia, unspecified: Secondary | ICD-10-CM | POA: Diagnosis not present

## 2020-07-28 DIAGNOSIS — Z139 Encounter for screening, unspecified: Secondary | ICD-10-CM | POA: Diagnosis not present

## 2020-07-28 DIAGNOSIS — I10 Essential (primary) hypertension: Secondary | ICD-10-CM | POA: Diagnosis not present

## 2020-07-28 DIAGNOSIS — M545 Low back pain, unspecified: Secondary | ICD-10-CM | POA: Diagnosis not present

## 2020-07-28 DIAGNOSIS — E1121 Type 2 diabetes mellitus with diabetic nephropathy: Secondary | ICD-10-CM | POA: Diagnosis not present

## 2020-07-28 DIAGNOSIS — N183 Chronic kidney disease, stage 3 unspecified: Secondary | ICD-10-CM | POA: Diagnosis not present

## 2020-07-28 DIAGNOSIS — M1710 Unilateral primary osteoarthritis, unspecified knee: Secondary | ICD-10-CM | POA: Diagnosis not present

## 2020-07-28 DIAGNOSIS — R911 Solitary pulmonary nodule: Secondary | ICD-10-CM | POA: Diagnosis not present

## 2020-08-18 DIAGNOSIS — K449 Diaphragmatic hernia without obstruction or gangrene: Secondary | ICD-10-CM | POA: Diagnosis not present

## 2020-08-18 DIAGNOSIS — I700 Atherosclerosis of aorta: Secondary | ICD-10-CM | POA: Diagnosis not present

## 2020-08-18 DIAGNOSIS — J9811 Atelectasis: Secondary | ICD-10-CM | POA: Diagnosis not present

## 2020-08-18 DIAGNOSIS — R911 Solitary pulmonary nodule: Secondary | ICD-10-CM | POA: Diagnosis not present

## 2020-08-18 DIAGNOSIS — J439 Emphysema, unspecified: Secondary | ICD-10-CM | POA: Diagnosis not present

## 2020-08-18 DIAGNOSIS — J9809 Other diseases of bronchus, not elsewhere classified: Secondary | ICD-10-CM | POA: Diagnosis not present

## 2020-08-18 DIAGNOSIS — Q408 Other specified congenital malformations of upper alimentary tract: Secondary | ICD-10-CM | POA: Diagnosis not present

## 2020-08-28 DIAGNOSIS — E1121 Type 2 diabetes mellitus with diabetic nephropathy: Secondary | ICD-10-CM | POA: Diagnosis not present

## 2020-08-28 DIAGNOSIS — Z6827 Body mass index (BMI) 27.0-27.9, adult: Secondary | ICD-10-CM | POA: Diagnosis not present

## 2020-08-28 DIAGNOSIS — M1710 Unilateral primary osteoarthritis, unspecified knee: Secondary | ICD-10-CM | POA: Diagnosis not present

## 2020-08-28 DIAGNOSIS — L509 Urticaria, unspecified: Secondary | ICD-10-CM | POA: Diagnosis not present

## 2020-09-02 DIAGNOSIS — M461 Sacroiliitis, not elsewhere classified: Secondary | ICD-10-CM | POA: Diagnosis not present

## 2020-09-12 DIAGNOSIS — J440 Chronic obstructive pulmonary disease with acute lower respiratory infection: Secondary | ICD-10-CM | POA: Diagnosis not present

## 2020-09-12 DIAGNOSIS — Z87891 Personal history of nicotine dependence: Secondary | ICD-10-CM | POA: Diagnosis not present

## 2020-09-12 DIAGNOSIS — R1110 Vomiting, unspecified: Secondary | ICD-10-CM | POA: Diagnosis not present

## 2020-09-12 DIAGNOSIS — Z79899 Other long term (current) drug therapy: Secondary | ICD-10-CM | POA: Diagnosis not present

## 2020-09-12 DIAGNOSIS — G8929 Other chronic pain: Secondary | ICD-10-CM | POA: Diagnosis not present

## 2020-09-12 DIAGNOSIS — R197 Diarrhea, unspecified: Secondary | ICD-10-CM | POA: Diagnosis not present

## 2020-09-12 DIAGNOSIS — Z743 Need for continuous supervision: Secondary | ICD-10-CM | POA: Diagnosis not present

## 2020-09-12 DIAGNOSIS — Z7984 Long term (current) use of oral hypoglycemic drugs: Secondary | ICD-10-CM | POA: Diagnosis not present

## 2020-09-12 DIAGNOSIS — E860 Dehydration: Secondary | ICD-10-CM | POA: Diagnosis not present

## 2020-09-12 DIAGNOSIS — I491 Atrial premature depolarization: Secondary | ICD-10-CM | POA: Diagnosis not present

## 2020-09-12 DIAGNOSIS — E1122 Type 2 diabetes mellitus with diabetic chronic kidney disease: Secondary | ICD-10-CM | POA: Diagnosis not present

## 2020-09-12 DIAGNOSIS — R112 Nausea with vomiting, unspecified: Secondary | ICD-10-CM | POA: Diagnosis not present

## 2020-09-12 DIAGNOSIS — D72829 Elevated white blood cell count, unspecified: Secondary | ICD-10-CM | POA: Diagnosis not present

## 2020-09-12 DIAGNOSIS — Z20822 Contact with and (suspected) exposure to covid-19: Secondary | ICD-10-CM | POA: Diagnosis not present

## 2020-09-12 DIAGNOSIS — J189 Pneumonia, unspecified organism: Secondary | ICD-10-CM | POA: Diagnosis not present

## 2020-09-12 DIAGNOSIS — I129 Hypertensive chronic kidney disease with stage 1 through stage 4 chronic kidney disease, or unspecified chronic kidney disease: Secondary | ICD-10-CM | POA: Diagnosis not present

## 2020-09-12 DIAGNOSIS — M549 Dorsalgia, unspecified: Secondary | ICD-10-CM | POA: Diagnosis not present

## 2020-09-12 DIAGNOSIS — R1084 Generalized abdominal pain: Secondary | ICD-10-CM | POA: Diagnosis not present

## 2020-09-12 DIAGNOSIS — Z87448 Personal history of other diseases of urinary system: Secondary | ICD-10-CM | POA: Diagnosis not present

## 2020-09-12 DIAGNOSIS — R42 Dizziness and giddiness: Secondary | ICD-10-CM | POA: Diagnosis not present

## 2020-09-12 DIAGNOSIS — Z7951 Long term (current) use of inhaled steroids: Secondary | ICD-10-CM | POA: Diagnosis not present

## 2020-09-12 DIAGNOSIS — R9431 Abnormal electrocardiogram [ECG] [EKG]: Secondary | ICD-10-CM | POA: Diagnosis not present

## 2020-09-12 DIAGNOSIS — Z6825 Body mass index (BMI) 25.0-25.9, adult: Secondary | ICD-10-CM | POA: Diagnosis not present

## 2020-09-12 DIAGNOSIS — R109 Unspecified abdominal pain: Secondary | ICD-10-CM | POA: Diagnosis not present

## 2020-09-12 DIAGNOSIS — N183 Chronic kidney disease, stage 3 unspecified: Secondary | ICD-10-CM | POA: Diagnosis not present

## 2020-09-12 DIAGNOSIS — I4891 Unspecified atrial fibrillation: Secondary | ICD-10-CM | POA: Diagnosis not present

## 2020-09-12 DIAGNOSIS — R52 Pain, unspecified: Secondary | ICD-10-CM | POA: Diagnosis not present

## 2020-09-12 DIAGNOSIS — K5730 Diverticulosis of large intestine without perforation or abscess without bleeding: Secondary | ICD-10-CM | POA: Diagnosis not present

## 2020-09-12 DIAGNOSIS — I700 Atherosclerosis of aorta: Secondary | ICD-10-CM | POA: Diagnosis not present

## 2020-09-12 DIAGNOSIS — J9811 Atelectasis: Secondary | ICD-10-CM | POA: Diagnosis not present

## 2020-09-13 DIAGNOSIS — I129 Hypertensive chronic kidney disease with stage 1 through stage 4 chronic kidney disease, or unspecified chronic kidney disease: Secondary | ICD-10-CM | POA: Diagnosis not present

## 2020-09-13 DIAGNOSIS — J189 Pneumonia, unspecified organism: Secondary | ICD-10-CM | POA: Diagnosis not present

## 2020-09-13 DIAGNOSIS — G8929 Other chronic pain: Secondary | ICD-10-CM | POA: Diagnosis not present

## 2020-09-13 DIAGNOSIS — R1110 Vomiting, unspecified: Secondary | ICD-10-CM | POA: Diagnosis not present

## 2020-09-13 DIAGNOSIS — R112 Nausea with vomiting, unspecified: Secondary | ICD-10-CM | POA: Diagnosis not present

## 2020-09-13 DIAGNOSIS — M549 Dorsalgia, unspecified: Secondary | ICD-10-CM | POA: Diagnosis not present

## 2020-09-13 DIAGNOSIS — R1084 Generalized abdominal pain: Secondary | ICD-10-CM | POA: Diagnosis not present

## 2020-09-13 DIAGNOSIS — R109 Unspecified abdominal pain: Secondary | ICD-10-CM | POA: Diagnosis not present

## 2020-09-13 DIAGNOSIS — R638 Other symptoms and signs concerning food and fluid intake: Secondary | ICD-10-CM | POA: Diagnosis not present

## 2020-09-13 DIAGNOSIS — N183 Chronic kidney disease, stage 3 unspecified: Secondary | ICD-10-CM | POA: Diagnosis not present

## 2020-09-13 DIAGNOSIS — E1122 Type 2 diabetes mellitus with diabetic chronic kidney disease: Secondary | ICD-10-CM | POA: Diagnosis not present

## 2020-09-13 DIAGNOSIS — J449 Chronic obstructive pulmonary disease, unspecified: Secondary | ICD-10-CM | POA: Diagnosis not present

## 2020-09-25 DIAGNOSIS — I10 Essential (primary) hypertension: Secondary | ICD-10-CM | POA: Diagnosis not present

## 2020-09-25 DIAGNOSIS — L509 Urticaria, unspecified: Secondary | ICD-10-CM | POA: Diagnosis not present

## 2020-09-25 DIAGNOSIS — J449 Chronic obstructive pulmonary disease, unspecified: Secondary | ICD-10-CM | POA: Diagnosis not present

## 2020-09-25 DIAGNOSIS — E1121 Type 2 diabetes mellitus with diabetic nephropathy: Secondary | ICD-10-CM | POA: Diagnosis not present

## 2020-09-25 DIAGNOSIS — M545 Low back pain, unspecified: Secondary | ICD-10-CM | POA: Diagnosis not present

## 2020-09-25 DIAGNOSIS — Z79899 Other long term (current) drug therapy: Secondary | ICD-10-CM | POA: Diagnosis not present

## 2020-09-25 DIAGNOSIS — E7800 Pure hypercholesterolemia, unspecified: Secondary | ICD-10-CM | POA: Diagnosis not present

## 2020-09-25 DIAGNOSIS — Z6825 Body mass index (BMI) 25.0-25.9, adult: Secondary | ICD-10-CM | POA: Diagnosis not present

## 2020-09-25 DIAGNOSIS — M1710 Unilateral primary osteoarthritis, unspecified knee: Secondary | ICD-10-CM | POA: Diagnosis not present

## 2020-09-25 DIAGNOSIS — N183 Chronic kidney disease, stage 3 unspecified: Secondary | ICD-10-CM | POA: Diagnosis not present

## 2020-10-13 DIAGNOSIS — Z6826 Body mass index (BMI) 26.0-26.9, adult: Secondary | ICD-10-CM | POA: Diagnosis not present

## 2020-10-13 DIAGNOSIS — L509 Urticaria, unspecified: Secondary | ICD-10-CM | POA: Diagnosis not present

## 2020-10-13 DIAGNOSIS — E1121 Type 2 diabetes mellitus with diabetic nephropathy: Secondary | ICD-10-CM | POA: Diagnosis not present

## 2020-10-21 DIAGNOSIS — Z9889 Other specified postprocedural states: Secondary | ICD-10-CM | POA: Diagnosis not present

## 2020-10-21 DIAGNOSIS — M461 Sacroiliitis, not elsewhere classified: Secondary | ICD-10-CM | POA: Diagnosis not present

## 2020-10-21 DIAGNOSIS — M25561 Pain in right knee: Secondary | ICD-10-CM | POA: Diagnosis not present

## 2020-11-25 DIAGNOSIS — E1121 Type 2 diabetes mellitus with diabetic nephropathy: Secondary | ICD-10-CM | POA: Diagnosis not present

## 2020-11-25 DIAGNOSIS — M545 Low back pain, unspecified: Secondary | ICD-10-CM | POA: Diagnosis not present

## 2020-11-25 DIAGNOSIS — Z6825 Body mass index (BMI) 25.0-25.9, adult: Secondary | ICD-10-CM | POA: Diagnosis not present

## 2020-11-25 DIAGNOSIS — J449 Chronic obstructive pulmonary disease, unspecified: Secondary | ICD-10-CM | POA: Diagnosis not present

## 2020-11-25 DIAGNOSIS — I10 Essential (primary) hypertension: Secondary | ICD-10-CM | POA: Diagnosis not present

## 2020-11-25 DIAGNOSIS — M1710 Unilateral primary osteoarthritis, unspecified knee: Secondary | ICD-10-CM | POA: Diagnosis not present

## 2020-11-25 DIAGNOSIS — E7800 Pure hypercholesterolemia, unspecified: Secondary | ICD-10-CM | POA: Diagnosis not present

## 2020-11-25 DIAGNOSIS — N183 Chronic kidney disease, stage 3 unspecified: Secondary | ICD-10-CM | POA: Diagnosis not present

## 2020-12-04 DIAGNOSIS — J441 Chronic obstructive pulmonary disease with (acute) exacerbation: Secondary | ICD-10-CM | POA: Diagnosis not present

## 2020-12-04 DIAGNOSIS — Z20822 Contact with and (suspected) exposure to covid-19: Secondary | ICD-10-CM | POA: Diagnosis not present

## 2020-12-04 DIAGNOSIS — E119 Type 2 diabetes mellitus without complications: Secondary | ICD-10-CM | POA: Diagnosis not present

## 2020-12-04 DIAGNOSIS — Z6825 Body mass index (BMI) 25.0-25.9, adult: Secondary | ICD-10-CM | POA: Diagnosis not present

## 2020-12-16 ENCOUNTER — Ambulatory Visit: Payer: Medicare HMO | Admitting: Allergy

## 2021-01-06 DIAGNOSIS — Z6827 Body mass index (BMI) 27.0-27.9, adult: Secondary | ICD-10-CM | POA: Diagnosis not present

## 2021-01-06 DIAGNOSIS — M1710 Unilateral primary osteoarthritis, unspecified knee: Secondary | ICD-10-CM | POA: Diagnosis not present

## 2021-01-06 DIAGNOSIS — L509 Urticaria, unspecified: Secondary | ICD-10-CM | POA: Diagnosis not present

## 2021-01-20 DIAGNOSIS — M461 Sacroiliitis, not elsewhere classified: Secondary | ICD-10-CM | POA: Diagnosis not present

## 2021-01-22 ENCOUNTER — Other Ambulatory Visit: Payer: Self-pay

## 2021-01-22 ENCOUNTER — Encounter: Payer: Self-pay | Admitting: Orthopaedic Surgery

## 2021-01-22 ENCOUNTER — Ambulatory Visit (INDEPENDENT_AMBULATORY_CARE_PROVIDER_SITE_OTHER): Payer: Medicare HMO | Admitting: Orthopaedic Surgery

## 2021-01-22 ENCOUNTER — Ambulatory Visit (INDEPENDENT_AMBULATORY_CARE_PROVIDER_SITE_OTHER): Payer: Medicare HMO

## 2021-01-22 VITALS — Ht 68.5 in | Wt 185.2 lb

## 2021-01-22 DIAGNOSIS — Z96652 Presence of left artificial knee joint: Secondary | ICD-10-CM

## 2021-01-22 DIAGNOSIS — M1711 Unilateral primary osteoarthritis, right knee: Secondary | ICD-10-CM

## 2021-01-22 NOTE — Progress Notes (Signed)
Office Visit Note   Patient: Chris Washington           Date of Birth: 09-26-1950           MRN: [id number] Visit Date: 01/22/2021              Requested by: Cyndi Bender, PA-C [address] [place],   [address] PCP: Cyndi Bender, PA-C   Assessment & Plan: Visit Diagnoses:  1. Primary osteoarthritis of right knee   2. Status post left knee replacement     Plan: In regards to the left knee replacement he has done extremely well and very happy with this.  This does not limit him in any way.  In regards to the right knee pain he does have end-stage degenerative joint disease that has failed conservative treatments.  At this point he elects to proceed with a right total knee replacement in the near future.  He recently had an A1c of 6.6 about a week ago at his primary care doctor's office.  Risk benefits rehab recovery alternatives to surgery were again reviewed with the patient.  All questions answered.  Follow-Up Instructions: No follow-ups on file.   Orders:  Orders Placed This Encounter  Procedures   XR KNEE 3 VIEW RIGHT   No orders of the defined types were placed in this encounter.     Procedures: No procedures performed   Clinical Data: No additional findings.   Subjective: Chief Complaint  Patient presents with   Right Knee - Pain    HPI Mr. Mencias is a very pleasant 70 year old gentleman who comes in today for follow-up of his left total knee replacement as well as separate evaluation of the right painful knee.  He underwent a left total knee replacement in 2017 and he had a very good outcome and no recovery issues.  He has been very happy with this.  His right knee however has been worsening over time.  He has had the right knee aspirated before for an effusion and he is also had a cortisone injection which only gave temporary relief.  He is a very active gentleman and works on his property on a daily basis.  His right knee pain is severely limiting his ability  to perform daily activities and necessary tasks around the house.  He is having trouble sleeping as well.  Review of Systems  Constitutional: Negative.   All other systems reviewed and are negative.   Objective: Vital Signs: Ht 5' 8.5" (1.74 m)   Wt 185 lb 3.2 oz (84 kg)   BMI 27.75 kg/m   Physical Exam Vitals and nursing note reviewed.  Constitutional:      Appearance: He is well-developed.  HENT:     Head: Normocephalic and atraumatic.  Eyes:     Pupils: Pupils are equal, round, and reactive to light.  Pulmonary:     Effort: Pulmonary effort is normal.  Abdominal:     Palpations: Abdomen is soft.  Musculoskeletal:        General: Normal range of motion.     Cervical back: Neck supple.  Skin:    General: Skin is warm.  Neurological:     Mental Status: He is alert and oriented to person, place, and time.  Psychiatric:        Behavior: Behavior normal.        Thought Content: Thought content normal.        Judgment: Judgment normal.    Ortho Exam  Right knee examination shows a slight varus deformity.  Collaterals and cruciates are stable.  Range of motion 10 to 120 degrees with pain past 90 degrees of flexion.  Significant patellofemoral crepitus with range of motion.  Trace joint effusion.  No joint line tenderness.  Negative McMurray's sign.  Left knee shows a fully healed surgical scar.  He has excellent range of motion without pain.  Stable to varus valgus stress.  Specialty Comments:  No specialty comments available.  Imaging: XR KNEE 3 VIEW RIGHT  Result Date: 01/22/2021 Advanced tricompartmental DJD of the right knee with varus deformity.    PMFS History: Patient Active Problem List   Diagnosis Date Noted   Primary osteoarthritis of right knee 01/22/2021   Status post left knee replacement 01/08/2016   Total knee replacement status 01/08/2016   Controlled type 2 diabetes mellitus with stage 3 chronic kidney disease, without long-term current use of  insulin ([facility]) 11/13/2015   Essential hypertension 11/13/2015   PVC's (premature ventricular contractions) 11/13/2015   Past Medical History:  Diagnosis Date   Arthritis    takes gabapentin for chronic knee pain   Basal cell carcinoma of skin of face    left   COPD (chronic obstructive pulmonary disease) (HCC)    Depression    Diabetes mellitus without complication (HCC)    Type II   DOE (dyspnea on exertion)    Enlarged prostate    Hard of hearing    History of kidney stones    History of pneumonia    Hypercholesteremia    Hypertension    Nocturia    "controlled with medicines"    History reviewed. No pertinent family history.  Past Surgical History:  Procedure Laterality Date   BACK SURGERY  2014   lumbar laminectomy   BASAL CELL CARCINOMA EXCISION     left side of face   CARDIAC CATHETERIZATION  05/14/04   normal coronaries, LV systolic function, renals, abdominal aorta (Dr. Myrtice Lauth, [facility])   COLONOSCOPY     DISTAL BICEPS TENDON REPAIR Left 11/14/2015   Procedure: LEFT DISTAL BICEPS TENDON REPAIR;  Surgeon: Leandrew Koyanagi, MD;  Location: [facility];  Service: Orthopedics;  Laterality: Left;   TOTAL KNEE ARTHROPLASTY Left 01/08/2016   Procedure: LEFT TOTAL KNEE ARTHROPLASTY;  Surgeon: Leandrew Koyanagi, MD;  Location: [facility];  Service: Orthopedics;  Laterality: Left;   Social History   Occupational History   Not on file  Tobacco Use   Smoking status: Former    Types: Cigarettes    Quit date: 05/31/2004    Years since quitting: 16.6   Smokeless tobacco: Never  Substance and Sexual Activity   Alcohol use: No   Drug use: No   Sexual activity: Not on file

## 2021-01-27 ENCOUNTER — Other Ambulatory Visit: Payer: Self-pay

## 2021-02-17 DIAGNOSIS — M25561 Pain in right knee: Secondary | ICD-10-CM | POA: Diagnosis not present

## 2021-02-17 DIAGNOSIS — I10 Essential (primary) hypertension: Secondary | ICD-10-CM | POA: Diagnosis not present

## 2021-02-17 DIAGNOSIS — M461 Sacroiliitis, not elsewhere classified: Secondary | ICD-10-CM | POA: Diagnosis not present

## 2021-02-17 DIAGNOSIS — Z6825 Body mass index (BMI) 25.0-25.9, adult: Secondary | ICD-10-CM | POA: Diagnosis not present

## 2021-02-17 DIAGNOSIS — G894 Chronic pain syndrome: Secondary | ICD-10-CM | POA: Diagnosis not present

## 2021-03-03 DIAGNOSIS — I700 Atherosclerosis of aorta: Secondary | ICD-10-CM | POA: Diagnosis not present

## 2021-03-03 DIAGNOSIS — E7800 Pure hypercholesterolemia, unspecified: Secondary | ICD-10-CM | POA: Diagnosis not present

## 2021-03-03 DIAGNOSIS — G4730 Sleep apnea, unspecified: Secondary | ICD-10-CM | POA: Diagnosis not present

## 2021-03-03 DIAGNOSIS — Z125 Encounter for screening for malignant neoplasm of prostate: Secondary | ICD-10-CM | POA: Diagnosis not present

## 2021-03-03 DIAGNOSIS — I10 Essential (primary) hypertension: Secondary | ICD-10-CM | POA: Diagnosis not present

## 2021-03-03 DIAGNOSIS — N183 Chronic kidney disease, stage 3 unspecified: Secondary | ICD-10-CM | POA: Diagnosis not present

## 2021-03-03 DIAGNOSIS — E1121 Type 2 diabetes mellitus with diabetic nephropathy: Secondary | ICD-10-CM | POA: Diagnosis not present

## 2021-03-03 DIAGNOSIS — Z6826 Body mass index (BMI) 26.0-26.9, adult: Secondary | ICD-10-CM | POA: Diagnosis not present

## 2021-03-03 DIAGNOSIS — M179 Osteoarthritis of knee, unspecified: Secondary | ICD-10-CM | POA: Diagnosis not present

## 2021-03-03 DIAGNOSIS — J449 Chronic obstructive pulmonary disease, unspecified: Secondary | ICD-10-CM | POA: Diagnosis not present

## 2021-03-11 DIAGNOSIS — R0602 Shortness of breath: Secondary | ICD-10-CM | POA: Diagnosis not present

## 2021-03-11 DIAGNOSIS — G4733 Obstructive sleep apnea (adult) (pediatric): Secondary | ICD-10-CM | POA: Diagnosis not present

## 2021-03-12 DIAGNOSIS — R0602 Shortness of breath: Secondary | ICD-10-CM | POA: Diagnosis not present

## 2021-03-12 DIAGNOSIS — G4733 Obstructive sleep apnea (adult) (pediatric): Secondary | ICD-10-CM | POA: Diagnosis not present

## 2021-03-16 ENCOUNTER — Other Ambulatory Visit: Payer: Self-pay | Admitting: Physician Assistant

## 2021-03-16 ENCOUNTER — Telehealth: Payer: Self-pay | Admitting: Orthopaedic Surgery

## 2021-03-16 MED ORDER — ASPIRIN EC 81 MG PO TBEC: 81.0000 mg | DELAYED_RELEASE_TABLET | Freq: Two times a day (BID) | ORAL | 0 refills | Status: DC

## 2021-03-16 MED ORDER — OXYCODONE-ACETAMINOPHEN 5-325 MG PO TABS: 1.0000 | ORAL_TABLET | ORAL | 0 refills | Status: DC | PRN

## 2021-03-16 MED ORDER — ONDANSETRON HCL 4 MG PO TABS: 4.0000 mg | ORAL_TABLET | Freq: Three times a day (TID) | ORAL | 0 refills | Status: DC | PRN

## 2021-03-16 MED ORDER — DOCUSATE SODIUM 100 MG PO CAPS: 100.0000 mg | ORAL_CAPSULE | Freq: Every day | ORAL | 2 refills | Status: AC | PRN

## 2021-03-16 MED ORDER — CEPHALEXIN 500 MG PO CAPS: 500.0000 mg | ORAL_CAPSULE | Freq: Four times a day (QID) | ORAL | 0 refills | Status: DC

## 2021-03-16 MED ORDER — OXYCODONE-ACETAMINOPHEN 5-325 MG PO TABS: 1.0000 | ORAL_TABLET | Freq: Four times a day (QID) | ORAL | 0 refills | Status: DC | PRN

## 2021-03-16 MED ORDER — METHOCARBAMOL 500 MG PO TABS: 500.0000 mg | ORAL_TABLET | Freq: Two times a day (BID) | ORAL | 0 refills | Status: DC | PRN

## 2021-03-16 NOTE — Telephone Encounter (Signed)
Called patient no answer. LMOM.   Rx sent into pharm.

## 2021-03-16 NOTE — Telephone Encounter (Signed)
Sent in corrected rx  

## 2021-03-16 NOTE — Telephone Encounter (Signed)
Chris Washington from [facility] called. Patient is there to pick up oxycodone but insurance will only pay for 6 per day. Would like the RX changed to 6 per day. Call back number is [phone]

## 2021-03-18 ENCOUNTER — Telehealth: Payer: Self-pay | Admitting: Orthopaedic Surgery

## 2021-03-18 NOTE — Progress Notes (Signed)
Your procedure is scheduled on Monday, 03/23/21.  Report to [facility] Main Entrance "A" at 05:30 A.M., and check in at the Admitting office.  Call this number if you have problems the morning of surgery: [phone]  Call [phone] if you have any questions prior to your surgery date Monday-Friday 8am-4pm   Remember: Do not eat after midnight the night before your surgery  You may drink clear liquids until 04:15 AM the morning of your surgery.   Clear liquids allowed are: Water, Non-Citrus Juices (without pulp), Carbonated Beverages, Clear Tea, Black Coffee Only, and Gatorade  Please complete your PRE-SURGERY G2 that was provided to you by 04:15 AM the morning of your surgery. .  Please, if able, drink it in one setting. DO NOT SIP.    Take these medicines the morning of surgery with A SIP OF WATER: budesonide-formoterol (SYMBICORT)  oxyCODONE-acetaminophen (PERCOCET)    If needed: Inhaler and nebulizer --- Please bring all inhalers with you the day of surgery.  albuterol (VENTOLIN HFA) inhaler albuterol (PROVENTIL) nebulizer ondansetron [facility])  Follow your surgeon's instructions on when to stop Aspirin.  If no instructions were given by your surgeon then you will need to call the office to get those instructions.    As of today, STOP taking any Aleve, Naproxen, Ibuprofen, Motrin, Advil, Goody's, BC's, all herbal medications, fish oil, and all vitamins.   ** PLEASE check your blood sugar the morning of your surgery when you wake up and every 2 hours until you get to the Short Stay unit.  If your blood sugar is less than 70 mg/dL, you will need to treat for low blood sugar: Do not take insulin. Treat a low blood sugar (less than 70 mg/dL) with  cup of clear juice (cranberry or apple), 4 glucose tablets, OR glucose gel. Recheck blood sugar in 15 minutes after treatment (to make sure it is greater than 70 mg/dL). If your blood sugar is not greater than 70 mg/dL on  recheck, call [phone] for further instructions.  DO NOT TAKE ANY ORAL DIABETIC MEDICATIONS MORNING OF SURGERY  (this includes metFORMIN (GLUCOPHAGE-XR)    The Morning of Surgery  Do not wear jewelry  Do not wear lotions, powders, colognes, or deodorant  Men may shave face and neck.  Do not bring valuables to the hospital.  [facility] is not responsible for any belongings or valuables.  If you are a smoker, DO NOT Smoke 24 hours prior to surgery  If you wear a CPAP at night please bring your mask the morning of surgery   Remember that you must have someone to transport you home after your surgery, and remain with you for 24 hours if you are discharged the same day.   Please bring cases for contacts, glasses, hearing aids, dentures or bridgework because it cannot be worn into surgery.    Leave your suitcase in the car.  After surgery it may be brought to your room.  For patients admitted to the hospital, discharge time will be determined by your treatment team.  Patients discharged the day of surgery will not be allowed to drive home.    Special instructions:   [place]- Preparing For Surgery  Before surgery, you can play an important role. Because skin is not sterile, your skin needs to be as free of germs as possible. You can reduce the number of germs on your skin by washing with CHG (chlorahexidine gluconate) Soap before surgery.  CHG is an antiseptic  cleaner which kills germs and bonds with the skin to continue killing germs even after washing.    Oral Hygiene is also important to reduce your risk of infection.  Remember - BRUSH YOUR TEETH THE MORNING OF SURGERY WITH YOUR REGULAR TOOTHPASTE  Please do not use if you have an allergy to CHG or antibacterial soaps. If your skin becomes reddened/irritated stop using the CHG.  Do not shave (including legs and underarms) for at least 48 hours prior to first CHG shower. It is OK to shave your face.  Please follow these  instructions carefully.   Shower the NIGHT BEFORE SURGERY and the MORNING OF SURGERY with CHG Soap.   If you chose to wash your hair and body, wash as usual with your normal shampoo and body-wash/soap.  Rinse your hair and body thoroughly to remove the shampoo and soap.  Apply CHG directly to the skin (ONLY FROM THE NECK DOWN) and wash gently with a scrungie or a clean washcloth.   Do not use on open wounds or open sores. Avoid contact with your eyes, ears, mouth and genitals (private parts). Wash Face and genitals (private parts)  with your normal soap.   Wash thoroughly, paying special attention to the area where your surgery will be performed.  Thoroughly rinse your body with warm water from the neck down.  DO NOT shower/wash with your normal soap after using and rinsing off the CHG Soap.  Pat yourself dry with a CLEAN TOWEL.  Wear CLEAN PAJAMAS to bed the night before surgery  Place CLEAN SHEETS on your bed the night of your first shower and DO NOT SLEEP WITH PETS.  Wear comfortable clothes the morning of surgery.     Day of Surgery:  Please shower the morning of surgery with the CHG soap Do not apply any deodorants/lotions. Please wear clean clothes to the hospital/surgery center.   Remember to brush your teeth WITH YOUR REGULAR TOOTHPASTE.  NO VISITORS WILL BE ALLOWED IN PRE-OP WHERE PATIENTS ARE PREPPED FOR SURGERY.  ONLY 1 SUPPORT PERSON MAY BE PRESENT IN THE WAITING ROOM WHILE YOU ARE IN SURGERY.  IF YOU ARE TO BE ADMITTED, ONCE YOU ARE IN YOUR ROOM YOU WILL BE ALLOWED TWO (2) VISITORS. 1 (ONE) VISITOR MAY STAY OVERNIGHT BUT MUST ARRIVE TO THE ROOM BY 8pm.  Minor children may have two parents present. Special consideration for safety and communication needs will be reviewed on a case by case basis.     Please read over the following fact sheets that you were given.

## 2021-03-18 NOTE — Telephone Encounter (Signed)
Patient called advised he received a phone call on Saturday and was returning a call. Patient wanted to know if you called him?  Patient said he is having surgery 03/23/2021.  The number to contact patient is [phone]

## 2021-03-19 ENCOUNTER — Other Ambulatory Visit: Payer: Self-pay

## 2021-03-19 ENCOUNTER — Telehealth: Payer: Self-pay | Admitting: Orthopaedic Surgery

## 2021-03-19 ENCOUNTER — Encounter (HOSPITAL_COMMUNITY)
Admission: RE | Admit: 2021-03-19 | Discharge: 2021-03-19 | Disposition: A | Discharge status: Home / Self Care | Payer: Medicare HMO | Source: Ambulatory Visit | Attending: Orthopaedic Surgery | Admitting: Orthopaedic Surgery

## 2021-03-19 ENCOUNTER — Encounter (HOSPITAL_COMMUNITY): Payer: Self-pay

## 2021-03-19 VITALS — BP 117/60 | HR 88 | Temp 97.6°F | Resp 18 | Ht 69.0 in | Wt 174.3 lb

## 2021-03-19 DIAGNOSIS — Z20822 Contact with and (suspected) exposure to covid-19: Secondary | ICD-10-CM | POA: Insufficient documentation

## 2021-03-19 DIAGNOSIS — Z01818 Encounter for other preprocedural examination: Secondary | ICD-10-CM | POA: Diagnosis present

## 2021-03-19 DIAGNOSIS — E119 Type 2 diabetes mellitus without complications: Secondary | ICD-10-CM | POA: Insufficient documentation

## 2021-03-19 DIAGNOSIS — N183 Chronic kidney disease, stage 3 unspecified: Secondary | ICD-10-CM | POA: Diagnosis not present

## 2021-03-19 HISTORY — DX: Cardiac arrhythmia, unspecified: I49.9

## 2021-03-19 HISTORY — DX: Chronic kidney disease, unspecified: N18.9

## 2021-03-19 LAB — BASIC METABOLIC PANEL
Anion gap: 10 (ref 5–15)
BUN: 16 mg/dL (ref 8–23)
CO2: 24 mmol/L (ref 22–32)
Calcium: 9.4 mg/dL (ref 8.9–10.3)
Chloride: 105 mmol/L (ref 98–111)
Creatinine, Ser: 1.77 mg/dL — ABNORMAL HIGH (ref 0.61–1.24)
GFR, Estimated: 41 mL/min — ABNORMAL LOW (ref >60)
Glucose, Bld: 107 mg/dL — ABNORMAL HIGH (ref 70–99)
Potassium: 4.4 mmol/L (ref 3.5–5.1)
Sodium: 139 mmol/L (ref 135–145)

## 2021-03-19 LAB — SARS CORONAVIRUS 2 (TAT 6-24 HRS): SARS Coronavirus 2: NEGATIVE

## 2021-03-19 LAB — SURGICAL PCR SCREEN
MRSA, PCR: NEGATIVE
Staphylococcus aureus: NEGATIVE

## 2021-03-19 LAB — GLUCOSE, CAPILLARY: Glucose-Capillary: 110 mg/dL — ABNORMAL HIGH (ref 70–99)

## 2021-03-19 LAB — PROTIME-INR
INR: 1.1 (ref 0.8–1.2)
Prothrombin Time: 13.8 seconds (ref 11.4–15.2)

## 2021-03-19 LAB — APTT: aPTT: 29 seconds (ref 24–36)

## 2021-03-19 NOTE — Telephone Encounter (Signed)
Patient called. He says he has been taking the 10.25 oxycodone but Dr.Xu prescribed 3.25 for after surgery. Would like to know if he could keep taking the 10.25. he is at the pharmacy. His call back number is [phone]

## 2021-03-19 NOTE — Progress Notes (Signed)
PCP - Cyndi Bender, PA-C Cardiologist -  denies   PPM/ICD - denies   Chest x-ray - 09/12/20 EKG - 03/03/21, records requested from PCP Stress Test - denies ECHO - 11/13/2015 Cardiac Cath - 05/14/2004  Sleep Study - Pt states he just had a home sleep study last week but has not heard what the results were yet.  DM- Type 2 Fasting Blood Sugar - 90-100 Checks Blood Sugar every other day  Blood Thinner Instructions: n/a Aspirin Instructions: n/a  ERAS Protcol - Yes, G2 given   COVID TEST- 03/19/21 at PAT appt   Anesthesia review: Yes, records requested for previously obtained labs and EKG at PCP appt on 03/03/21. Ebony Hail, [place] aware.  Patient denies shortness of breath, fever, cough and chest pain at PAT appointment   All instructions explained to the patient, with a verbal understanding of the material. Patient agrees to go over the instructions while at home for a better understanding. Patient also instructed to wear a mask in public after being tested for COVID-19. The opportunity to ask questions was provided.

## 2021-03-20 ENCOUNTER — Encounter (HOSPITAL_COMMUNITY): Payer: Self-pay

## 2021-03-20 MED ORDER — TRANEXAMIC ACID 1000 MG/10ML IV SOLN
2000.0000 mg | INTRAVENOUS | Status: DC
  Filled 2021-03-20: qty 20

## 2021-03-20 NOTE — Progress Notes (Signed)
Anesthesia Chart Review:  Case: [id number] Date/Time: 03/23/21 0700   Procedure: RIGHT TOTAL KNEE ARTHROPLASTY (Right: Knee)   Anesthesia type: Spinal   Pre-op diagnosis: RIGHT KNEE DEGENERATIVE JOINT DISEASE   Location: [place] OR ROOM 04 / [place] OR   Surgeons: Leandrew Koyanagi, MD       DISCUSSION: Patient is a 70 year old male scheduled for the above procedure.  History includes former smoker (quit 05/31/04), COPD, HTN, DM2, CKD (stage III), exertional dyspnea, hiatal hernia, hypercholesterolemia, hard of hearing, BPH, PVCs, spinal surgery (L4-5 PLIF 12/25/12), skin cancer (s/p excision BCC), TKA (left 01/08/16).  He reported a recent home sleep study, but has not heard the results as of 03/19/21.   He was evaluated by PCP Cyndi Bender, PA-C on 03/03/21. He was aware of surgery plans. EKG and labs were done. He had a CBC with differential, CMET, POC A1c, PSA on 03/03/21. Results showed an A1c 6.6%, WBC 10.4, hemoglobin 12.2, hematocrit 36.1, platelet count 248, glucose 116, BUN 28, creatinine 2.08, EGFR 34, sodium 141, potassium 4.8, AST 19, ALT 12, PSA < 0.1.  Although his PCP labs are within 30 days of surgery, his Creatinine was up to 2.08 and eGFR 32. Creatinine was 1.4-1.5 from 05/07/20-09/13/20 [facility]), so BMET rechecked at PAT (given the jump in Creatinine in the past six months), and Creatinine was down to 1.77, BUN 16, eGFR 41. PT/PTT normal. Labs results from [facility] reviewed with Aundra Dubin, PA-C. 03/19/21 presurgical COVID-19 test negative. He was unable to provide a urine specimen at PAT, so urinalysis will need to be done on the day of surgery.  Anesthesia team to evaluate on the day of surgery.    VS: BP 117/60   Pulse 88   Temp 36.4 C (Oral)   Resp 18   Ht _0  (1.753 m)   Wt 79.1 kg   SpO2 97%   BMI 25.74 kg/m    PROVIDERS: Cyndi Bender, PA-C is PCP [facility]) - He is not routinely followed by cardiology, but had a preoperative  evaluation by Candee Furbish, MD in 10/2015 due to frequent PVCs. History of syncope in 2010 in setting of dehydration (was working in [employer] on a hot day), but no recurrence and asymptomatic from PVCs. Normal cath in 2005. Echo was ordered to assess for any evidence of cardiomyopathy and showed normal LVEF with normal wall motion. As needed follow-up recommended.    LABS: Labs reviewed: Acceptable for surgery. SEE DISCUSSION. (all labs ordered are listed, but only abnormal results are displayed)  Labs Reviewed  GLUCOSE, CAPILLARY - Abnormal; Notable for the following components:      Result Value   Glucose-Capillary 110 (*)    All other components within normal limits  BASIC METABOLIC PANEL - Abnormal; Notable for the following components:   Glucose, Bld 107 (*)    Creatinine, Ser 1.77 (*)    GFR, Estimated 41 (*)    All other components within normal limits  SARS CORONAVIRUS 2 (TAT 6-24 HRS)  SURGICAL PCR SCREEN  PROTIME-INR  APTT     IMAGES: CXR 09/12/20 [facility]): FINDINGS: The heart size and mediastinal contours are within normal limits. Minimal bibasilar opacities likely reflecting atelectasis, similar to prior is. No focal consolidation. No pneumothorax or pleural effusion. Multilevel degenerative changes in the thoracic spine. IMPRESSION: Minimal bibasilar atelectasis. No acute finding.    MRI L-spine 12/10/19: IMPRESSION: - Lumbar spondylosis and postoperative changes as outlined and most notably  as follows. - At L2-L3, there is progressive multifactorial severe left subarticular narrowing with encroachment upon the descending left L3 nerve root. Progressive moderate right subarticular and central canal stenosis. Progressive bilateral neural foraminal narrowing (moderate/severe right, moderate left). - At L3-L4, postoperative changes from interval posterior decompression. No significant central canal stenosis remains. Persistent moderate multifactorial left  greater than right subarticular narrowing with medialization of the descending left L4 nerve root. Unchanged bilateral neural foraminal narrowing (moderate right, moderate/severe left). - At L4-L5, postoperative changes from interval posterior decompression and posterior spinal fusion. Spinal canal now widely patent. Residual moderate left neural foraminal narrowing. - At L5-S1, postoperative changes from interval posterior decompression. A small residual/recurrent disc protrusion contributes to multifactorial mild right subarticular narrowing, contacting the descending right S1 nerve root. No significant central canal stenosis remains. Susceptibility artifact from spinal fusion hardware limits evaluation of the bilateral neural foramina. - Small (measuring up to 5 mm) T2 hyperintense and enhancing lesions within the T11, T12, L1 and L2 vertebral bodies which were not definitively present on prior MRI and are indeterminate. Consider a contrast-enhanced MRI follow-up to ensure stability. - Redemonstrated incidental lipoma within the dorsal paraspinal soft tissues on the right centered at the T12 level.    EKG: 03/02/21 ([facility]): NSR. First degree AV block (PR  122 ms). Poor r wave progression, consider anteroseptal infarct (chronic when compared to 2017 EKG per PCP notation). Two pages of EKG received and occasional PVCs also present. He also has a history of PVCs.    CV: Echo 11/13/15: Study Conclusions  - Left ventricle: The cavity size was normal. Wall thickness was    normal. Systolic function was normal. The estimated ejection    fraction was in the range of 55% to 60%. Wall motion was normal;    there were no regional wall motion abnormalities. Left    ventricular diastolic function parameters were normal.  - Right ventricle: The cavity size was mildly dilated.  - Right atrium: The atrium was mildly dilated.  Impressions:  - Frequent ectopy  seen. The strain analysis is inaccurate due to    the irregular rhythm.   Per my previous anesthesia note from Date of Service 12/14/12, "Cardiac cath on 05/14/04 showed normal coronaries, normal LV systolic function, normal renals, and normal abdominal aorta."   Past Medical History:  Diagnosis Date   Arthritis    takes gabapentin for chronic knee pain   Basal cell carcinoma of skin of face    left   CKD (chronic kidney disease)    COPD (chronic obstructive pulmonary disease) ([place])    Depression    Diabetes mellitus without complication ([place])    Type II   DOE (dyspnea on exertion)    Dysrhythmia    PVCs, saw cardiologist Candee Furbish, MD in 2017   Enlarged prostate    Hard of hearing    History of hiatal hernia 2019   History of kidney stones    History of pneumonia    Hypercholesteremia    Hypertension    Nocturia    "controlled with medicines"    Past Surgical History:  Procedure Laterality Date   BACK SURGERY  2014   lumbar laminectomy   BASAL CELL CARCINOMA EXCISION     left side of face   CARDIAC CATHETERIZATION  05/14/04   normal coronaries, LV systolic function, renals, abdominal aorta (Dr. Myrtice Lauth, [facility])   COLONOSCOPY     DISTAL BICEPS TENDON REPAIR Left  11/14/2015   Procedure: LEFT DISTAL BICEPS TENDON REPAIR;  Surgeon: Leandrew Koyanagi, MD;  Location: [place];  Service: Orthopedics;  Laterality: Left;   TOTAL KNEE ARTHROPLASTY Left 01/08/2016   Procedure: LEFT TOTAL KNEE ARTHROPLASTY;  Surgeon: Leandrew Koyanagi, MD;  Location: [place];  Service: Orthopedics;  Laterality: Left;    MEDICATIONS:  albuterol (PROVENTIL) (2.5 MG/3ML) 0.083% nebulizer solution   albuterol (VENTOLIN HFA) 108 (90 Base) MCG/ACT inhaler   aspirin EC 81 MG tablet   budesonide-formoterol (SYMBICORT) 160-4.5 MCG/ACT inhaler   cephALEXin (KEFLEX) 500 MG capsule   cyclobenzaprine (FLEXERIL) 10 MG tablet   docusate sodium (COLACE) 100 MG capsule   finasteride (PROSCAR) 5 MG  tablet   lisinopril-hydrochlorothiazide (PRINZIDE,ZESTORETIC) 20-25 MG tablet   metFORMIN (GLUCOPHAGE-XR) 500 MG 24 hr tablet   methocarbamol (ROBAXIN) 500 MG tablet   naloxone (NARCAN) nasal spray 4 mg/0.1 mL   ondansetron (ZOFRAN) 4 MG tablet   oxyCODONE-acetaminophen (PERCOCET) 10-325 MG tablet   oxyCODONE-acetaminophen (PERCOCET) 5-325 MG tablet   tamsulosin (FLOMAX) 0.4 MG CAPS   traZODone (DESYREL) 100 MG tablet   No current facility-administered medications for this encounter.  He is not yet on ASA, Robaxin, Percocet, or Keflex (ordered for post-op). He is not taking Flexeril.    Myra Gianotti, PA-C Surgical Short Stay/Anesthesiology [facility] Phone [phone] [facility] Phone [phone] 03/20/2021 1:16 PM

## 2021-03-20 NOTE — Telephone Encounter (Signed)
Where do I need to send it?

## 2021-03-20 NOTE — Telephone Encounter (Signed)
Contacted patient and he states that he has always used current pharmacy and does not know of any other pharmacy to have medication prescribed to or if medication would be approved at another pharmacy. He states that he is currently on 10/325 and if he has surgery and is placed on 5/325 twice a day that would not help with his pain. He is questioning if he should move forward with surgery on Monday if medication agreements are not clear.

## 2021-03-20 NOTE — Anesthesia Preprocedure Evaluation (Addendum)
Anesthesia Evaluation  Patient identified by MRN, date of birth, ID band Patient awake    Reviewed: Allergy & Precautions, NPO status , Patient's Chart, lab work & pertinent test results  History of Anesthesia Complications Negative for: history of anesthetic complications  Airway Mallampati: II  TM Distance: >3 FB Neck ROM: Full    Dental  (+) Edentulous Lower, Edentulous Upper   Pulmonary COPD,  COPD inhaler, former smoker,    Pulmonary exam normal        Cardiovascular hypertension, Normal cardiovascular exam     Neuro/Psych Depression S/P L4-5 PLIF    GI/Hepatic Neg liver ROS, hiatal hernia,   Endo/Other  diabetes, Type 2, Oral Hypoglycemic Agents  Renal/GU Renal InsufficiencyRenal disease  negative genitourinary   Musculoskeletal  (+) Arthritis ,   Abdominal   Peds  Hematology negative hematology ROS (+)   Anesthesia Other Findings Day of surgery medications reviewed with patient.  Reproductive/Obstetrics negative OB ROS                           Anesthesia Physical Anesthesia Plan  ASA: 2  Anesthesia Plan: Spinal   Post-op Pain Management:  Regional for Post-op pain   Induction:   PONV Risk Score and Plan: 2 and Treatment may vary due to age or medical condition, Ondansetron, Propofol infusion and Dexamethasone  Airway Management Planned: Natural Airway and Simple Face Mask  Additional Equipment: None  Intra-op Plan:   Post-operative Plan:   Informed Consent: I have reviewed the patients History and Physical, chart, labs and discussed the procedure including the risks, benefits and alternatives for the proposed anesthesia with the patient or authorized representative who has indicated his/her understanding and acceptance.       Plan Discussed with: CRNA  Anesthesia Plan Comments: (PAT note written 03/20/2021 by Myra Gianotti, PA-C. There are additional labs and  EKG from PCP in shadow chart, results outlined in APP note.  )      Anesthesia Quick Evaluation

## 2021-03-20 NOTE — Telephone Encounter (Signed)
Contacted patient and he states that the pharmacy does not have 5/325 so he is not able to get medication.

## 2021-03-20 NOTE — Telephone Encounter (Signed)
I prescribed the 5/325 1-2 every 6-8 hours after surgery.  He can take two which will be equivalent to 10.  There is not a 10.25

## 2021-03-23 ENCOUNTER — Ambulatory Visit (HOSPITAL_COMMUNITY): Payer: Medicare HMO | Admitting: Anesthesiology

## 2021-03-23 ENCOUNTER — Observation Stay (HOSPITAL_COMMUNITY)
Admission: RE | Admit: 2021-03-23 | Discharge: 2021-03-24 | Disposition: A | Discharge status: Home / Self Care | Payer: Medicare HMO | Attending: Orthopaedic Surgery | Admitting: Orthopaedic Surgery

## 2021-03-23 ENCOUNTER — Encounter (HOSPITAL_COMMUNITY)
Admission: RE | Disposition: A | Discharge status: Home / Self Care | Payer: Self-pay | Source: Home / Self Care | Attending: Orthopaedic Surgery

## 2021-03-23 ENCOUNTER — Ambulatory Visit (HOSPITAL_COMMUNITY): Payer: Medicare HMO | Admitting: Vascular Surgery

## 2021-03-23 ENCOUNTER — Encounter (HOSPITAL_COMMUNITY): Payer: Self-pay | Admitting: Orthopaedic Surgery

## 2021-03-23 ENCOUNTER — Other Ambulatory Visit: Payer: Self-pay

## 2021-03-23 ENCOUNTER — Observation Stay (HOSPITAL_COMMUNITY): Payer: Medicare HMO

## 2021-03-23 ENCOUNTER — Telehealth: Payer: Self-pay

## 2021-03-23 DIAGNOSIS — R52 Pain, unspecified: Secondary | ICD-10-CM

## 2021-03-23 DIAGNOSIS — Z96651 Presence of right artificial knee joint: Secondary | ICD-10-CM | POA: Diagnosis not present

## 2021-03-23 DIAGNOSIS — G8918 Other acute postprocedural pain: Secondary | ICD-10-CM | POA: Diagnosis not present

## 2021-03-23 DIAGNOSIS — Z85828 Personal history of other malignant neoplasm of skin: Secondary | ICD-10-CM | POA: Diagnosis not present

## 2021-03-23 DIAGNOSIS — M1711 Unilateral primary osteoarthritis, right knee: Secondary | ICD-10-CM | POA: Diagnosis not present

## 2021-03-23 DIAGNOSIS — I129 Hypertensive chronic kidney disease with stage 1 through stage 4 chronic kidney disease, or unspecified chronic kidney disease: Secondary | ICD-10-CM | POA: Diagnosis not present

## 2021-03-23 DIAGNOSIS — J449 Chronic obstructive pulmonary disease, unspecified: Secondary | ICD-10-CM | POA: Insufficient documentation

## 2021-03-23 DIAGNOSIS — N189 Chronic kidney disease, unspecified: Secondary | ICD-10-CM | POA: Insufficient documentation

## 2021-03-23 DIAGNOSIS — E1122 Type 2 diabetes mellitus with diabetic chronic kidney disease: Secondary | ICD-10-CM | POA: Insufficient documentation

## 2021-03-23 DIAGNOSIS — E7800 Pure hypercholesterolemia, unspecified: Secondary | ICD-10-CM | POA: Diagnosis not present

## 2021-03-23 DIAGNOSIS — Z471 Aftercare following joint replacement surgery: Secondary | ICD-10-CM | POA: Diagnosis not present

## 2021-03-23 HISTORY — PX: TOTAL KNEE ARTHROPLASTY: SHX125

## 2021-03-23 LAB — URINALYSIS, ROUTINE W REFLEX MICROSCOPIC
Bilirubin Urine: NEGATIVE
Glucose, UA: NEGATIVE mg/dL
Hgb urine dipstick: NEGATIVE
Ketones, ur: NEGATIVE mg/dL
Leukocytes,Ua: NEGATIVE
Nitrite: NEGATIVE
Protein, ur: NEGATIVE mg/dL
Specific Gravity, Urine: 1.019 (ref 1.005–1.030)
pH: 6 (ref 5.0–8.0)

## 2021-03-23 LAB — GLUCOSE, CAPILLARY
Glucose-Capillary: 120 mg/dL — ABNORMAL HIGH (ref 70–99)
Glucose-Capillary: 94 mg/dL (ref 70–99)
Glucose-Capillary: 109 mg/dL — ABNORMAL HIGH (ref 70–99)
Glucose-Capillary: 119 mg/dL — ABNORMAL HIGH (ref 70–99)

## 2021-03-23 LAB — HEMOGLOBIN A1C
Hgb A1c MFr Bld: 6.6 % — ABNORMAL HIGH (ref 4.8–5.6)
Mean Plasma Glucose: 142.72 mg/dL

## 2021-03-23 SURGERY — ARTHROPLASTY, KNEE, TOTAL
Anesthesia: Spinal | Site: Knee | Laterality: Right

## 2021-03-23 MED ORDER — FENTANYL CITRATE (PF) 250 MCG/5ML IJ SOLN
INTRAMUSCULAR | Status: AC
  Filled 2021-03-23: qty 5

## 2021-03-23 MED ORDER — PROPOFOL 10 MG/ML IV BOLUS
INTRAVENOUS | Status: AC
  Filled 2021-03-23: qty 20

## 2021-03-23 MED ORDER — BUPIVACAINE-MELOXICAM ER 400-12 MG/14ML IJ SOLN
INTRAMUSCULAR | Status: AC
  Filled 2021-03-23: qty 1

## 2021-03-23 MED ORDER — CELECOXIB 200 MG PO CAPS: 200.0000 mg | ORAL_CAPSULE | Freq: Two times a day (BID) | ORAL | 3 refills | Status: DC

## 2021-03-23 MED ORDER — ORAL CARE MOUTH RINSE: 15.0000 mL | Freq: Once | OROMUCOSAL | Status: AC

## 2021-03-23 MED ORDER — MIDAZOLAM HCL 2 MG/2ML IJ SOLN
INTRAMUSCULAR | Status: AC
  Filled 2021-03-23: qty 2

## 2021-03-23 MED ORDER — ALBUTEROL SULFATE (2.5 MG/3ML) 0.083% IN NEBU: 2.5000 mg | INHALATION_SOLUTION | RESPIRATORY_TRACT | Status: DC | PRN

## 2021-03-23 MED ORDER — ACETAMINOPHEN 500 MG PO TABS
1000.0000 mg | ORAL_TABLET | Freq: Four times a day (QID) | ORAL | Status: AC
  Administered 2021-03-23 – 2021-03-24 (×4): 1000 mg via ORAL
  Filled 2021-03-23 (×4): qty 2

## 2021-03-23 MED ORDER — CHLORHEXIDINE GLUCONATE 0.12 % MT SOLN
15.0000 mL | Freq: Once | OROMUCOSAL | Status: AC
  Administered 2021-03-23: 15 mL via OROMUCOSAL
  Filled 2021-03-23: qty 15

## 2021-03-23 MED ORDER — METOCLOPRAMIDE HCL 5 MG/ML IJ SOLN
5.0000 mg | Freq: Three times a day (TID) | INTRAMUSCULAR | Status: DC | PRN
  Administered 2021-03-23: 10 mg via INTRAVENOUS
  Filled 2021-03-23: qty 2

## 2021-03-23 MED ORDER — BUPIVACAINE LIPOSOME 1.3 % IJ SUSP
INTRAMUSCULAR | Status: AC
  Filled 2021-03-23: qty 20

## 2021-03-23 MED ORDER — IPRATROPIUM-ALBUTEROL 0.5-2.5 (3) MG/3ML IN SOLN
RESPIRATORY_TRACT | Status: AC
  Filled 2021-03-23: qty 3

## 2021-03-23 MED ORDER — ACETAMINOPHEN 500 MG PO TABS
1000.0000 mg | ORAL_TABLET | Freq: Once | ORAL | Status: AC
  Administered 2021-03-23: 1000 mg via ORAL
  Filled 2021-03-23: qty 2

## 2021-03-23 MED ORDER — HYDROMORPHONE HCL 1 MG/ML IJ SOLN
0.5000 mg | INTRAMUSCULAR | Status: DC | PRN
  Administered 2021-03-23 – 2021-03-24 (×3): 1 mg via INTRAVENOUS
  Filled 2021-03-23 (×3): qty 1

## 2021-03-23 MED ORDER — CLONIDINE HCL (ANALGESIA) 100 MCG/ML EP SOLN
EPIDURAL | Status: DC | PRN
  Administered 2021-03-23: 100 ug

## 2021-03-23 MED ORDER — ONDANSETRON HCL 4 MG/2ML IJ SOLN
4.0000 mg | Freq: Four times a day (QID) | INTRAMUSCULAR | Status: DC | PRN
  Administered 2021-03-23 – 2021-03-24 (×2): 4 mg via INTRAVENOUS
  Filled 2021-03-23 (×2): qty 2

## 2021-03-23 MED ORDER — OXYCODONE HCL 5 MG PO TABS: 5.0000 mg | ORAL_TABLET | Freq: Once | ORAL | Status: DC | PRN

## 2021-03-23 MED ORDER — INSULIN ASPART 100 UNIT/ML IJ SOLN
0.0000 [IU] | Freq: Three times a day (TID) | INTRAMUSCULAR | Status: DC
  Administered 2021-03-24: 3 [IU] via SUBCUTANEOUS

## 2021-03-23 MED ORDER — ASPIRIN 81 MG PO CHEW
81.0000 mg | CHEWABLE_TABLET | Freq: Two times a day (BID) | ORAL | Status: DC
  Administered 2021-03-23 – 2021-03-24 (×2): 81 mg via ORAL
  Filled 2021-03-23 (×2): qty 1

## 2021-03-23 MED ORDER — PHENYLEPHRINE 40 MCG/ML (10ML) SYRINGE FOR IV PUSH (FOR BLOOD PRESSURE SUPPORT)
PREFILLED_SYRINGE | INTRAVENOUS | Status: DC | PRN
  Administered 2021-03-23: 80 ug via INTRAVENOUS
  Administered 2021-03-23 (×2): 120 ug via INTRAVENOUS
  Administered 2021-03-23: 80 ug via INTRAVENOUS
  Administered 2021-03-23: 120 ug via INTRAVENOUS

## 2021-03-23 MED ORDER — FENTANYL CITRATE (PF) 100 MCG/2ML IJ SOLN
25.0000 ug | INTRAMUSCULAR | Status: DC | PRN
  Administered 2021-03-23: 50 ug via INTRAVENOUS

## 2021-03-23 MED ORDER — TRAZODONE HCL 50 MG PO TABS
100.0000 mg | ORAL_TABLET | Freq: Every day | ORAL | Status: DC
  Administered 2021-03-23: 100 mg via ORAL
  Filled 2021-03-23: qty 2

## 2021-03-23 MED ORDER — PHENYLEPHRINE 40 MCG/ML (10ML) SYRINGE FOR IV PUSH (FOR BLOOD PRESSURE SUPPORT)
PREFILLED_SYRINGE | INTRAVENOUS | Status: AC
  Filled 2021-03-23: qty 10

## 2021-03-23 MED ORDER — CEFAZOLIN SODIUM-DEXTROSE 2-4 GM/100ML-% IV SOLN
2.0000 g | INTRAVENOUS | Status: AC
  Administered 2021-03-23: 2 g via INTRAVENOUS
  Filled 2021-03-23: qty 100

## 2021-03-23 MED ORDER — 0.9 % SODIUM CHLORIDE (POUR BTL) OPTIME
TOPICAL | Status: DC | PRN
  Administered 2021-03-23: 1000 mL

## 2021-03-23 MED ORDER — PHENOL 1.4 % MT LIQD: 1.0000 | OROMUCOSAL | Status: DC | PRN

## 2021-03-23 MED ORDER — ONDANSETRON HCL 4 MG PO TABS: 4.0000 mg | ORAL_TABLET | Freq: Four times a day (QID) | ORAL | Status: DC | PRN

## 2021-03-23 MED ORDER — BUPIVACAINE-MELOXICAM ER 400-12 MG/14ML IJ SOLN
INTRAMUSCULAR | Status: DC | PRN
  Administered 2021-03-23: 400 mg

## 2021-03-23 MED ORDER — DEXAMETHASONE SODIUM PHOSPHATE 10 MG/ML IJ SOLN
10.0000 mg | Freq: Once | INTRAMUSCULAR | Status: AC
  Administered 2021-03-24: 10 mg via INTRAVENOUS
  Filled 2021-03-23: qty 1

## 2021-03-23 MED ORDER — KETOROLAC TROMETHAMINE 15 MG/ML IJ SOLN
15.0000 mg | Freq: Four times a day (QID) | INTRAMUSCULAR | Status: AC
  Administered 2021-03-23 – 2021-03-24 (×4): 15 mg via INTRAVENOUS
  Filled 2021-03-23 (×4): qty 1

## 2021-03-23 MED ORDER — OXYCODONE HCL ER 10 MG PO T12A: 10.0000 mg | EXTENDED_RELEASE_TABLET | Freq: Two times a day (BID) | ORAL | 0 refills | Status: AC

## 2021-03-23 MED ORDER — CEFAZOLIN SODIUM-DEXTROSE 2-4 GM/100ML-% IV SOLN
2.0000 g | Freq: Four times a day (QID) | INTRAVENOUS | Status: AC
  Administered 2021-03-23 (×2): 2 g via INTRAVENOUS
  Filled 2021-03-23 (×2): qty 100

## 2021-03-23 MED ORDER — VANCOMYCIN HCL 1000 MG IV SOLR
INTRAVENOUS | Status: DC | PRN
  Administered 2021-03-23: 1000 mg

## 2021-03-23 MED ORDER — IPRATROPIUM-ALBUTEROL 0.5-2.5 (3) MG/3ML IN SOLN
3.0000 mL | Freq: Once | RESPIRATORY_TRACT | Status: AC
  Administered 2021-03-23: 3 mL via RESPIRATORY_TRACT

## 2021-03-23 MED ORDER — FENTANYL CITRATE (PF) 100 MCG/2ML IJ SOLN
INTRAMUSCULAR | Status: AC
  Filled 2021-03-23: qty 2

## 2021-03-23 MED ORDER — METHOCARBAMOL 500 MG PO TABS
500.0000 mg | ORAL_TABLET | Freq: Four times a day (QID) | ORAL | Status: DC | PRN
  Administered 2021-03-23: 500 mg via ORAL
  Filled 2021-03-23: qty 1

## 2021-03-23 MED ORDER — SODIUM CHLORIDE 0.9 % IR SOLN
Status: DC | PRN
  Administered 2021-03-23: 1000 mL

## 2021-03-23 MED ORDER — TRANEXAMIC ACID 1000 MG/10ML IV SOLN
INTRAVENOUS | Status: DC | PRN
  Administered 2021-03-23: 2000 mg via TOPICAL

## 2021-03-23 MED ORDER — FENTANYL CITRATE (PF) 100 MCG/2ML IJ SOLN
INTRAMUSCULAR | Status: DC | PRN
  Administered 2021-03-23 (×2): 50 ug via INTRAVENOUS

## 2021-03-23 MED ORDER — VANCOMYCIN HCL 1000 MG IV SOLR
INTRAVENOUS | Status: AC
  Filled 2021-03-23: qty 20

## 2021-03-23 MED ORDER — ACETAMINOPHEN 325 MG PO TABS: 325.0000 mg | ORAL_TABLET | Freq: Four times a day (QID) | ORAL | Status: DC | PRN

## 2021-03-23 MED ORDER — OXYCODONE-ACETAMINOPHEN 10-325 MG PO TABS: 1.0000 | ORAL_TABLET | Freq: Four times a day (QID) | ORAL | 0 refills | Status: DC | PRN

## 2021-03-23 MED ORDER — METHOCARBAMOL 1000 MG/10ML IJ SOLN
500.0000 mg | Freq: Four times a day (QID) | INTRAVENOUS | Status: DC | PRN
  Filled 2021-03-23: qty 5

## 2021-03-23 MED ORDER — POVIDONE-IODINE 10 % EX SWAB
2.0000 "application " | Freq: Once | CUTANEOUS | Status: AC
  Administered 2021-03-23: 2 via TOPICAL

## 2021-03-23 MED ORDER — EPHEDRINE SULFATE-NACL 50-0.9 MG/10ML-% IV SOSY
PREFILLED_SYRINGE | INTRAVENOUS | Status: DC | PRN
  Administered 2021-03-23: 5 mg via INTRAVENOUS
  Administered 2021-03-23: 10 mg via INTRAVENOUS
  Administered 2021-03-23: 5 mg via INTRAVENOUS

## 2021-03-23 MED ORDER — IRRISEPT - 450ML BOTTLE WITH 0.05% CHG IN STERILE WATER, USP 99.95% OPTIME
TOPICAL | Status: DC | PRN
  Administered 2021-03-23: 450 mL via TOPICAL

## 2021-03-23 MED ORDER — SODIUM CHLORIDE 0.9 % IV SOLN: INTRAVENOUS | Status: DC

## 2021-03-23 MED ORDER — OXYCODONE-ACETAMINOPHEN 5-325 MG PO TABS: 1.0000 | ORAL_TABLET | Freq: Three times a day (TID) | ORAL | 0 refills | Status: DC | PRN

## 2021-03-23 MED ORDER — MIDAZOLAM HCL 5 MG/5ML IJ SOLN
INTRAMUSCULAR | Status: DC | PRN
  Administered 2021-03-23 (×2): 1 mg via INTRAVENOUS

## 2021-03-23 MED ORDER — ALBUTEROL SULFATE HFA 108 (90 BASE) MCG/ACT IN AERS: 2.0000 | INHALATION_SPRAY | RESPIRATORY_TRACT | Status: DC | PRN

## 2021-03-23 MED ORDER — METFORMIN HCL ER 500 MG PO TB24
500.0000 mg | ORAL_TABLET | Freq: Two times a day (BID) | ORAL | Status: DC
  Administered 2021-03-23 – 2021-03-24 (×2): 500 mg via ORAL
  Filled 2021-03-23 (×3): qty 1

## 2021-03-23 MED ORDER — ONDANSETRON HCL 4 MG/2ML IJ SOLN
INTRAMUSCULAR | Status: AC
  Filled 2021-03-23: qty 2

## 2021-03-23 MED ORDER — PROPOFOL 500 MG/50ML IV EMUL
INTRAVENOUS | Status: DC | PRN
  Administered 2021-03-23: 100 ug/kg/min via INTRAVENOUS

## 2021-03-23 MED ORDER — OXYCODONE HCL 5 MG PO TABS
10.0000 mg | ORAL_TABLET | ORAL | Status: DC | PRN
  Administered 2021-03-23: 15 mg via ORAL
  Filled 2021-03-23: qty 3

## 2021-03-23 MED ORDER — BUPIVACAINE IN DEXTROSE 0.75-8.25 % IT SOLN
INTRATHECAL | Status: DC | PRN
  Administered 2021-03-23: 1.6 mL via INTRATHECAL

## 2021-03-23 MED ORDER — DOCUSATE SODIUM 100 MG PO CAPS
100.0000 mg | ORAL_CAPSULE | Freq: Two times a day (BID) | ORAL | Status: DC
  Administered 2021-03-23 – 2021-03-24 (×3): 100 mg via ORAL
  Filled 2021-03-23 (×3): qty 1

## 2021-03-23 MED ORDER — TRANEXAMIC ACID-NACL 1000-0.7 MG/100ML-% IV SOLN
1000.0000 mg | INTRAVENOUS | Status: AC
  Administered 2021-03-23: 1000 mg via INTRAVENOUS
  Filled 2021-03-23: qty 100

## 2021-03-23 MED ORDER — OXYCODONE HCL 5 MG PO TABS: 5.0000 mg | ORAL_TABLET | ORAL | Status: DC | PRN

## 2021-03-23 MED ORDER — MENTHOL 3 MG MT LOZG: 1.0000 | LOZENGE | OROMUCOSAL | Status: DC | PRN

## 2021-03-23 MED ORDER — PROPOFOL 1000 MG/100ML IV EMUL
INTRAVENOUS | Status: AC
  Filled 2021-03-23: qty 100

## 2021-03-23 MED ORDER — LACTATED RINGERS IV SOLN: INTRAVENOUS | Status: DC

## 2021-03-23 MED ORDER — OXYCODONE HCL 5 MG/5ML PO SOLN: 5.0000 mg | Freq: Once | ORAL | Status: DC | PRN

## 2021-03-23 MED ORDER — EPHEDRINE 5 MG/ML INJ
INTRAVENOUS | Status: AC
  Filled 2021-03-23: qty 5

## 2021-03-23 MED ORDER — ONDANSETRON HCL 4 MG/2ML IJ SOLN
INTRAMUSCULAR | Status: DC | PRN
  Administered 2021-03-23: 4 mg via INTRAVENOUS

## 2021-03-23 MED ORDER — TAMSULOSIN HCL 0.4 MG PO CAPS
0.4000 mg | ORAL_CAPSULE | Freq: Every morning | ORAL | Status: DC
  Administered 2021-03-24: 0.4 mg via ORAL
  Filled 2021-03-23 (×2): qty 1

## 2021-03-23 MED ORDER — METOCLOPRAMIDE HCL 5 MG PO TABS: 5.0000 mg | ORAL_TABLET | Freq: Three times a day (TID) | ORAL | Status: DC | PRN

## 2021-03-23 MED ORDER — OXYCODONE HCL ER 10 MG PO T12A
10.0000 mg | EXTENDED_RELEASE_TABLET | Freq: Two times a day (BID) | ORAL | Status: DC
  Administered 2021-03-23 – 2021-03-24 (×3): 10 mg via ORAL
  Filled 2021-03-23 (×3): qty 1

## 2021-03-23 MED ORDER — TRANEXAMIC ACID-NACL 1000-0.7 MG/100ML-% IV SOLN
1000.0000 mg | Freq: Once | INTRAVENOUS | Status: AC
  Administered 2021-03-23: 1000 mg via INTRAVENOUS
  Filled 2021-03-23: qty 100

## 2021-03-23 MED ORDER — BUPIVACAINE-EPINEPHRINE (PF) 0.5% -1:200000 IJ SOLN
INTRAMUSCULAR | Status: DC | PRN
  Administered 2021-03-23: 15 mL via PERINEURAL

## 2021-03-23 SURGICAL SUPPLY — 75 items
ALCOHOL 70% 16 OZ (MISCELLANEOUS) ×2 IMPLANT
BAG COUNTER SPONGE SURGICOUNT (BAG) IMPLANT
BAG DECANTER FOR FLEXI CONT (MISCELLANEOUS) ×2 IMPLANT
BANDAGE ESMARK 6X9 LF (GAUZE/BANDAGES/DRESSINGS) IMPLANT
BLADE SAG 18X100X1.27 (BLADE) ×2 IMPLANT
BNDG ESMARK 6X9 LF (GAUZE/BANDAGES/DRESSINGS)
BOWL SMART MIX CTS (DISPOSABLE) ×2 IMPLANT
CEMENT BONE REFOBACIN R1X40 US (Cement) ×4 IMPLANT
CLSR STERI-STRIP ANTIMIC 1/2X4 (GAUZE/BANDAGES/DRESSINGS) ×4 IMPLANT
COMP FEM CMT CR PS 11 RT (Joint) ×2 IMPLANT
COMPONENT FEM CMT CR PS 11 RT (Joint) ×1 IMPLANT
COOLER ICEMAN CLASSIC (MISCELLANEOUS) ×2 IMPLANT
COVER SURGICAL LIGHT HANDLE (MISCELLANEOUS) ×2 IMPLANT
CUFF TOURN SGL QUICK 34 (TOURNIQUET CUFF) ×2
CUFF TOURN SGL QUICK 42 (TOURNIQUET CUFF) IMPLANT
CUFF TRNQT CYL 34X4.125X (TOURNIQUET CUFF) ×1 IMPLANT
DERMABOND ADVANCED (GAUZE/BANDAGES/DRESSINGS) ×1
DERMABOND ADVANCED .7 DNX12 (GAUZE/BANDAGES/DRESSINGS) ×1 IMPLANT
DRAPE EXTREMITY T 121X128X90 (DISPOSABLE) ×2 IMPLANT
DRAPE HALF SHEET 40X57 (DRAPES) ×2 IMPLANT
DRAPE INCISE IOBAN 66X45 STRL (DRAPES) ×2 IMPLANT
DRAPE ORTHO SPLIT 77X108 STRL (DRAPES) ×4
DRAPE POUCH INSTRU U-SHP 10X18 (DRAPES) ×2 IMPLANT
DRAPE SURG ORHT 6 SPLT 77X108 (DRAPES) ×2 IMPLANT
DRAPE U-SHAPE 47X51 STRL (DRAPES) ×4 IMPLANT
DRSG AQUACEL AG ADV 3.5X10 (GAUZE/BANDAGES/DRESSINGS) ×2 IMPLANT
DURAPREP 26ML APPLICATOR (WOUND CARE) ×6 IMPLANT
ELECT CAUTERY BLADE 6.4 (BLADE) ×2 IMPLANT
ELECT REM PT RETURN 9FT ADLT (ELECTROSURGICAL) ×2
ELECTRODE REM PT RTRN 9FT ADLT (ELECTROSURGICAL) ×1 IMPLANT
GLOVE SURG SYN 7.5  E (GLOVE) ×8
GLOVE SURG SYN 7.5 E (GLOVE) ×4 IMPLANT
GLOVE SURG UNDER LTX SZ7.5 (GLOVE) ×4 IMPLANT
GLOVE SURG UNDER POLY LF SZ7 (GLOVE) ×2 IMPLANT
GOWN STRL REIN XL XLG (GOWN DISPOSABLE) ×2 IMPLANT
GOWN STRL REUS W/ TWL LRG LVL3 (GOWN DISPOSABLE) ×1 IMPLANT
GOWN STRL REUS W/TWL LRG LVL3 (GOWN DISPOSABLE) ×2
HANDPIECE INTERPULSE COAX TIP (DISPOSABLE) ×2
HDLS TROCR DRIL PIN KNEE 75 (PIN) ×2
HOOD PEEL AWAY FLYTE STAYCOOL (MISCELLANEOUS) ×4 IMPLANT
INSERT TIBIAL PERSONA 13 RT (Insert) ×2 IMPLANT
JET LAVAGE IRRISEPT WOUND (IRRIGATION / IRRIGATOR) ×2
KIT BASIN OR (CUSTOM PROCEDURE TRAY) ×2 IMPLANT
KIT TURNOVER KIT B (KITS) ×2 IMPLANT
LAVAGE JET IRRISEPT WOUND (IRRIGATION / IRRIGATOR) ×1 IMPLANT
MANIFOLD NEPTUNE II (INSTRUMENTS) ×2 IMPLANT
MARKER SKIN DUAL TIP RULER LAB (MISCELLANEOUS) ×4 IMPLANT
NEEDLE SPNL 18GX3.5 QUINCKE PK (NEEDLE) ×2 IMPLANT
NS IRRIG 1000ML POUR BTL (IV SOLUTION) ×2 IMPLANT
PACK TOTAL JOINT (CUSTOM PROCEDURE TRAY) ×2 IMPLANT
PAD ARMBOARD 7.5X6 YLW CONV (MISCELLANEOUS) ×4 IMPLANT
PAD COLD SHLDR WRAP-ON (PAD) ×2 IMPLANT
PIN DRILL HDLS TROCAR 75 4PK (PIN) ×1 IMPLANT
SAW OSC TIP CART 19.5X105X1.3 (SAW) ×2 IMPLANT
SCREW FEMALE HEX FIX 25X2.5 (ORTHOPEDIC DISPOSABLE SUPPLIES) ×2 IMPLANT
SET HNDPC FAN SPRY TIP SCT (DISPOSABLE) ×1 IMPLANT
STAPLER VISISTAT 35W (STAPLE) IMPLANT
STEM POLY PAT PLY 35M KNEE (Knees) ×2 IMPLANT
STEM TIBIA 5 DEG SZ G R KNEE (Knees) ×1 IMPLANT
SUCTION FRAZIER HANDLE 10FR (MISCELLANEOUS) ×2
SUCTION TUBE FRAZIER 10FR DISP (MISCELLANEOUS) ×1 IMPLANT
SUT ETHILON 2 0 FS 18 (SUTURE) IMPLANT
SUT MNCRL AB 4-0 PS2 18 (SUTURE) IMPLANT
SUT VIC AB 0 CT1 27 (SUTURE) ×4
SUT VIC AB 0 CT1 27XBRD ANBCTR (SUTURE) ×2 IMPLANT
SUT VIC AB 1 CTX 27 (SUTURE) ×6 IMPLANT
SUT VIC AB 2-0 CT1 27 (SUTURE) ×8
SUT VIC AB 2-0 CT1 TAPERPNT 27 (SUTURE) ×4 IMPLANT
SYR 50ML LL SCALE MARK (SYRINGE) ×4 IMPLANT
TIBIA STEM 5 DEG SZ G R KNEE (Knees) ×2 IMPLANT
TOWEL GREEN STERILE (TOWEL DISPOSABLE) ×2 IMPLANT
TOWEL GREEN STERILE FF (TOWEL DISPOSABLE) ×2 IMPLANT
TRAY CATH 16FR W/PLASTIC CATH (SET/KITS/TRAYS/PACK) IMPLANT
UNDERPAD 30X36 HEAVY ABSORB (UNDERPADS AND DIAPERS) ×2 IMPLANT
YANKAUER SUCT BULB TIP NO VENT (SUCTIONS) ×4 IMPLANT

## 2021-03-23 NOTE — H&P (Signed)
PREOPERATIVE H&P  Chief Complaint: RIGHT KNEE DEGENERATIVE JOINT DISEASE  HPI: Chris Washington is a 70 y.o. male who presents for surgical treatment of RIGHT KNEE DEGENERATIVE JOINT DISEASE.  He denies any changes in medical history.  Past Medical History:  Diagnosis Date   Arthritis    takes gabapentin for chronic knee pain   Basal cell carcinoma of skin of face    left   CKD (chronic kidney disease)    COPD (chronic obstructive pulmonary disease) ([facility])    Depression    Diabetes mellitus without complication ([facility])    Type II   DOE (dyspnea on exertion)    Dysrhythmia    PVCs, saw cardiologist Candee Furbish, MD in 2017   Enlarged prostate    Hard of hearing    History of hiatal hernia 2019   History of kidney stones    History of pneumonia    Hypercholesteremia    Hypertension    Nocturia    "controlled with medicines"   Past Surgical History:  Procedure Laterality Date   BACK SURGERY  2014   lumbar laminectomy   BASAL CELL CARCINOMA EXCISION     left side of face   CARDIAC CATHETERIZATION  05/14/04   normal coronaries, LV systolic function, renals, abdominal aorta (Dr. Myrtice Lauth, [facility])   COLONOSCOPY     DISTAL BICEPS TENDON REPAIR Left 11/14/2015   Procedure: LEFT DISTAL BICEPS TENDON REPAIR;  Surgeon: Leandrew Koyanagi, MD;  Location: [place];  Service: Orthopedics;  Laterality: Left;   TOTAL KNEE ARTHROPLASTY Left 01/08/2016   Procedure: LEFT TOTAL KNEE ARTHROPLASTY;  Surgeon: Leandrew Koyanagi, MD;  Location: [place];  Service: Orthopedics;  Laterality: Left;   Social History   Socioeconomic History   Marital status: Married    Spouse name: Not on file   Number of children: Not on file   Years of education: Not on file   Highest education level: Not on file  Occupational History   Not on file  Tobacco Use   Smoking status: Former    Types: Cigarettes    Quit date: 05/31/2004    Years since quitting: 16.8   Smokeless tobacco: Never  Vaping Use    Vaping Use: Never used  Substance and Sexual Activity   Alcohol use: No   Drug use: Yes    Types: Marijuana    Comment: occasional   Sexual activity: Not on file  Other Topics Concern   Not on file  Social History Narrative   Not on file   Social Determinants of Health   Financial Resource Strain: Not on file  Food Insecurity: Not on file  Transportation Needs: Not on file  Physical Activity: Not on file  Stress: Not on file  Social Connections: Not on file   No family history on file. No Known Allergies Prior to Admission medications   Medication Sig Start Date End Date Taking? Authorizing Provider  albuterol (PROVENTIL) (2.5 MG/3ML) 0.083% nebulizer solution Take 2.5 mg by nebulization every 4 (four) hours as needed for wheezing or shortness of breath.   Yes [provider]  albuterol (VENTOLIN HFA) 108 (90 Base) MCG/ACT inhaler Inhale 2 puffs into the lungs every 4 (four) hours as needed. 03/06/21  Yes [provider]  budesonide-formoterol (SYMBICORT) 160-4.5 MCG/ACT inhaler Inhale 2 puffs into the lungs 2 (two) times daily.    Yes [provider]  docusate sodium (COLACE) 100 MG capsule Take 1 capsule (100  mg total) by mouth daily as needed. 03/16/21 03/16/22 Yes Aundra Dubin, PA-C  finasteride (PROSCAR) 5 MG tablet Take 5 mg by mouth every evening.  11/27/15  Yes [provider]  lisinopril-hydrochlorothiazide (PRINZIDE,ZESTORETIC) 20-25 MG tablet Take 1 tablet by mouth daily. 11/27/15  Yes [provider]  metFORMIN (GLUCOPHAGE-XR) 500 MG 24 hr tablet Take 500 mg by mouth 2 (two) times daily.   Yes [provider]  naloxone (NARCAN) nasal spray 4 mg/0.1 mL Place 4 mg into the nose as needed. 11/19/20  Yes [provider]  oxyCODONE-acetaminophen (PERCOCET) 10-325 MG tablet Take 1 tablet by mouth every 4 (four) hours.   Yes [provider]  tamsulosin (FLOMAX) 0.4 MG CAPS Take 1 capsule (0.4 mg total) by  mouth every morning. 12/29/12  Yes Newman Pies, MD  traZODone (DESYREL) 100 MG tablet Take 100 mg by mouth at bedtime. 12/10/15  Yes [provider]  aspirin EC 81 MG tablet Take 1 tablet (81 mg total) by mouth 2 (two) times daily. To be taken after surgery to prevent blood clots 03/16/21   Aundra Dubin, PA-C  cephALEXin (KEFLEX) 500 MG capsule Take 1 capsule (500 mg total) by mouth 4 (four) times daily. To be taken after surgery 03/16/21   Aundra Dubin, PA-C  cyclobenzaprine (FLEXERIL) 10 MG tablet Take 0.5-1 tablets (5-10 mg total) by mouth 2 (two) times daily as needed. Patient not taking: No sig reported 02/08/16   Delos Haring, PA-C  methocarbamol (ROBAXIN) 500 MG tablet Take 1 tablet (500 mg total) by mouth 2 (two) times daily as needed. To be taken after surgery 03/16/21   Aundra Dubin, PA-C  ondansetron (ZOFRAN) 4 MG tablet Take 1 tablet (4 mg total) by mouth every 8 (eight) hours as needed for nausea or vomiting. 03/16/21   Aundra Dubin, PA-C  oxyCODONE-acetaminophen (PERCOCET) 5-325 MG tablet Take 1 tablet by mouth every 4 (four) hours as needed. To be taken after surgery 03/16/21   Aundra Dubin, PA-C     Positive ROS: All other systems have been reviewed and were otherwise negative with the exception of those mentioned in the HPI and as above.  Physical Exam: General: Alert, no acute distress Cardiovascular: No pedal edema Respiratory: No cyanosis, no use of accessory musculature GI: abdomen soft Skin: No lesions in the area of chief complaint Neurologic: Sensation intact distally Psychiatric: Patient is competent for consent with normal mood and affect Lymphatic: no lymphedema  MUSCULOSKELETAL: exam stable  Assessment: RIGHT KNEE DEGENERATIVE JOINT DISEASE  Plan: Plan for Procedure(s): RIGHT TOTAL KNEE ARTHROPLASTY  The risks benefits and alternatives were discussed with the patient including but not limited to the risks of nonoperative  treatment, versus surgical intervention including infection, bleeding, nerve injury,  blood clots, cardiopulmonary complications, morbidity, mortality, among others, and they were willing to proceed.   Preoperative templating of the joint replacement has been completed, documented, and submitted to the Operating Room personnel in order to optimize intra-operative equipment management.   Eduard Roux, MD 03/23/2021 5:44 AM

## 2021-03-23 NOTE — Progress Notes (Signed)
Pt reported chest discomfort to RN at 1011. Dr Daiva Huge, MD notified at 1012. Pt in no acute distress and VS stable. O2 94 on room air. Lung sounds diminished bilaterally. Pt placed on oxygen and order for 12 lead EKG was placed. Pt reassessed at 1023 and expiratory wheezes noted upon auscultation. EKG tech at bedside at 1023. MD called to notify of EKG results at 1025. MD at bedside at 1028. MD evaluated EKG results and pt at bedside. Duoneb ordered and administered to patient at 1030. Pt reported resolved chest discomfort at 1037 with completion of duoneb. VS remained stable. MD made aware of this. No further interventions at this time.

## 2021-03-23 NOTE — Telephone Encounter (Signed)
He is on chronic percocet 10.  Erlinda Hong has refilled this in addition to calling in percocet 5 and oxycontin for breakthrough pain.  Please let her know he is aware

## 2021-03-23 NOTE — Evaluation (Addendum)
Occupational Therapy Evaluation and Discharge Patient Details Name: Chris Washington MRN: [id number] DOB: August 14, 1950 Today's Date: 03/23/2021   History of Present Illness 70  yo male s/p R TKA on 10/24. PMH including arthritis, CKD, DM, COPD, depression, HTN, back sx (2014), cartdiac cath, and TKA L (2017).   Clinical Impression   PTA, pt was living with his wife and was independent. Currently, pt requires Supervision-Min Guard A for ADLs and functional mobility using RW. Provided education on LB ADLs with AE, toilet transfer, and shower transfer; pt verbalized and demonstrated understanding. Pt's wife also reporting she will assist with LB ADLs as needed due to pt's chronic back pain.Answered all pt questions. Recommend dc home once medically stable per physician. All acute OT needs met and will sign off. Thank you.      Recommendations for follow up therapy are one component of a multi-disciplinary discharge planning process, led by the attending physician.  Recommendations may be updated based on patient status, additional functional criteria and insurance authorization.   Follow Up Recommendations  No OT follow up    Assistance Recommended at Discharge Intermittent Supervision/Assistance  Functional Status Assessment  Patient has had a recent decline in their functional status and demonstrates the ability to make significant improvements in function in a reasonable and predictable amount of time.  Equipment Recommendations  BSC    Recommendations for Other Services PT consult     Precautions / Restrictions Precautions Precautions: Knee Restrictions Weight Bearing Restrictions: Yes RLE Weight Bearing: Weight bearing as tolerated      Mobility Bed Mobility Overal bed mobility: Modified Independent                  Transfers Overall transfer level: Needs assistance Equipment used: Rolling walker (2 wheels) Transfers: Sit to/from Stand Sit to Stand: Min guard            General transfer comment: Min Guard A for safety      Balance Overall balance assessment: Needs assistance Sitting-balance support: No upper extremity supported;Feet supported Sitting balance-Leahy Scale: Good     Standing balance support: Bilateral upper extremity supported;During functional activity Standing balance-Leahy Scale: Poor Standing balance comment: Reliant on UE support                           ADL either performed or assessed with clinical judgement   ADL Overall ADL's : Needs assistance/impaired Eating/Feeding: Set up;Sitting   Grooming: Set up;Sitting   Upper Body Bathing: Set up;Sitting   Lower Body Bathing: Min guard;Sit to/from stand   Upper Body Dressing : Set up;Sitting   Lower Body Dressing: Minimal assistance;Sit to/from stand Lower Body Dressing Details (indicate cue type and reason): Educating pt on use of reacher for donning pants. Verbalized understanding. Wife also stating she will assist as needed Toilet Transfer: Min guard;Ambulation (simulated to recliner)   Toileting- Clothing Manipulation and Hygiene: Supervision/safety;Sit to/from stand   Tub/ Shower Transfer: Walk-in shower;Min guard;Ambulation;Rolling walker (2 wheels) (simulated) Tub/Shower Transfer Details (indicate cue type and reason): Provided eduation on safe shower transfer techniques. Pt performing with Min Guard A for safety. Both wife and pt verbalized udnerstanding Functional mobility during ADLs: Min guard;Rolling walker (2 wheels) General ADL Comments: Pt performing ADLs and functional mobility at Supervision- [employer] A level.     Vision         Perception     Praxis      Pertinent Vitals/Pain Pain Assessment:  0-10 Pain Score: 7  Pain Descriptors / Indicators: Discomfort;Grimacing;Throbbing Pain Intervention(s): Monitored during session;Repositioned;Premedicated before session;Limited activity within patient's tolerance     Hand Dominance  Right   Extremity/Trunk Assessment Upper Extremity Assessment Upper Extremity Assessment: Overall WFL for tasks assessed   Lower Extremity Assessment Lower Extremity Assessment: Defer to PT evaluation   Cervical / Trunk Assessment Cervical / Trunk Assessment: Other exceptions Cervical / Trunk Exceptions: chronic back pain   Communication Communication Communication: No difficulties   Cognition Arousal/Alertness: Awake/alert Behavior During Therapy: WFL for tasks assessed/performed Overall Cognitive Status: Within Functional Limits for tasks assessed                                       General Comments  Wife present throughout session    Exercises     Shoulder Instructions      Home Living Family/patient expects to be discharged to:: Private residence Living Arrangements: Spouse/significant other;Children Available Help at Discharge: Family;Available 24 hours/day Type of Home: House Home Access: Stairs to enter [employer] of Steps: 2 Entrance Stairs-Rails: None Home Layout: One level     Bathroom Shower/Tub: Occupational psychologist: Handicapped height     Home Equipment: Civil engineer, contracting - built in;Hand held Engineering geologist (2 wheels)          Prior Functioning/Environment Prior Level of Function : Independent/Modified Independent;Driving (Retired)             Mobility Comments: "I used a walking stick every once in [employer]"          OT Problem List:        OT Treatment/Interventions:      OT Goals(Current goals can be found in the care plan section) Acute Rehab OT Goals Patient Stated Goal: Go home OT Goal Formulation: All assessment and education complete, DC therapy  OT Frequency:     Barriers to D/C:            Co-evaluation              AM-PAC OT "6 Clicks" Daily Activity     Outcome Measure Help from another person eating meals?: None Help from another person taking care of  personal grooming?: None Help from another person toileting, which includes using toliet, bedpan, or urinal?: A Little Help from another person bathing (including washing, rinsing, drying)?: A Little Help from another person to put on and taking off regular upper body clothing?: A Little Help from another person to put on and taking off regular lower body clothing?: A Little 6 Click Score: 20   End of Session Equipment Utilized During Treatment: Rolling walker (2 wheels) Nurse Communication: Mobility status  Activity Tolerance: Patient tolerated treatment well Patient left: in chair;with call bell/phone within reach;with chair alarm set;with family/visitor present  OT Visit Diagnosis: Unsteadiness on feet (R26.81);Other abnormalities of gait and mobility (R26.89);Muscle weakness (generalized) (M62.81);Pain Pain - Right/Left: Right Pain - part of body: Knee                Time: 1520-1544 OT Time Calculation (min): 24 min Charges:  OT General Charges $OT Visit: 1 Visit OT Evaluation $OT Eval Low Complexity: 1 Low OT Treatments $Self Care/Home Management : 8-22 mins  Jaleiah Asay MSOT, OTR/L Acute Rehab Pager: [phone] Office: [place] 03/23/2021, 4:14 PM

## 2021-03-23 NOTE — Op Note (Signed)
Total Knee Arthroplasty Procedure Note  Preoperative diagnosis: Right knee osteoarthritis, varus deformity  Postoperative diagnosis:same  Operative procedure: Right total knee arthroplasty. CPT [phone]  Surgeon: N. Eduard Roux, MD  Assist: Madalyn Rob, PA-C; necessary for the timely completion of procedure and due to complexity of procedure.  Anesthesia: Spinal, regional, local  Tourniquet time: see anesthesia record  Implants used: Zimmer persona Femur: CR 11 narrow Tibia: G Patella: 35 mm Polyethylene: 13 mm, MC  Indication: Chris Washington is a 70 y.o. year old male with a history of knee pain. Having failed conservative management, the patient elected to proceed with a total knee arthroplasty.  We have reviewed the risk and benefits of the surgery and they elected to proceed after voicing understanding.  Procedure:  After informed consent was obtained and understanding of the risk were voiced including but not limited to bleeding, infection, damage to surrounding structures including nerves and vessels, blood clots, leg length inequality and the failure to achieve desired results, the operative extremity was marked with verbal confirmation of the patient in the holding area.   The patient was then brought to the operating room and transported to the operating room table in the supine position.  A tourniquet was applied to the operative extremity around the upper thigh. The operative limb was then prepped and draped in the usual sterile fashion and preoperative antibiotics were administered.  A time out was performed prior to the start of surgery confirming the correct extremity, preoperative antibiotic administration, as well as team members, implants and instruments available for the case. Correct surgical site was also confirmed with preoperative radiographs. The limb was then elevated for exsanguination and the tourniquet was inflated. A midline incision was made and a  standard medial parapatellar approach was performed.  The infrapatellar fat pad was removed.  Suprapatellar synovium was removed to reveal the anterior distal femoral cortex.  A medial peel was performed to release the capsule of the medial tibial plateau.  The patella was then everted which showed severe wear and was prepared and sized to a 35 mm.  A cover was placed on the patella for protection from retractors.  The knee was then brought into full flexion and we then turned our attention to the femur.  The cruciates were sacrificed.  There was severe wear of the femoral condyles worse on the medial femoral condyle.  Start site was drilled in the femur and the intramedullary distal femoral cutting guide was placed, set at 5 degrees valgus, taking 10 mm of distal resection. The distal cut was made. Osteophytes were then removed.  The tibia was then subluxed forward.  The proximal tibial cutting guide was placed with appropriate slope, varus/valgus alignment and depth of resection.  The proximal tibial cut was made. Gap blocks were then used to assess the extension gap and alignment, and appropriate soft tissue releases were performed. Attention was turned back to the femur, which was sized using the sizing guide to a size 11. Appropriate rotation of the femoral component was determined using epicondylar axis, Whiteside's line, and assessing the flexion gap under ligament tension. The appropriate size 4-in-1 cutting block was placed and checked with an angel wing and cuts were made. Posterior femoral osteophytes and uncapped bone were then removed with the curved osteotome.  Trial components were placed, and stability was checked in full extension, mid-flexion, and deep flexion. Proper tibial rotation was determined and marked.  The patella tracked well without a lateral release.  The femoral lugs were then drilled. Trial components were then removed and tibial preparation performed.  The tibia was sized for a  size G component.   The bony surfaces were irrigated with a pulse lavage and then dried. Bone cement was vacuum mixed on the back table, and the final components sized above were cemented into place.  Antibiotic irrigation was placed in the knee joint and soft tissues while the cement cured.  After cement had finished curing, excess cement was removed. The stability of the construct was re-evaluated throughout a range of motion and found to be acceptable. The trial liner was removed, the knee was copiously irrigated, and the knee was re-evaluated for any excess bone debris. The real polyethylene liner, 13 mm thick, was inserted and checked to ensure the locking mechanism had engaged appropriately. The tourniquet was deflated and hemostasis was achieved. The wound was irrigated with normal saline.  One gram of vancomycin powder was placed in the surgical bed.  Capsular closure was performed with a #1 vicryl, subcutaneous fat closed with a 0 vicryl suture, then subcutaneous tissue closed with interrupted 2.0 vicryl suture. The skin was then closed with a 2.0 nylon and dermabond. A sterile dressing was applied.  The patient was awakened in the operating room and taken to recovery in stable condition. All sponge, needle, and instrument counts were correct at the end of the case.  Tawanna Cooler was necessary for opening, closing, retracting, limb positioning and overall facilitation and completion of the surgery.  Position: supine  Complications: none.  Time Out: performed   Drains/Packing: none  Estimated blood loss: minimal  Returned to Recovery Room: in good condition.   Antibiotics: yes   Mechanical VTE (DVT) Prophylaxis: sequential compression devices, TED thigh-high  Chemical VTE (DVT) Prophylaxis: aspirin  Fluid Replacement  Crystalloid: see anesthesia record Blood: none  FFP: none   Specimens Removed: 1 to pathology   Sponge and Instrument Count Correct? yes   PACU: portable  radiograph - knee AP and Lateral   Plan/RTC: Return in 2 weeks for wound check.   Weight Bearing/Load Lower Extremity: full   Implant Name Type Inv. Item Serial No. Manufacturer Lot No. LRB No. Used Action  CEMENT BONE REFOBACIN [id number] [place] - [id number] Cement CEMENT BONE REFOBACIN [id number] [place]  ZIMMER RECON(ORTH,TRAU,BIO,SG) [id number] Right 2 Implanted  TIBIA STEM 5 DEG SZ G R KNEE - [id number] Knees TIBIA STEM 5 DEG SZ G R KNEE  ZIMMER RECON(ORTH,TRAU,BIO,SG) [id number] Right 1 Implanted  persona CR femur cemented     [id number] Right 1 Implanted  STEM POLY PAT PLY 54M KNEE - [id number] Knees STEM POLY PAT PLY 54M KNEE  ZIMMER RECON(ORTH,TRAU,BIO,SG) [id number] Right 1 Implanted  INSERT TIBIAL PERSONA 13 RT - [id number] Insert INSERT TIBIAL PERSONA 13 RT  ZIMMER RECON(ORTH,TRAU,BIO,SG) [id number] Right 1 Implanted    N. Eduard Roux, MD [facility] 8:48 AM

## 2021-03-23 NOTE — Transfer of Care (Signed)
Immediate Anesthesia Transfer of Care Note  Patient: Chris Washington  Procedure(s) Performed: RIGHT TOTAL KNEE ARTHROPLASTY (Right: Knee)  Patient Location: PACU  Anesthesia Type:MAC combined with regional for post-op pain  Level of Consciousness: awake  Airway & Oxygen Therapy: Patient Spontanous Breathing and Patient connected to face mask oxygen  Post-op Assessment: Report given to RN and Post -op Vital signs reviewed and stable  Post vital signs: Reviewed and stable  Last Vitals:  Vitals Value Taken Time  BP 105/71 03/23/21 0927  Temp    Pulse 69 03/23/21 0928  Resp 18 03/23/21 0928  SpO2 95 % 03/23/21 0928  Vitals shown include unvalidated device data.  Last Pain:  Vitals:   03/23/21 0624  TempSrc:   PainSc: 7          Complications: No notable events documented.

## 2021-03-23 NOTE — Telephone Encounter (Signed)
[place] would like a call back concerning the Rx's for patient?  CB# [phone].  Please advise.  Thank you.

## 2021-03-23 NOTE — Telephone Encounter (Signed)
Percocet 5-325 is on manufacture back order. Change to something else?

## 2021-03-23 NOTE — Telephone Encounter (Signed)
Chris Washington with [facility] called needing clarification on Percocet Rx to fill.  Cb# [phone].  Please advise.  Thank you.

## 2021-03-23 NOTE — Progress Notes (Signed)
PT Evaluation  03/23/21 1704  PT Visit Information  Last PT Received On 03/23/21  Assistance Needed +1  History of Present Illness 70  yo male s/p R TKA on 10/24. PMH including arthritis, CKD, DM, COPD, depression, HTN, back sx (2014), cartdiac cath, and TKA L (2017).  Precautions  Precautions Knee  Precaution Booklet Issued No  Precaution Comments Verbally reviewed knee precautions with pt.  Restrictions  Weight Bearing Restrictions Yes  Home Living  Family/patient expects to be discharged to: Private residence  Living Arrangements Spouse/significant other;Children  Available Help at Discharge Family;Available 24 hours/day  Type of [place] to enter  Entrance Stairs-Number of Steps 2  Entrance Stairs-Rails Right  Home Layout One level  Bathroom Shower/Tub Walk-in shower  Bathroom Toilet Handicapped height  Home Equipment Shower seat - built in;Hand held Engineering geologist (2 wheels)  Prior Function  Prior Level of Function  Independent/Modified Independent;Driving (Retired)  Mobility Comments "I used a walking stick every once in [employer]  Communication No difficulties  Pain Assessment  Pain Assessment Faces  Faces Pain Scale 6  Pain Location R knee  Pain Descriptors / Indicators Discomfort;Grimacing;Throbbing  Pain Intervention(s) Limited activity within patient's tolerance;Monitored during session;Repositioned  Cognition  Arousal/Alertness Awake/alert  Behavior During Therapy WFL for tasks assessed/performed  Overall Cognitive Status Within Functional Limits for tasks assessed  Upper Extremity Assessment  Upper Extremity Assessment Defer to OT evaluation  Lower Extremity Assessment  Lower Extremity Assessment RLE deficits/detail  RLE Deficits / Details Deficits consistent with post op pain and weakness.  Cervical / Trunk Assessment  Cervical / Trunk Assessment Other exceptions  Cervical / Trunk Exceptions chronic back pain   Bed Mobility  General bed mobility comments Pt seated with OT present upon entry  Transfers  Overall transfer level Needs assistance  Equipment used Rolling walker (2 wheels)  Transfers Sit to/from Stand  Sit to Stand Min guard  General transfer comment Min Guard A for safety  Ambulation/Gait  Ambulation/Gait assistance Min guard  Gait Distance (Feet) 75 Feet  Assistive device Rolling walker (2 wheels)  Gait Pattern/deviations Step-to pattern;Step-through pattern;Decreased step length - right;Decreased step length - left;Knee flexed in stance - right  General Gait Details Mildly antalgic gait. Min guard for safety. Cues for sequencing using RW.  Gait velocity Decreased  Balance  Overall balance assessment Needs assistance  Sitting-balance support No upper extremity supported;Feet supported  Sitting balance-Leahy Scale Good  Standing balance support Bilateral upper extremity supported;During functional activity  Standing balance-Leahy Scale Poor  Standing balance comment Reliant on UE support  General Comments  General comments (skin integrity, edema, etc.) Wife present during session  PT - End of Session  Equipment Utilized During Treatment Gait belt  Activity Tolerance Patient tolerated treatment well  Patient left in chair;with call bell/phone within reach  Nurse Communication Mobility status  PT Assessment  PT Recommendation/Assessment Patient needs continued PT services  PT Visit Diagnosis Other abnormalities of gait and mobility (R26.89);Pain  Pain - Right/Left Right  Pain - part of body Knee  PT Problem List Decreased strength;Decreased range of motion;Decreased activity tolerance;Decreased balance;Decreased mobility;Decreased knowledge of use of DME;Decreased knowledge of precautions;Pain  PT Plan  PT Frequency (ACUTE ONLY) 7X/week  PT Treatment/Interventions (ACUTE ONLY) DME instruction;Gait training;Functional mobility training;Stair training;Therapeutic  activities;Therapeutic exercise;Balance training;Patient/family education  AM-PAC PT "6 Clicks" Mobility Outcome Measure (Version 2)  Help needed turning from your back to your side while in a flat bed without  using bedrails? 3  Help needed moving from lying on your back to sitting on the side of a flat bed without using bedrails? 3  Help needed moving to and from a bed to a chair (including a wheelchair)? 3  Help needed standing up from a chair using your arms (e.g., wheelchair or bedside chair)? 3  Help needed to walk in hospital room? 3  Help needed climbing 3-5 steps with a railing?  3  6 Click Score 18  Consider Recommendation of Discharge To: Home with [facility]  Progressive Mobility  What is the highest level of mobility based on the progressive mobility assessment? Level 5 (Walks with assist in room/hall) - Balance while stepping forward/back and can walk in room with assist - Complete  Mobility Ambulated with assistance in hallway  PT Recommendation  Follow Up Recommendations Follow physician's recommendations for discharge plan and follow up therapies  Assistance recommended at discharge PRN  PT equipment None recommended by PT  Individuals Consulted  Consulted and Agree with Results and Recommendations Patient;Family member/caregiver  Family Member Consulted wife  Acute Rehab PT Goals  Patient Stated Goal to go home  PT Goal Formulation With patient  Time For Goal Achievement 04/06/21  Potential to Achieve Goals Good  PT Time Calculation  PT Start Time (ACUTE ONLY) 1544  PT Stop Time (ACUTE ONLY) 1554  PT Time Calculation (min) (ACUTE ONLY) 10 min  PT General Charges  $$ ACUTE PT VISIT 1 Visit  PT Evaluation  $PT Eval Low Complexity 1 Low  Written Expression  Dominant Hand Right   Pt s/p surgery above with deficits above. Pt requiring min guard A for transfers and gait using RW. Overall tolerated mobility well. Reviewed knee precautions with pt. Will continue to follow  acutely.   Reuel Derby, PT, DPT  Acute Rehabilitation Services  Pager: [phone] Office: [phone]

## 2021-03-23 NOTE — Anesthesia Procedure Notes (Signed)
Anesthesia Regional Block: Adductor canal block   Pre-Anesthetic Checklist: , timeout performed,  Correct Patient, Correct Site, Correct Laterality,  Correct Procedure, Correct Position, site marked,  Risks and benefits discussed,  Pre-op evaluation,  At surgeon's request and post-op pain management  Laterality: Right  Prep: Maximum Sterile Barrier Precautions used, chloraprep       Needles:  Injection technique: Single-shot  Needle Type: Echogenic Stimulator Needle     Needle Length: 9cm  Needle Gauge: 22     Additional Needles:   Procedures:,,,, ultrasound used (permanent image in chart),,    Narrative:  Start time: 03/23/2021 6:55 AM End time: 03/23/2021 6:58 AM Injection made incrementally with aspirations every 5 mL.  Performed by: Personally  Anesthesiologist: Brennan Bailey, MD  Additional Notes: Risks, benefits, and alternative discussed. Patient gave consent for procedure. Patient prepped and draped in sterile fashion. Sedation administered, patient remains easily responsive to voice. Relevant anatomy identified with ultrasound guidance. Local anesthetic given in 5cc increments with no signs or symptoms of intravascular injection. No pain or paraesthesias with injection. Patient monitored throughout procedure with signs of LAST or immediate complications. Tolerated well. Ultrasound image placed in chart.  Tawny Asal, MD

## 2021-03-23 NOTE — Telephone Encounter (Signed)
Erlinda Hong has send in appropriate meds

## 2021-03-23 NOTE — Discharge Instructions (Signed)

## 2021-03-23 NOTE — Anesthesia Procedure Notes (Addendum)
Spinal  Patient location during procedure: OR Start time: 03/23/2021 7:18 AM End time: 03/23/2021 7:21 AM Reason for block: surgical anesthesia Staffing Performed: anesthesiologist  Anesthesiologist: Brennan Bailey, MD Preanesthetic Checklist Completed: patient identified, IV checked, risks and benefits discussed, surgical consent, monitors and equipment checked, pre-op evaluation and timeout performed Spinal Block Patient position: sitting Prep: DuraPrep and site prepped and draped Patient monitoring: continuous pulse ox, blood pressure and heart rate Approach: midline Location: L3-4 Injection technique: single-shot Needle Needle type: Pencan  Needle gauge: 24 G Needle length: 9 cm Assessment Events: CSF return Additional Notes Risks, benefits, and alternative discussed. Patient gave consent to procedure. Prepped and draped in sitting position. Patient sedated but responsive to voice. Clear CSF obtained after one needle pass. Positive terminal aspiration. No pain or paraesthesias with injection. Patient tolerated procedure well. Vital signs stable. Tawny Asal, MD

## 2021-03-23 NOTE — Anesthesia Procedure Notes (Signed)
Procedure Name: MAC Date/Time: 03/23/2021 7:30 AM Performed by: Erick Colace, CRNA Pre-anesthesia Checklist: Patient identified, Patient being monitored and Timeout performed Patient Re-evaluated:Patient Re-evaluated prior to induction Oxygen Delivery Method: Simple face mask Preoxygenation: Pre-oxygenation with 100% oxygen Induction Type: IV induction Ventilation: Oral airway inserted - appropriate to patient size Dental Injury: Teeth and Oropharynx as per pre-operative assessment

## 2021-03-23 NOTE — Anesthesia Postprocedure Evaluation (Signed)
Anesthesia Post Note  Patient: Chris Washington  Procedure(s) Performed: RIGHT TOTAL KNEE ARTHROPLASTY (Right: Knee)     Patient location during evaluation: PACU Anesthesia Type: Spinal Level of consciousness: awake and alert and oriented Pain management: pain level controlled Vital Signs Assessment: post-procedure vital signs reviewed and stable Respiratory status: spontaneous breathing, nonlabored ventilation and respiratory function stable Cardiovascular status: blood pressure returned to baseline Postop Assessment: no apparent nausea or vomiting, spinal receding, no headache and no backache Anesthetic complications: no Comments: See intraop notes for additional details. Patient initially c/o chest tightness in PACU. EKG unremarkable. Diffuse expiratory wheezes on exam. Chest discomfort resolved after Duoneb administration. Daiva Huge, MD   No notable events documented.  Last Vitals:  Vitals:   03/23/21 1044 03/23/21 1057  BP: (!) 137/55 (!) 152/77  Pulse: 64 60  Resp: 19 18  Temp:  36.7 C  SpO2: 99% 95%    Last Pain:  Vitals:   03/23/21 1057  TempSrc:   PainSc: 0-No pain    LLE Motor Response: No movement due to regional block (03/23/21 1057) LLE Sensation: Decreased;Tingling (03/23/21 1057) RLE Motor Response: No movement due to regional block (03/23/21 1057) RLE Sensation: Decreased;Tingling (03/23/21 1057) L Sensory Level: S1-Sole of foot, small toes (03/23/21 1057) R Sensory Level: S1-Sole of foot, small toes (03/23/21 1057)  Marthenia Rolling

## 2021-03-24 ENCOUNTER — Other Ambulatory Visit: Payer: Self-pay | Admitting: Physician Assistant

## 2021-03-24 DIAGNOSIS — N189 Chronic kidney disease, unspecified: Secondary | ICD-10-CM | POA: Diagnosis not present

## 2021-03-24 DIAGNOSIS — Z96651 Presence of right artificial knee joint: Secondary | ICD-10-CM | POA: Diagnosis not present

## 2021-03-24 DIAGNOSIS — M1711 Unilateral primary osteoarthritis, right knee: Secondary | ICD-10-CM | POA: Diagnosis not present

## 2021-03-24 DIAGNOSIS — I129 Hypertensive chronic kidney disease with stage 1 through stage 4 chronic kidney disease, or unspecified chronic kidney disease: Secondary | ICD-10-CM | POA: Diagnosis not present

## 2021-03-24 DIAGNOSIS — E1122 Type 2 diabetes mellitus with diabetic chronic kidney disease: Secondary | ICD-10-CM | POA: Diagnosis not present

## 2021-03-24 DIAGNOSIS — J449 Chronic obstructive pulmonary disease, unspecified: Secondary | ICD-10-CM | POA: Diagnosis not present

## 2021-03-24 DIAGNOSIS — Z85828 Personal history of other malignant neoplasm of skin: Secondary | ICD-10-CM | POA: Diagnosis not present

## 2021-03-24 DIAGNOSIS — R531 Weakness: Secondary | ICD-10-CM | POA: Diagnosis not present

## 2021-03-24 LAB — GLUCOSE, CAPILLARY
Glucose-Capillary: 100 mg/dL — ABNORMAL HIGH (ref 70–99)
Glucose-Capillary: 171 mg/dL — ABNORMAL HIGH (ref 70–99)

## 2021-03-24 LAB — CBC
HCT: 31.3 % — ABNORMAL LOW (ref 39.0–52.0)
Hemoglobin: 10 g/dL — ABNORMAL LOW (ref 13.0–17.0)
MCH: 29.5 pg (ref 26.0–34.0)
MCHC: 31.9 g/dL (ref 30.0–36.0)
MCV: 92.3 fL (ref 80.0–100.0)
Platelets: 190 10*3/uL (ref 150–400)
RBC: 3.39 MIL/uL — ABNORMAL LOW (ref 4.22–5.81)
RDW: 13.8 % (ref 11.5–15.5)
WBC: 10.6 10*3/uL — ABNORMAL HIGH (ref 4.0–10.5)
nRBC: 0 % (ref 0.0–0.2)

## 2021-03-24 LAB — BASIC METABOLIC PANEL
Anion gap: 5 (ref 5–15)
BUN: 16 mg/dL (ref 8–23)
CO2: 25 mmol/L (ref 22–32)
Calcium: 8.4 mg/dL — ABNORMAL LOW (ref 8.9–10.3)
Chloride: 106 mmol/L (ref 98–111)
Creatinine, Ser: 1.79 mg/dL — ABNORMAL HIGH (ref 0.61–1.24)
GFR, Estimated: 40 mL/min — ABNORMAL LOW (ref >60)
Glucose, Bld: 110 mg/dL — ABNORMAL HIGH (ref 70–99)
Potassium: 4.4 mmol/L (ref 3.5–5.1)
Sodium: 136 mmol/L (ref 135–145)

## 2021-03-24 MED ORDER — OXYCODONE-ACETAMINOPHEN 10-325 MG PO TABS: ORAL_TABLET | ORAL | 0 refills | Status: DC

## 2021-03-24 MED ORDER — HYDROCHLOROTHIAZIDE 25 MG PO TABS
25.0000 mg | ORAL_TABLET | Freq: Every day | ORAL | Status: DC
  Administered 2021-03-24: 25 mg via ORAL
  Filled 2021-03-24: qty 1

## 2021-03-24 MED ORDER — LISINOPRIL 20 MG PO TABS
20.0000 mg | ORAL_TABLET | Freq: Every day | ORAL | Status: DC
  Administered 2021-03-24: 20 mg via ORAL
  Filled 2021-03-24: qty 1

## 2021-03-24 NOTE — TOC Transition Note (Addendum)
Transition of Care [facility]) - CM/SW Discharge Note   Patient Details  Name: Chris Washington MRN: [id number] Date of Birth: 22-Sep-1950  Transition of Care [facility]) CM/SW Contact:  Sharin Mons, RN Phone Number: 03/24/2021, 1:10 PM   Clinical Narrative:    Patient will DC to: home Anticipated DC date: 03/24/2021 Family notified: yes Transport by: car      - s/p R TKA, 10/24 Per MD patient ready for DC today . RN, patient, and patient's family aware of DC.Pt from home with wife. PTA independent with ADL's. States wife and 72 y/o daughter to help with care once d/c. Pt states already has rolling walker. Referral made with [employer] for 3in1/BSC. Equipment will be delivered to bedside prior to d/c . Pt with Rx med concerns. Post hospital f/u noted on AVS.  Wife to provide transportation to home.   RNCM will sign off for now as intervention is no longer needed. Please consult [place] again if new needs arise.    Final next level of care: Home/Self Care Barriers to Discharge: No Barriers Identified   Patient Goals and CMS Choice     Choice offered to / list presented to : Patient  Discharge Placement                       Discharge Plan and Services                DME Arranged: 3-N-1   Date DME Agency Contacted: 03/24/21 Time DME Agency Contacted: 1307 Representative spoke with at DME Agency: [place] ([place]) Interventions     Readmission Risk Interventions No flowsheet data found.

## 2021-03-24 NOTE — Progress Notes (Signed)
Physical Therapy Treatment Patient Details Name: Chris Washington MRN: [id number] DOB: June 28, 1950 Today's Date: 03/24/2021   History of Present Illness 70  yo male s/p R TKA on 10/24. PMH including arthritis, CKD, DM, COPD, depression, HTN, back sx (2014), cartdiac cath, and TKA L (2017).    PT Comments    Pt was seen for mobility on RW with help to balance and to pace his gait.  Tends to rush, requires cues mainly to manage his path and speed.  Pt did stair climbing with repetitive cues, but is both avoiding WB on RLE and requiring reminders of sequence.  Follow him for safety and strengthening, to increase standing balance and endurance, and to help with safe transition to next venue.   Ice applied to R knee and pt is voicing pain control with this modality.   Recommendations for follow up therapy are one component of a multi-disciplinary discharge planning process, led by the attending physician.  Recommendations may be updated based on patient status, additional functional criteria and insurance authorization.  Follow Up Recommendations  Follow physician's recommendations for discharge plan and follow up therapies     Assistance Recommended at Discharge PRN  Equipment Recommendations  None recommended by PT    Recommendations for Other Services       Precautions / Restrictions Precautions Precautions: Knee Precaution Booklet Issued: No Precaution Comments: Verbally reviewed knee precautions with pt. Restrictions Weight Bearing Restrictions: Yes RLE Weight Bearing: Weight bearing as tolerated Other Position/Activity Restrictions: tends to minimize RLE pressure     Mobility  Bed Mobility               General bed mobility comments: up in chair when PT arrived    Transfers Overall transfer level: Needs assistance Equipment used: Rolling walker (2 wheels) Transfers: Sit to/from Stand Sit to Stand: Supervision;Min guard           General transfer comment: Min  Guard A for safety    Ambulation/Gait Ambulation/Gait assistance: Counsellor (Feet): 125 Feet Assistive device: Rolling walker (2 wheels) Gait Pattern/deviations: Step-to pattern;Step-through pattern;Decreased stride length;Decreased weight shift to right Gait velocity: Decreased Gait velocity interpretation: <1.31 ft/sec, indicative of household ambulator General Gait Details: reminders to put R heel down   Stairs Stairs: Yes Stairs assistance: Min guard Stair Management: Forwards;No rails;With walker Number of Stairs: 4 General stair comments: practiced on walker with portable step since pt did not agree to go to rehab gym, but then took a longer walk   Wheelchair Mobility    Modified Rankin (Stroke Patients Only)       Balance Overall balance assessment: Needs assistance Sitting-balance support: Feet supported Sitting balance-Leahy Scale: Good   Postural control: Posterior lean Standing balance support: Bilateral upper extremity supported;During functional activity Standing balance-Leahy Scale: Poor                              Cognition Arousal/Alertness: Awake/alert Behavior During Therapy: WFL for tasks assessed/performed Overall Cognitive Status: Within Functional Limits for tasks assessed                                          Exercises Total Joint Exercises Ankle Circles/Pumps: AROM;5 reps Quad Sets: AROM;5 reps Gluteal Sets: AROM;5 reps    General Comments General comments (skin integrity, edema, etc.): pt was  received in a chair and worked wiht him initially to get up and down portable steps.  He agreed to walk on the hallway and transferred to mobility tech when PT had seen his reasonable safe gait.  Requires reminders to slow down      Pertinent Vitals/Pain Pain Assessment: 0-10 Pain Score: 4  Pain Location: R knee Pain Descriptors / Indicators: Guarding Pain Intervention(s): Limited activity within  patient's tolerance;Monitored during session;Premedicated before session;Repositioned;Ice applied    Home Living                          Prior Function            PT Goals (current goals can now be found in the care plan section) Acute Rehab PT Goals Patient Stated Goal: to go home Progress towards PT goals: Progressing toward goals    Frequency    7X/week      PT Plan Current plan remains appropriate    Co-evaluation              AM-PAC PT "6 Clicks" Mobility   Outcome Measure  Help needed turning from your back to your side while in a flat bed without using bedrails?: A Little Help needed moving from lying on your back to sitting on the side of a flat bed without using bedrails?: A Little Help needed moving to and from a bed to a chair (including a wheelchair)?: A Little Help needed standing up from a chair using your arms (e.g., wheelchair or bedside chair)?: A Little Help needed to walk in hospital room?: A Little Help needed climbing 3-5 steps with a railing? : A Little 6 Click Score: 18    End of Session Equipment Utilized During Treatment: Gait belt Activity Tolerance: Patient tolerated treatment well Patient left: in chair;with call bell/phone within reach Nurse Communication: Mobility status PT Visit Diagnosis: Unsteadiness on feet (R26.81);Pain Pain - Right/Left: Right Pain - part of body: Knee     Time: 0998-3382 PT Time Calculation (min) (ACUTE ONLY): 25 min  Charges:  $Gait Training: 8-22 mins $Therapeutic Exercise: 8-22 mins         Ramond Dial 03/24/2021, 3:24 PM  Mee Hives, PT PhD Acute Rehab Dept. Number: [place]

## 2021-03-24 NOTE — Progress Notes (Signed)
Discharge instructions (including medications) discussed with and copy provided to patient/caregiver 

## 2021-03-24 NOTE — Discharge Summary (Signed)
Patient ID: Chris Washington MRN: [id number] DOB/AGE: 02-21-1951 70 y.o.  Admit date: 03/23/2021 Discharge date: 03/24/2021  Admission Diagnoses:  Principal Problem:   Primary osteoarthritis of right knee Active Problems:   Status post total right knee replacement   Discharge Diagnoses:  Same  Past Medical History:  Diagnosis Date   Arthritis    takes gabapentin for chronic knee pain   Basal cell carcinoma of skin of face    left   CKD (chronic kidney disease)    COPD (chronic obstructive pulmonary disease) ([place])    Depression    Diabetes mellitus without complication (HCC)    Type II   DOE (dyspnea on exertion)    Dysrhythmia    PVCs, saw cardiologist Candee Furbish, MD in 2017   Enlarged prostate    Hard of hearing    History of hiatal hernia 2019   History of kidney stones    History of pneumonia    Hypercholesteremia    Hypertension    Nocturia    "controlled with medicines"    Surgeries: Procedure(s): RIGHT TOTAL KNEE ARTHROPLASTY on 03/23/2021   Consultants:   Discharged Condition: Improved  Hospital Course: TAMEL ABEL is an 70 y.o. male who was admitted 03/23/2021 for operative treatment ofPrimary osteoarthritis of right knee. Patient has severe unremitting pain that affects sleep, daily activities, and work/hobbies. After pre-op clearance the patient was taken to the operating room on 03/23/2021 and underwent  Procedure(s): RIGHT TOTAL KNEE ARTHROPLASTY.    Patient was given perioperative antibiotics:  Anti-infectives (From admission, onward)    Start     Dose/Rate Route Frequency Ordered Stop   03/23/21 1330  ceFAZolin (ANCEF) IVPB 2g/100 mL premix        2 g 200 mL/hr over 30 Minutes Intravenous Every 6 hours 03/23/21 1139 03/23/21 2041   03/23/21 0759  vancomycin (VANCOCIN) powder  Status:  Discontinued          As needed 03/23/21 0759 03/23/21 0924   03/23/21 0600  ceFAZolin (ANCEF) IVPB 2g/100 mL premix        2 g 200 mL/hr over 30 Minutes  Intravenous On call to O.R. 03/23/21 5631 03/23/21 0752        Patient was given sequential compression devices, early ambulation, and chemoprophylaxis to prevent DVT.  Patient benefited maximally from hospital stay and there were no complications.    Recent vital signs: Patient Vitals for the past 24 hrs:  BP Temp Temp src Pulse Resp SpO2  03/24/21 0738 (!) 169/81 98.6 F (37 C) Oral 86 17 93 %  03/23/21 2014 (!) 168/86 98.3 F (36.8 C) -- 73 18 94 %  03/23/21 1447 (!) 150/55 98.3 F (36.8 C) Oral 65 17 97 %  03/23/21 1156 (!) 153/76 (!) 97.5 F (36.4 C) Oral 65 16 99 %  03/23/21 1124 136/67 -- -- 62 13 94 %  03/23/21 1057 (!) 152/77 98 F (36.7 C) -- 60 18 95 %  03/23/21 1044 (!) 137/55 -- -- 64 19 99 %  03/23/21 1030 (!) 141/87 -- -- 60 17 100 %  03/23/21 1022 (!) 143/72 -- -- 63 15 100 %  03/23/21 1012 138/74 -- -- 64 17 96 %  03/23/21 0958 130/75 -- -- 62 15 96 %  03/23/21 0942 127/69 -- -- 62 15 100 %  03/23/21 0927 105/71 98 F (36.7 C) -- 68 16 95 %     Recent laboratory studies:  Recent Labs    03/24/21  0208  WBC 10.6*  HGB 10.0*  HCT 31.3*  PLT 190  NA 136  K 4.4  CL 106  CO2 25  BUN 16  CREATININE 1.79*  GLUCOSE 110*  CALCIUM 8.4*     Discharge Medications:   Allergies as of 03/24/2021   No Known Allergies      Medication List     STOP taking these medications    oxyCODONE-acetaminophen 5-325 MG tablet Commonly known as: Percocet Replaced by: oxyCODONE-acetaminophen 10-325 MG tablet You also have another medication with the same name that you need to continue taking as instructed.       TAKE these medications    albuterol (2.5 MG/3ML) 0.083% nebulizer solution Commonly known as: PROVENTIL Take 2.5 mg by nebulization every 4 (four) hours as needed for wheezing or shortness of breath.   albuterol 108 (90 Base) MCG/ACT inhaler Commonly known as: VENTOLIN HFA Inhale 2 puffs into the lungs every 4 (four) hours as needed.   aspirin  EC 81 MG tablet Take 1 tablet (81 mg total) by mouth 2 (two) times daily. To be taken after surgery to prevent blood clots   budesonide-formoterol 160-4.5 MCG/ACT inhaler Commonly known as: SYMBICORT Inhale 2 puffs into the lungs 2 (two) times daily.   celecoxib 200 MG capsule Commonly known as: CELEBREX Take 1 capsule (200 mg total) by mouth 2 (two) times daily.   cephALEXin 500 MG capsule Commonly known as: Keflex Take 1 capsule (500 mg total) by mouth 4 (four) times daily. To be taken after surgery   cyclobenzaprine 10 MG tablet Commonly known as: FLEXERIL Take 0.5-1 tablets (5-10 mg total) by mouth 2 (two) times daily as needed.   docusate sodium 100 MG capsule Commonly known as: Colace Take 1 capsule (100 mg total) by mouth daily as needed.   finasteride 5 MG tablet Commonly known as: PROSCAR Take 5 mg by mouth every evening.   lisinopril-hydrochlorothiazide 20-25 MG tablet Commonly known as: ZESTORETIC Take 1 tablet by mouth daily.   metFORMIN 500 MG 24 hr tablet Commonly known as: GLUCOPHAGE-XR Take 500 mg by mouth 2 (two) times daily.   methocarbamol 500 MG tablet Commonly known as: Robaxin Take 1 tablet (500 mg total) by mouth 2 (two) times daily as needed. To be taken after surgery   naloxone 4 MG/0.1ML Liqd nasal spray kit Commonly known as: NARCAN Place 4 mg into the nose as needed.   ondansetron 4 MG tablet Commonly known as: Zofran Take 1 tablet (4 mg total) by mouth every 8 (eight) hours as needed for nausea or vomiting.   oxyCODONE 10 mg 12 hr tablet Commonly known as: OXYCONTIN Take 1 tablet (10 mg total) by mouth every 12 (twelve) hours for 5 days.   oxyCODONE-acetaminophen 10-325 MG tablet Commonly known as: PERCOCET Take 1 tablet by mouth every 4 (four) hours. What changed:  Another medication with the same name was added. Make sure you understand how and when to take each. Another medication with the same name was removed. Continue taking  this medication, and follow the directions you see here.   oxyCODONE-acetaminophen 10-325 MG tablet Commonly known as: Percocet Take 1 tablet by mouth every 6 (six) hours as needed for pain. What changed: You were already taking a medication with the same name, and this prescription was added. Make sure you understand how and when to take each. Replaces: oxyCODONE-acetaminophen 5-325 MG tablet   oxyCODONE-acetaminophen 5-325 MG tablet Commonly known as: Percocet Take 1-2 tablets by mouth  every 8 (eight) hours as needed for severe pain. What changed: You were already taking a medication with the same name, and this prescription was added. Make sure you understand how and when to take each.   tamsulosin 0.4 MG Caps capsule Commonly known as: FLOMAX Take 1 capsule (0.4 mg total) by mouth every morning.   traZODone 100 MG tablet Commonly known as: DESYREL Take 100 mg by mouth at bedtime.               Durable Medical Equipment  (From admission, onward)           Start     Ordered   03/23/21 1140  DME Walker rolling  Once       Question Answer Comment  Walker: With 5 Inch Wheels   Patient needs a walker to treat with the following condition Status post left partial knee replacement      03/23/21 1139   03/23/21 1140  DME 3 n 1  Once        03/23/21 1139   03/23/21 1140  DME Bedside commode  Once       Question:  Patient needs a bedside commode to treat with the following condition  Answer:  Status post left partial knee replacement   03/23/21 1139            Diagnostic Studies: DG Knee Right Port  Result Date: 03/23/2021 CLINICAL DATA:  Knee replacement EXAM: PORTABLE RIGHT KNEE - 1-2 VIEW COMPARISON:  01/22/2021. FINDINGS: Two views study shows tricompartmental knee replacement. No evidence for immediate hardware complication. Gas in the joint space and soft tissues is compatible with the immediate postoperative state. IMPRESSION: Status post tricompartmental  knee replacement without evidence for immediate hardware complications. Electronically Signed   By: Misty Stanley M.D.   On: 03/23/2021 10:31    Disposition: Discharge disposition: 01-Home or Self Care          Follow-up Information     Leandrew Koyanagi, MD. Schedule an appointment as soon as possible for a visit in 2 week(s).   Specialty: Orthopedic Surgery Contact information: [address] [place] [phone]                  Signed: Aundra Dubin 03/24/2021, 8:02 AM

## 2021-03-24 NOTE — Telephone Encounter (Signed)
[facility]. They were wanting update on medication. I advised Mendel Ryder submitting rx today.

## 2021-03-24 NOTE — Progress Notes (Signed)
Mobility Specialist Progress Note   03/24/21 1144  Mobility  Activity Ambulated in hall  Level of Assistance Contact guard assist, steadying assist  [place] wheel walker  Distance Ambulated (ft) 186 ft  Mobility Ambulated with assistance in hallway  Mobility Response Tolerated well  Mobility performed by Mobility specialist  Bed Position Chair  $Mobility charge 1 Mobility   Received pt in doorway working w/ PT, transferred off to Mobility Specialist for ambulation in hall. Pt's gait steady but slightly rushed, cued to pace themselves, x1 standing break d/t fatigue. Returned pt back to chair w/ call bell by side and nursing student in room.   Holland Falling Mobility Specialist Phone Number [phone]

## 2021-03-24 NOTE — Telephone Encounter (Signed)
Is this on backorder everywhere then?  This is the majority of what we write for nearly every surgical patient.

## 2021-03-24 NOTE — Telephone Encounter (Signed)
Mendel Ryder will changed medication and submit to pharmacy this morning.

## 2021-03-24 NOTE — Plan of Care (Signed)

## 2021-03-24 NOTE — Plan of Care (Signed)
  Problem: Education: Goal: Knowledge of the prescribed therapeutic regimen will improve Outcome: Adequate for Discharge Goal: Individualized Educational Video(s) Outcome: Adequate for Discharge

## 2021-03-24 NOTE — Progress Notes (Signed)
PT Cancellation Note  Patient Details Name: Chris Washington MRN: [id number] DOB: 1951/04/04   Cancelled Treatment:    Pt was attempted and is now dressed and prepared for DC.  Was uncomfortable an hour ago and was given ice, now expecting to leave soon.  Follow up if dc is held for some reason.    Ramond Dial 03/24/2021, 3:27 PM  Mee Hives, PT PhD Acute Rehab Dept. Number: [place] and [place]

## 2021-03-24 NOTE — Progress Notes (Signed)
Subjective: 1 Day Post-Op Procedure(s) (LRB): RIGHT TOTAL KNEE ARTHROPLASTY (Right) Patient reports pain as moderate.    Objective: Vital signs in last 24 hours: Temp:  [97.5 F (36.4 C)-98.6 F (37 C)] 98.6 F (37 C) (10/25 0738) Pulse Rate:  [60-86] 86 (10/25 0738) Resp:  [13-19] 17 (10/25 0738) BP: (105-169)/(55-87) 169/81 (10/25 0738) SpO2:  [93 %-100 %] 93 % (10/25 0738)  Intake/Output from previous day: 10/24 0701 - 10/25 0700 In: 1640 [P.O.:240; I.V.:1300; IV Piggyback:100] Out: 1425 [Urine:1375; Blood:50] Intake/Output this shift: No intake/output data recorded.  Recent Labs    03/24/21 0208  HGB 10.0*   Recent Labs    03/24/21 0208  WBC 10.6*  RBC 3.39*  HCT 31.3*  PLT 190   Recent Labs    03/24/21 0208  NA 136  K 4.4  CL 106  CO2 25  BUN 16  CREATININE 1.79*  GLUCOSE 110*  CALCIUM 8.4*   No results for input(s): LABPT, INR in the last 72 hours.  Neurologically intact Neurovascular intact Sensation intact distally Intact pulses distally Dorsiflexion/Plantar flexion intact Incision: dressing C/D/I No cellulitis present Compartment soft   Assessment/Plan: 1 Day Post-Op Procedure(s) (LRB): RIGHT TOTAL KNEE ARTHROPLASTY (Right) Advance diet Up with therapy D/C IV fluids Discharge home with home health after second PT session WBAT RLE ABLA- mild and stable Post-op meds electronically sent to pharmacy   Anticipated LOS equal to or greater than 2 midnights due to - Age 43 and older with one or more of the following:  - Obesity  - Expected need for hospital services (PT, OT, Nursing) required for safe  discharge  - Anticipated need for postoperative skilled nursing care or inpatient rehab  - Active co-morbidities: Chronic pain requiring opiods and Diabetes OR   - Unanticipated findings during/Post Surgery: Slow post-op progression: GI, pain control, mobility  - Patient is a high risk of re-admission due to: None   Aundra Dubin 03/24/2021, 8:01 AM

## 2021-03-25 ENCOUNTER — Encounter (HOSPITAL_COMMUNITY): Payer: Self-pay | Admitting: Orthopaedic Surgery

## 2021-03-25 DIAGNOSIS — H9190 Unspecified hearing loss, unspecified ear: Secondary | ICD-10-CM | POA: Diagnosis not present

## 2021-03-25 DIAGNOSIS — N189 Chronic kidney disease, unspecified: Secondary | ICD-10-CM | POA: Diagnosis not present

## 2021-03-25 DIAGNOSIS — E1122 Type 2 diabetes mellitus with diabetic chronic kidney disease: Secondary | ICD-10-CM | POA: Diagnosis not present

## 2021-03-25 DIAGNOSIS — N400 Enlarged prostate without lower urinary tract symptoms: Secondary | ICD-10-CM | POA: Diagnosis not present

## 2021-03-25 DIAGNOSIS — Z471 Aftercare following joint replacement surgery: Secondary | ICD-10-CM | POA: Diagnosis not present

## 2021-03-25 DIAGNOSIS — N200 Calculus of kidney: Secondary | ICD-10-CM | POA: Diagnosis not present

## 2021-03-25 DIAGNOSIS — I129 Hypertensive chronic kidney disease with stage 1 through stage 4 chronic kidney disease, or unspecified chronic kidney disease: Secondary | ICD-10-CM | POA: Diagnosis not present

## 2021-03-25 DIAGNOSIS — J449 Chronic obstructive pulmonary disease, unspecified: Secondary | ICD-10-CM | POA: Diagnosis not present

## 2021-03-25 DIAGNOSIS — E7800 Pure hypercholesterolemia, unspecified: Secondary | ICD-10-CM | POA: Diagnosis not present

## 2021-03-25 DIAGNOSIS — R69 Illness, unspecified: Secondary | ICD-10-CM | POA: Diagnosis not present

## 2021-03-27 DIAGNOSIS — J449 Chronic obstructive pulmonary disease, unspecified: Secondary | ICD-10-CM | POA: Diagnosis not present

## 2021-03-27 DIAGNOSIS — N200 Calculus of kidney: Secondary | ICD-10-CM | POA: Diagnosis not present

## 2021-03-27 DIAGNOSIS — E1122 Type 2 diabetes mellitus with diabetic chronic kidney disease: Secondary | ICD-10-CM | POA: Diagnosis not present

## 2021-03-27 DIAGNOSIS — E7800 Pure hypercholesterolemia, unspecified: Secondary | ICD-10-CM | POA: Diagnosis not present

## 2021-03-27 DIAGNOSIS — R69 Illness, unspecified: Secondary | ICD-10-CM | POA: Diagnosis not present

## 2021-03-27 DIAGNOSIS — Z471 Aftercare following joint replacement surgery: Secondary | ICD-10-CM | POA: Diagnosis not present

## 2021-03-27 DIAGNOSIS — N400 Enlarged prostate without lower urinary tract symptoms: Secondary | ICD-10-CM | POA: Diagnosis not present

## 2021-03-27 DIAGNOSIS — N189 Chronic kidney disease, unspecified: Secondary | ICD-10-CM | POA: Diagnosis not present

## 2021-03-27 DIAGNOSIS — I129 Hypertensive chronic kidney disease with stage 1 through stage 4 chronic kidney disease, or unspecified chronic kidney disease: Secondary | ICD-10-CM | POA: Diagnosis not present

## 2021-03-27 DIAGNOSIS — H9190 Unspecified hearing loss, unspecified ear: Secondary | ICD-10-CM | POA: Diagnosis not present

## 2021-03-28 DIAGNOSIS — N189 Chronic kidney disease, unspecified: Secondary | ICD-10-CM | POA: Diagnosis not present

## 2021-03-28 DIAGNOSIS — J449 Chronic obstructive pulmonary disease, unspecified: Secondary | ICD-10-CM | POA: Diagnosis not present

## 2021-03-28 DIAGNOSIS — N400 Enlarged prostate without lower urinary tract symptoms: Secondary | ICD-10-CM | POA: Diagnosis not present

## 2021-03-28 DIAGNOSIS — R69 Illness, unspecified: Secondary | ICD-10-CM | POA: Diagnosis not present

## 2021-03-28 DIAGNOSIS — E1122 Type 2 diabetes mellitus with diabetic chronic kidney disease: Secondary | ICD-10-CM | POA: Diagnosis not present

## 2021-03-28 DIAGNOSIS — M1711 Unilateral primary osteoarthritis, right knee: Secondary | ICD-10-CM | POA: Diagnosis not present

## 2021-03-28 DIAGNOSIS — I129 Hypertensive chronic kidney disease with stage 1 through stage 4 chronic kidney disease, or unspecified chronic kidney disease: Secondary | ICD-10-CM | POA: Diagnosis not present

## 2021-03-28 DIAGNOSIS — Z471 Aftercare following joint replacement surgery: Secondary | ICD-10-CM | POA: Diagnosis not present

## 2021-03-28 DIAGNOSIS — H9190 Unspecified hearing loss, unspecified ear: Secondary | ICD-10-CM | POA: Diagnosis not present

## 2021-03-28 DIAGNOSIS — Z96651 Presence of right artificial knee joint: Secondary | ICD-10-CM | POA: Diagnosis not present

## 2021-03-28 DIAGNOSIS — E7800 Pure hypercholesterolemia, unspecified: Secondary | ICD-10-CM | POA: Diagnosis not present

## 2021-03-28 DIAGNOSIS — N200 Calculus of kidney: Secondary | ICD-10-CM | POA: Diagnosis not present

## 2021-03-31 DIAGNOSIS — Z471 Aftercare following joint replacement surgery: Secondary | ICD-10-CM | POA: Diagnosis not present

## 2021-03-31 DIAGNOSIS — N400 Enlarged prostate without lower urinary tract symptoms: Secondary | ICD-10-CM | POA: Diagnosis not present

## 2021-03-31 DIAGNOSIS — E1122 Type 2 diabetes mellitus with diabetic chronic kidney disease: Secondary | ICD-10-CM | POA: Diagnosis not present

## 2021-03-31 DIAGNOSIS — H9190 Unspecified hearing loss, unspecified ear: Secondary | ICD-10-CM | POA: Diagnosis not present

## 2021-03-31 DIAGNOSIS — N200 Calculus of kidney: Secondary | ICD-10-CM | POA: Diagnosis not present

## 2021-03-31 DIAGNOSIS — I129 Hypertensive chronic kidney disease with stage 1 through stage 4 chronic kidney disease, or unspecified chronic kidney disease: Secondary | ICD-10-CM | POA: Diagnosis not present

## 2021-03-31 DIAGNOSIS — R69 Illness, unspecified: Secondary | ICD-10-CM | POA: Diagnosis not present

## 2021-03-31 DIAGNOSIS — J449 Chronic obstructive pulmonary disease, unspecified: Secondary | ICD-10-CM | POA: Diagnosis not present

## 2021-03-31 DIAGNOSIS — E7800 Pure hypercholesterolemia, unspecified: Secondary | ICD-10-CM | POA: Diagnosis not present

## 2021-03-31 DIAGNOSIS — N189 Chronic kidney disease, unspecified: Secondary | ICD-10-CM | POA: Diagnosis not present

## 2021-04-01 DIAGNOSIS — H9190 Unspecified hearing loss, unspecified ear: Secondary | ICD-10-CM | POA: Diagnosis not present

## 2021-04-01 DIAGNOSIS — E7800 Pure hypercholesterolemia, unspecified: Secondary | ICD-10-CM | POA: Diagnosis not present

## 2021-04-01 DIAGNOSIS — J449 Chronic obstructive pulmonary disease, unspecified: Secondary | ICD-10-CM | POA: Diagnosis not present

## 2021-04-01 DIAGNOSIS — I129 Hypertensive chronic kidney disease with stage 1 through stage 4 chronic kidney disease, or unspecified chronic kidney disease: Secondary | ICD-10-CM | POA: Diagnosis not present

## 2021-04-01 DIAGNOSIS — N189 Chronic kidney disease, unspecified: Secondary | ICD-10-CM | POA: Diagnosis not present

## 2021-04-01 DIAGNOSIS — N200 Calculus of kidney: Secondary | ICD-10-CM | POA: Diagnosis not present

## 2021-04-01 DIAGNOSIS — Z471 Aftercare following joint replacement surgery: Secondary | ICD-10-CM | POA: Diagnosis not present

## 2021-04-01 DIAGNOSIS — E1122 Type 2 diabetes mellitus with diabetic chronic kidney disease: Secondary | ICD-10-CM | POA: Diagnosis not present

## 2021-04-01 DIAGNOSIS — N400 Enlarged prostate without lower urinary tract symptoms: Secondary | ICD-10-CM | POA: Diagnosis not present

## 2021-04-01 DIAGNOSIS — R69 Illness, unspecified: Secondary | ICD-10-CM | POA: Diagnosis not present

## 2021-04-02 DIAGNOSIS — N200 Calculus of kidney: Secondary | ICD-10-CM | POA: Diagnosis not present

## 2021-04-02 DIAGNOSIS — J449 Chronic obstructive pulmonary disease, unspecified: Secondary | ICD-10-CM | POA: Diagnosis not present

## 2021-04-02 DIAGNOSIS — Z471 Aftercare following joint replacement surgery: Secondary | ICD-10-CM | POA: Diagnosis not present

## 2021-04-02 DIAGNOSIS — E1122 Type 2 diabetes mellitus with diabetic chronic kidney disease: Secondary | ICD-10-CM | POA: Diagnosis not present

## 2021-04-02 DIAGNOSIS — H9190 Unspecified hearing loss, unspecified ear: Secondary | ICD-10-CM | POA: Diagnosis not present

## 2021-04-02 DIAGNOSIS — N400 Enlarged prostate without lower urinary tract symptoms: Secondary | ICD-10-CM | POA: Diagnosis not present

## 2021-04-02 DIAGNOSIS — E7800 Pure hypercholesterolemia, unspecified: Secondary | ICD-10-CM | POA: Diagnosis not present

## 2021-04-02 DIAGNOSIS — I129 Hypertensive chronic kidney disease with stage 1 through stage 4 chronic kidney disease, or unspecified chronic kidney disease: Secondary | ICD-10-CM | POA: Diagnosis not present

## 2021-04-02 DIAGNOSIS — N189 Chronic kidney disease, unspecified: Secondary | ICD-10-CM | POA: Diagnosis not present

## 2021-04-02 DIAGNOSIS — R69 Illness, unspecified: Secondary | ICD-10-CM | POA: Diagnosis not present

## 2021-04-07 ENCOUNTER — Other Ambulatory Visit: Payer: Self-pay | Admitting: Physician Assistant

## 2021-04-07 ENCOUNTER — Other Ambulatory Visit: Payer: Self-pay

## 2021-04-07 ENCOUNTER — Ambulatory Visit (INDEPENDENT_AMBULATORY_CARE_PROVIDER_SITE_OTHER): Payer: Medicare HMO | Admitting: Physician Assistant

## 2021-04-07 ENCOUNTER — Encounter: Payer: Self-pay | Admitting: Orthopaedic Surgery

## 2021-04-07 DIAGNOSIS — G4733 Obstructive sleep apnea (adult) (pediatric): Secondary | ICD-10-CM | POA: Diagnosis not present

## 2021-04-07 DIAGNOSIS — Z96651 Presence of right artificial knee joint: Secondary | ICD-10-CM

## 2021-04-07 MED ORDER — OXYCODONE-ACETAMINOPHEN 10-325 MG PO TABS: 1.0000 | ORAL_TABLET | Freq: Four times a day (QID) | ORAL | 0 refills | Status: DC | PRN

## 2021-04-07 MED ORDER — OXYCODONE-ACETAMINOPHEN 5-325 MG PO TABS: 1.0000 | ORAL_TABLET | Freq: Three times a day (TID) | ORAL | 0 refills | Status: DC | PRN

## 2021-04-07 MED ORDER — OXYCODONE-ACETAMINOPHEN 10-325 MG PO TABS: ORAL_TABLET | ORAL | 0 refills | Status: DC

## 2021-04-07 NOTE — Progress Notes (Signed)
Post-Op Visit Note   Patient: Chris Washington           Date of Birth: 22-Mar-1951           MRN: [id number] Visit Date: 04/07/2021 PCP: Cyndi Bender, PA-C   Assessment & Plan:  Chief Complaint:  Chief Complaint  Patient presents with   Right Knee - Routine Post Op   Visit Diagnoses:  1. Hx of total knee replacement, right     Plan: Patient is a 70 year old gentleman who comes in today 2-week status post right total knee replacement 03/23/2021.  He has been doing well.  He has been getting home health physical therapy and is ambulating with a walker.  Examination of his right knee reveals a well healed surgical incision with nylon sutures in place.  No evidence of infection or cellulitis.  Calf is soft nontender.  At this point, we will transition him to outpatient physical therapy after his last remaining home health physical therapy session this week.  External referral has been made.  He is on chronic oxycodone 10 from pain management in addition oxycodone provided by [place] for breakthrough pain which we have agreed to continue for a total of 6 weeks postoperatively.  He notes that he needs a refill of oxycodone today..  I have agreed to refill the breakthrough prescription.  He will need to get his chronic oxycodone tens from his pain management doctor.  He will follow-up with [place] in 4 weeks time for repeat evaluation and 2 view x-rays of the right knee.  Dental prophylaxis reinforced.  Call with concerns or questions.  Follow-Up Instructions: Return in about 4 weeks (around 05/05/2021).   Orders:  Orders Placed This Encounter  Procedures   Ambulatory referral to Physical Therapy    Meds ordered this encounter  Medications   oxyCODONE-acetaminophen (PERCOCET) 5-325 MG tablet    Sig: Take 1-2 tablets by mouth every 8 (eight) hours as needed.    Dispense:  30 tablet    Refill:  0     Imaging: No new imaging   PMFS History: Patient Active Problem List   Diagnosis Date Noted    Status post total right knee replacement 03/23/2021   Primary osteoarthritis of right knee 01/22/2021   Status post left knee replacement 01/08/2016   Controlled type 2 diabetes mellitus with stage 3 chronic kidney disease, without long-term current use of insulin ([place]) 11/13/2015   Essential hypertension 11/13/2015   PVC's (premature ventricular contractions) 11/13/2015   Past Medical History:  Diagnosis Date   Arthritis    takes gabapentin for chronic knee pain   Basal cell carcinoma of skin of face    left   CKD (chronic kidney disease)    COPD (chronic obstructive pulmonary disease) (HCC)    Depression    Diabetes mellitus without complication (HCC)    Type II   DOE (dyspnea on exertion)    Dysrhythmia    PVCs, saw cardiologist Candee Furbish, MD in 2017   Enlarged prostate    Hard of hearing    History of hiatal hernia 2019   History of kidney stones    History of pneumonia    Hypercholesteremia    Hypertension    Nocturia    "controlled with medicines"    History reviewed. No pertinent family history.  Past Surgical History:  Procedure Laterality Date   BACK SURGERY  2014   lumbar laminectomy   BASAL CELL CARCINOMA EXCISION     left  side of face   CARDIAC CATHETERIZATION  05/14/04   normal coronaries, LV systolic function, renals, abdominal aorta (Dr. Myrtice Lauth, [facility])   COLONOSCOPY     DISTAL BICEPS TENDON REPAIR Left 11/14/2015   Procedure: LEFT DISTAL BICEPS TENDON REPAIR;  Surgeon: Leandrew Koyanagi, MD;  Location: [place];  Service: Orthopedics;  Laterality: Left;   TOTAL KNEE ARTHROPLASTY Left 01/08/2016   Procedure: LEFT TOTAL KNEE ARTHROPLASTY;  Surgeon: Leandrew Koyanagi, MD;  Location: [place];  Service: Orthopedics;  Laterality: Left;   TOTAL KNEE ARTHROPLASTY Right 03/23/2021   Procedure: RIGHT TOTAL KNEE ARTHROPLASTY;  Surgeon: Leandrew Koyanagi, MD;  Location: [place];  Service: Orthopedics;  Laterality: Right;   Social History   Occupational  History   Not on file  Tobacco Use   Smoking status: Former    Types: Cigarettes    Quit date: 05/31/2004    Years since quitting: 16.8   Smokeless tobacco: Never  Vaping Use   Vaping Use: Never used  Substance and Sexual Activity   Alcohol use: No   Drug use: Yes    Types: Marijuana    Comment: occasional   Sexual activity: Not on file

## 2021-04-08 DIAGNOSIS — E7800 Pure hypercholesterolemia, unspecified: Secondary | ICD-10-CM | POA: Diagnosis not present

## 2021-04-08 DIAGNOSIS — Z471 Aftercare following joint replacement surgery: Secondary | ICD-10-CM | POA: Diagnosis not present

## 2021-04-08 DIAGNOSIS — E1122 Type 2 diabetes mellitus with diabetic chronic kidney disease: Secondary | ICD-10-CM | POA: Diagnosis not present

## 2021-04-08 DIAGNOSIS — N200 Calculus of kidney: Secondary | ICD-10-CM | POA: Diagnosis not present

## 2021-04-08 DIAGNOSIS — J449 Chronic obstructive pulmonary disease, unspecified: Secondary | ICD-10-CM | POA: Diagnosis not present

## 2021-04-08 DIAGNOSIS — H9190 Unspecified hearing loss, unspecified ear: Secondary | ICD-10-CM | POA: Diagnosis not present

## 2021-04-08 DIAGNOSIS — I129 Hypertensive chronic kidney disease with stage 1 through stage 4 chronic kidney disease, or unspecified chronic kidney disease: Secondary | ICD-10-CM | POA: Diagnosis not present

## 2021-04-08 DIAGNOSIS — N400 Enlarged prostate without lower urinary tract symptoms: Secondary | ICD-10-CM | POA: Diagnosis not present

## 2021-04-08 DIAGNOSIS — N189 Chronic kidney disease, unspecified: Secondary | ICD-10-CM | POA: Diagnosis not present

## 2021-04-08 DIAGNOSIS — R69 Illness, unspecified: Secondary | ICD-10-CM | POA: Diagnosis not present

## 2021-04-13 DIAGNOSIS — M1711 Unilateral primary osteoarthritis, right knee: Secondary | ICD-10-CM | POA: Diagnosis not present

## 2021-04-13 DIAGNOSIS — Z4789 Encounter for other orthopedic aftercare: Secondary | ICD-10-CM | POA: Diagnosis not present

## 2021-04-13 DIAGNOSIS — Z96651 Presence of right artificial knee joint: Secondary | ICD-10-CM | POA: Diagnosis not present

## 2021-04-15 DIAGNOSIS — Z96651 Presence of right artificial knee joint: Secondary | ICD-10-CM | POA: Diagnosis not present

## 2021-04-15 DIAGNOSIS — M1711 Unilateral primary osteoarthritis, right knee: Secondary | ICD-10-CM | POA: Diagnosis not present

## 2021-04-15 DIAGNOSIS — Z4789 Encounter for other orthopedic aftercare: Secondary | ICD-10-CM | POA: Diagnosis not present

## 2021-04-17 DIAGNOSIS — M1711 Unilateral primary osteoarthritis, right knee: Secondary | ICD-10-CM | POA: Diagnosis not present

## 2021-04-17 DIAGNOSIS — Z4789 Encounter for other orthopedic aftercare: Secondary | ICD-10-CM | POA: Diagnosis not present

## 2021-04-17 DIAGNOSIS — Z96651 Presence of right artificial knee joint: Secondary | ICD-10-CM | POA: Diagnosis not present

## 2021-04-27 DIAGNOSIS — M461 Sacroiliitis, not elsewhere classified: Secondary | ICD-10-CM | POA: Diagnosis not present

## 2021-04-27 DIAGNOSIS — J449 Chronic obstructive pulmonary disease, unspecified: Secondary | ICD-10-CM | POA: Diagnosis not present

## 2021-05-04 DIAGNOSIS — M1711 Unilateral primary osteoarthritis, right knee: Secondary | ICD-10-CM | POA: Diagnosis not present

## 2021-05-04 DIAGNOSIS — Z4789 Encounter for other orthopedic aftercare: Secondary | ICD-10-CM | POA: Diagnosis not present

## 2021-05-04 DIAGNOSIS — Z96651 Presence of right artificial knee joint: Secondary | ICD-10-CM | POA: Diagnosis not present

## 2021-05-05 ENCOUNTER — Encounter: Payer: Self-pay | Admitting: Orthopaedic Surgery

## 2021-05-05 ENCOUNTER — Other Ambulatory Visit: Payer: Self-pay

## 2021-05-05 ENCOUNTER — Ambulatory Visit (INDEPENDENT_AMBULATORY_CARE_PROVIDER_SITE_OTHER): Payer: Medicare HMO

## 2021-05-05 ENCOUNTER — Ambulatory Visit (INDEPENDENT_AMBULATORY_CARE_PROVIDER_SITE_OTHER): Payer: Medicare HMO | Admitting: Orthopaedic Surgery

## 2021-05-05 DIAGNOSIS — Z96651 Presence of right artificial knee joint: Secondary | ICD-10-CM

## 2021-05-05 NOTE — Progress Notes (Signed)
Post-Op Visit Note   Patient: Chris Washington           Date of Birth: [birth year]           MRN: [id number] Visit Date: 05/05/2021 PCP: Cyndi Bender, PA-C   Assessment & Plan:  Chief Complaint:  Chief Complaint  Patient presents with   Right Knee - Pain   Visit Diagnoses:  1. Hx of total knee replacement, right     Plan: Patient is a pleasant 70 year old gentleman who comes in today 6 weeks status post right total knee replacement 03/23/2021.  He has been doing well.  He has been in physical therapy making great progress.  Examination of the right knee reveals a fully healed surgical scar without complication.  Range of motion 5 to 120 degrees.  He is stable valgus varus stress.  He is neurovascular tact distally.  At this point, he will continue to advance with activity as tolerated.  He will follow-up with [place] in 6 weeks time for repeat evaluation.  Call with concerns or questions in the meantime.  Follow-Up Instructions: Return in about 6 weeks (around 06/16/2021).   Orders:  Orders Placed This Encounter  Procedures   XR Knee 1-2 Views Right   No orders of the defined types were placed in this encounter.   Imaging: XR Knee 1-2 Views Right  Result Date: 05/05/2021 X-rays demonstrate a well-seated prosthesis without complication   PMFS History: Patient Active Problem List   Diagnosis Date Noted   Status post total right knee replacement 03/23/2021   Primary osteoarthritis of right knee 01/22/2021   Status post left knee replacement 01/08/2016   Controlled type 2 diabetes mellitus with stage 3 chronic kidney disease, without long-term current use of insulin ([place]) 11/13/2015   Essential hypertension 11/13/2015   PVC's (premature ventricular contractions) 11/13/2015   Past Medical History:  Diagnosis Date   Arthritis    takes gabapentin for chronic knee pain   Basal cell carcinoma of skin of face    left   CKD (chronic kidney disease)    COPD (chronic obstructive  pulmonary disease) ([place])    Depression    Diabetes mellitus without complication (HCC)    Type II   DOE (dyspnea on exertion)    Dysrhythmia    PVCs, saw cardiologist Candee Furbish, MD in 2017   Enlarged prostate    Hard of hearing    History of hiatal hernia 2019   History of kidney stones    History of pneumonia    Hypercholesteremia    Hypertension    Nocturia    "controlled with medicines"    History reviewed. No pertinent family history.  Past Surgical History:  Procedure Laterality Date   BACK SURGERY  2014   lumbar laminectomy   BASAL CELL CARCINOMA EXCISION     left side of face   CARDIAC CATHETERIZATION  05/14/04   normal coronaries, LV systolic function, renals, abdominal aorta (Dr. Myrtice Lauth, [facility])   COLONOSCOPY     DISTAL BICEPS TENDON REPAIR Left 11/14/2015   Procedure: LEFT DISTAL BICEPS TENDON REPAIR;  Surgeon: Leandrew Koyanagi, MD;  Location: [place];  Service: Orthopedics;  Laterality: Left;   TOTAL KNEE ARTHROPLASTY Left 01/08/2016   Procedure: LEFT TOTAL KNEE ARTHROPLASTY;  Surgeon: Leandrew Koyanagi, MD;  Location: [place];  Service: Orthopedics;  Laterality: Left;   TOTAL KNEE ARTHROPLASTY Right 03/23/2021   Procedure: RIGHT TOTAL KNEE ARTHROPLASTY;  Surgeon: Leandrew Koyanagi,  MD;  Location: [place];  Service: Orthopedics;  Laterality: Right;   Social History   Occupational History   Not on file  Tobacco Use   Smoking status: Former    Types: Cigarettes    Quit date: 05/31/2004    Years since quitting: 16.9   Smokeless tobacco: Never  Vaping Use   Vaping Use: Never used  Substance and Sexual Activity   Alcohol use: No   Drug use: Yes    Types: Marijuana    Comment: occasional   Sexual activity: Not on file

## 2021-05-26 DIAGNOSIS — M461 Sacroiliitis, not elsewhere classified: Secondary | ICD-10-CM | POA: Diagnosis not present

## 2021-05-26 DIAGNOSIS — G894 Chronic pain syndrome: Secondary | ICD-10-CM | POA: Diagnosis not present

## 2021-05-26 DIAGNOSIS — M25561 Pain in right knee: Secondary | ICD-10-CM | POA: Diagnosis not present

## 2021-05-27 DIAGNOSIS — J449 Chronic obstructive pulmonary disease, unspecified: Secondary | ICD-10-CM | POA: Diagnosis not present

## 2021-06-08 DIAGNOSIS — E1121 Type 2 diabetes mellitus with diabetic nephropathy: Secondary | ICD-10-CM | POA: Diagnosis not present

## 2021-06-08 DIAGNOSIS — Z6826 Body mass index (BMI) 26.0-26.9, adult: Secondary | ICD-10-CM | POA: Diagnosis not present

## 2021-06-08 DIAGNOSIS — I700 Atherosclerosis of aorta: Secondary | ICD-10-CM | POA: Diagnosis not present

## 2021-06-08 DIAGNOSIS — M179 Osteoarthritis of knee, unspecified: Secondary | ICD-10-CM | POA: Diagnosis not present

## 2021-06-08 DIAGNOSIS — G4730 Sleep apnea, unspecified: Secondary | ICD-10-CM | POA: Diagnosis not present

## 2021-06-08 DIAGNOSIS — J449 Chronic obstructive pulmonary disease, unspecified: Secondary | ICD-10-CM | POA: Diagnosis not present

## 2021-06-08 DIAGNOSIS — N183 Chronic kidney disease, stage 3 unspecified: Secondary | ICD-10-CM | POA: Diagnosis not present

## 2021-06-08 DIAGNOSIS — I10 Essential (primary) hypertension: Secondary | ICD-10-CM | POA: Diagnosis not present

## 2021-06-08 DIAGNOSIS — E7800 Pure hypercholesterolemia, unspecified: Secondary | ICD-10-CM | POA: Diagnosis not present

## 2021-06-16 ENCOUNTER — Ambulatory Visit (INDEPENDENT_AMBULATORY_CARE_PROVIDER_SITE_OTHER): Payer: Medicare HMO | Admitting: Physician Assistant

## 2021-06-16 ENCOUNTER — Encounter: Payer: Self-pay | Admitting: Orthopaedic Surgery

## 2021-06-16 ENCOUNTER — Other Ambulatory Visit: Payer: Self-pay

## 2021-06-16 DIAGNOSIS — Z96651 Presence of right artificial knee joint: Secondary | ICD-10-CM

## 2021-06-16 NOTE — Progress Notes (Signed)
Post-Op Visit Note   Patient: Chris Washington           Date of Birth: 04/24/1951           MRN: [id number] Visit Date: 06/16/2021 PCP: Cyndi Bender, PA-C   Assessment & Plan:  Chief Complaint:  Chief Complaint  Patient presents with   Right Knee - Follow-up    Right total knee arthroplasty 03/23/2021   Visit Diagnoses:  1. Hx of total knee replacement, right     Plan: Patient is a pleasant 71 year old gentleman who comes in today 3 months status post right total knee replacement 03/23/2021.  He has been doing great.  He notes very intermittent pain.  He has finished formal physical therapy but continues to work on a home exercise program.  Examination of the right knee reveals a fully healed surgical scar without complication.  Range of motion 5 to 110 degrees.  He is stable valgus varus stress.  He is neurovascular tact distally.  At this point, he will continue with his home exercise program specifically to work on extension.  Dental prophylaxis reinforced.  Follow-up with [place] in 3 months time for repeat evaluation and 2 view x-rays of the right knee.  Call with concerns or questions.  Follow-Up Instructions: Return in about 3 months (around 09/14/2021).   Orders:  No orders of the defined types were placed in this encounter.  No orders of the defined types were placed in this encounter.   Imaging: No new imaging  PMFS History: Patient Active Problem List   Diagnosis Date Noted   Status post total right knee replacement 03/23/2021   Primary osteoarthritis of right knee 01/22/2021   Status post left knee replacement 01/08/2016   Controlled type 2 diabetes mellitus with stage 3 chronic kidney disease, without long-term current use of insulin ([place]) 11/13/2015   Essential hypertension 11/13/2015   PVC's (premature ventricular contractions) 11/13/2015   Past Medical History:  Diagnosis Date   Arthritis    takes gabapentin for chronic knee pain   Basal cell carcinoma of  skin of face    left   CKD (chronic kidney disease)    COPD (chronic obstructive pulmonary disease) ([place])    Depression    Diabetes mellitus without complication (HCC)    Type II   DOE (dyspnea on exertion)    Dysrhythmia    PVCs, saw cardiologist Candee Furbish, MD in 2017   Enlarged prostate    Hard of hearing    History of hiatal hernia 2019   History of kidney stones    History of pneumonia    Hypercholesteremia    Hypertension    Nocturia    "controlled with medicines"    No family history on file.  Past Surgical History:  Procedure Laterality Date   BACK SURGERY  2014   lumbar laminectomy   BASAL CELL CARCINOMA EXCISION     left side of face   CARDIAC CATHETERIZATION  05/14/04   normal coronaries, LV systolic function, renals, abdominal aorta (Dr. Myrtice Lauth, [facility])   COLONOSCOPY     DISTAL BICEPS TENDON REPAIR Left 11/14/2015   Procedure: LEFT DISTAL BICEPS TENDON REPAIR;  Surgeon: Leandrew Koyanagi, MD;  Location: [place];  Service: Orthopedics;  Laterality: Left;   TOTAL KNEE ARTHROPLASTY Left 01/08/2016   Procedure: LEFT TOTAL KNEE ARTHROPLASTY;  Surgeon: Leandrew Koyanagi, MD;  Location: [place];  Service: Orthopedics;  Laterality: Left;   TOTAL KNEE ARTHROPLASTY Right  03/23/2021   Procedure: RIGHT TOTAL KNEE ARTHROPLASTY;  Surgeon: Leandrew Koyanagi, MD;  Location: [place];  Service: Orthopedics;  Laterality: Right;   Social History   Occupational History   Not on file  Tobacco Use   Smoking status: Former    Types: Cigarettes    Quit date: 05/31/2004    Years since quitting: 17.0   Smokeless tobacco: Never  Vaping Use   Vaping Use: Never used  Substance and Sexual Activity   Alcohol use: No   Drug use: Yes    Types: Marijuana    Comment: occasional   Sexual activity: Not on file

## 2021-06-27 DIAGNOSIS — J449 Chronic obstructive pulmonary disease, unspecified: Secondary | ICD-10-CM | POA: Diagnosis not present

## 2021-07-09 DIAGNOSIS — Z1331 Encounter for screening for depression: Secondary | ICD-10-CM | POA: Diagnosis not present

## 2021-07-09 DIAGNOSIS — Z9181 History of falling: Secondary | ICD-10-CM | POA: Diagnosis not present

## 2021-07-09 DIAGNOSIS — E785 Hyperlipidemia, unspecified: Secondary | ICD-10-CM | POA: Diagnosis not present

## 2021-07-09 DIAGNOSIS — Z0000 Encounter for general adult medical examination without abnormal findings: Secondary | ICD-10-CM | POA: Diagnosis not present

## 2021-07-28 DIAGNOSIS — J449 Chronic obstructive pulmonary disease, unspecified: Secondary | ICD-10-CM | POA: Diagnosis not present

## 2021-07-30 DIAGNOSIS — M461 Sacroiliitis, not elsewhere classified: Secondary | ICD-10-CM | POA: Diagnosis not present

## 2021-08-24 DIAGNOSIS — E1136 Type 2 diabetes mellitus with diabetic cataract: Secondary | ICD-10-CM | POA: Diagnosis not present

## 2021-08-24 DIAGNOSIS — G8929 Other chronic pain: Secondary | ICD-10-CM | POA: Diagnosis not present

## 2021-08-24 DIAGNOSIS — E1140 Type 2 diabetes mellitus with diabetic neuropathy, unspecified: Secondary | ICD-10-CM | POA: Diagnosis not present

## 2021-08-24 DIAGNOSIS — I252 Old myocardial infarction: Secondary | ICD-10-CM | POA: Diagnosis not present

## 2021-08-24 DIAGNOSIS — E1151 Type 2 diabetes mellitus with diabetic peripheral angiopathy without gangrene: Secondary | ICD-10-CM | POA: Diagnosis not present

## 2021-08-24 DIAGNOSIS — G4733 Obstructive sleep apnea (adult) (pediatric): Secondary | ICD-10-CM | POA: Diagnosis not present

## 2021-08-24 DIAGNOSIS — G4700 Insomnia, unspecified: Secondary | ICD-10-CM | POA: Diagnosis not present

## 2021-08-24 DIAGNOSIS — I2510 Atherosclerotic heart disease of native coronary artery without angina pectoris: Secondary | ICD-10-CM | POA: Diagnosis not present

## 2021-08-24 DIAGNOSIS — I10 Essential (primary) hypertension: Secondary | ICD-10-CM | POA: Diagnosis not present

## 2021-08-24 DIAGNOSIS — Z008 Encounter for other general examination: Secondary | ICD-10-CM | POA: Diagnosis not present

## 2021-08-24 DIAGNOSIS — M461 Sacroiliitis, not elsewhere classified: Secondary | ICD-10-CM | POA: Diagnosis not present

## 2021-08-24 DIAGNOSIS — M353 Polymyalgia rheumatica: Secondary | ICD-10-CM | POA: Diagnosis not present

## 2021-08-24 DIAGNOSIS — J439 Emphysema, unspecified: Secondary | ICD-10-CM | POA: Diagnosis not present

## 2021-08-25 DIAGNOSIS — J449 Chronic obstructive pulmonary disease, unspecified: Secondary | ICD-10-CM | POA: Diagnosis not present

## 2021-08-28 DIAGNOSIS — I10 Essential (primary) hypertension: Secondary | ICD-10-CM | POA: Diagnosis not present

## 2021-08-28 DIAGNOSIS — J449 Chronic obstructive pulmonary disease, unspecified: Secondary | ICD-10-CM | POA: Diagnosis not present

## 2021-08-28 DIAGNOSIS — E1122 Type 2 diabetes mellitus with diabetic chronic kidney disease: Secondary | ICD-10-CM | POA: Diagnosis not present

## 2021-08-28 DIAGNOSIS — N183 Chronic kidney disease, stage 3 unspecified: Secondary | ICD-10-CM | POA: Diagnosis not present

## 2021-08-28 DIAGNOSIS — M179 Osteoarthritis of knee, unspecified: Secondary | ICD-10-CM | POA: Diagnosis not present

## 2021-08-28 DIAGNOSIS — I700 Atherosclerosis of aorta: Secondary | ICD-10-CM | POA: Diagnosis not present

## 2021-08-28 DIAGNOSIS — G4730 Sleep apnea, unspecified: Secondary | ICD-10-CM | POA: Diagnosis not present

## 2021-08-28 DIAGNOSIS — Z6826 Body mass index (BMI) 26.0-26.9, adult: Secondary | ICD-10-CM | POA: Diagnosis not present

## 2021-08-28 DIAGNOSIS — R6889 Other general symptoms and signs: Secondary | ICD-10-CM | POA: Diagnosis not present

## 2021-08-28 DIAGNOSIS — E7800 Pure hypercholesterolemia, unspecified: Secondary | ICD-10-CM | POA: Diagnosis not present

## 2021-09-07 DIAGNOSIS — G894 Chronic pain syndrome: Secondary | ICD-10-CM | POA: Diagnosis not present

## 2021-09-07 DIAGNOSIS — R69 Illness, unspecified: Secondary | ICD-10-CM | POA: Diagnosis not present

## 2021-09-07 DIAGNOSIS — M461 Sacroiliitis, not elsewhere classified: Secondary | ICD-10-CM | POA: Diagnosis not present

## 2021-09-15 ENCOUNTER — Ambulatory Visit: Payer: Medicare HMO | Admitting: Orthopaedic Surgery

## 2021-09-25 DIAGNOSIS — J449 Chronic obstructive pulmonary disease, unspecified: Secondary | ICD-10-CM | POA: Diagnosis not present

## 2021-09-30 DIAGNOSIS — N183 Chronic kidney disease, stage 3 unspecified: Secondary | ICD-10-CM | POA: Diagnosis not present

## 2021-10-25 DIAGNOSIS — J449 Chronic obstructive pulmonary disease, unspecified: Secondary | ICD-10-CM | POA: Diagnosis not present

## 2021-11-02 DIAGNOSIS — M461 Sacroiliitis, not elsewhere classified: Secondary | ICD-10-CM | POA: Diagnosis not present

## 2021-11-25 DIAGNOSIS — J449 Chronic obstructive pulmonary disease, unspecified: Secondary | ICD-10-CM | POA: Diagnosis not present

## 2021-12-18 DIAGNOSIS — J449 Chronic obstructive pulmonary disease, unspecified: Secondary | ICD-10-CM | POA: Diagnosis not present

## 2021-12-18 DIAGNOSIS — R112 Nausea with vomiting, unspecified: Secondary | ICD-10-CM | POA: Diagnosis not present

## 2021-12-18 DIAGNOSIS — I700 Atherosclerosis of aorta: Secondary | ICD-10-CM | POA: Diagnosis not present

## 2021-12-18 DIAGNOSIS — E7800 Pure hypercholesterolemia, unspecified: Secondary | ICD-10-CM | POA: Diagnosis not present

## 2021-12-18 DIAGNOSIS — E1122 Type 2 diabetes mellitus with diabetic chronic kidney disease: Secondary | ICD-10-CM | POA: Diagnosis not present

## 2021-12-18 DIAGNOSIS — N183 Chronic kidney disease, stage 3 unspecified: Secondary | ICD-10-CM | POA: Diagnosis not present

## 2021-12-18 DIAGNOSIS — I10 Essential (primary) hypertension: Secondary | ICD-10-CM | POA: Diagnosis not present

## 2021-12-18 DIAGNOSIS — M179 Osteoarthritis of knee, unspecified: Secondary | ICD-10-CM | POA: Diagnosis not present

## 2021-12-18 DIAGNOSIS — G4730 Sleep apnea, unspecified: Secondary | ICD-10-CM | POA: Diagnosis not present

## 2021-12-21 DIAGNOSIS — M461 Sacroiliitis, not elsewhere classified: Secondary | ICD-10-CM | POA: Diagnosis not present

## 2021-12-21 DIAGNOSIS — R69 Illness, unspecified: Secondary | ICD-10-CM | POA: Diagnosis not present

## 2021-12-21 DIAGNOSIS — G894 Chronic pain syndrome: Secondary | ICD-10-CM | POA: Diagnosis not present

## 2021-12-25 DIAGNOSIS — J449 Chronic obstructive pulmonary disease, unspecified: Secondary | ICD-10-CM | POA: Diagnosis not present

## 2021-12-28 DIAGNOSIS — R112 Nausea with vomiting, unspecified: Secondary | ICD-10-CM | POA: Diagnosis not present

## 2021-12-28 DIAGNOSIS — N281 Cyst of kidney, acquired: Secondary | ICD-10-CM | POA: Diagnosis not present

## 2021-12-28 DIAGNOSIS — K769 Liver disease, unspecified: Secondary | ICD-10-CM | POA: Diagnosis not present

## 2022-01-25 DIAGNOSIS — J449 Chronic obstructive pulmonary disease, unspecified: Secondary | ICD-10-CM | POA: Diagnosis not present

## 2022-02-03 DIAGNOSIS — M461 Sacroiliitis, not elsewhere classified: Secondary | ICD-10-CM | POA: Diagnosis not present

## 2022-02-25 DIAGNOSIS — J449 Chronic obstructive pulmonary disease, unspecified: Secondary | ICD-10-CM | POA: Diagnosis not present

## 2022-03-10 DIAGNOSIS — G894 Chronic pain syndrome: Secondary | ICD-10-CM | POA: Diagnosis not present

## 2022-03-10 DIAGNOSIS — R69 Illness, unspecified: Secondary | ICD-10-CM | POA: Diagnosis not present

## 2022-03-10 DIAGNOSIS — F1120 Opioid dependence, uncomplicated: Secondary | ICD-10-CM | POA: Diagnosis not present

## 2022-03-10 DIAGNOSIS — M5416 Radiculopathy, lumbar region: Secondary | ICD-10-CM | POA: Diagnosis not present

## 2022-03-10 DIAGNOSIS — Z6825 Body mass index (BMI) 25.0-25.9, adult: Secondary | ICD-10-CM | POA: Diagnosis not present

## 2022-03-10 DIAGNOSIS — M461 Sacroiliitis, not elsewhere classified: Secondary | ICD-10-CM | POA: Diagnosis not present

## 2022-03-27 DIAGNOSIS — J449 Chronic obstructive pulmonary disease, unspecified: Secondary | ICD-10-CM | POA: Diagnosis not present

## 2022-04-14 DIAGNOSIS — Z20822 Contact with and (suspected) exposure to covid-19: Secondary | ICD-10-CM | POA: Diagnosis not present

## 2022-04-14 DIAGNOSIS — J441 Chronic obstructive pulmonary disease with (acute) exacerbation: Secondary | ICD-10-CM | POA: Diagnosis not present

## 2022-04-27 DIAGNOSIS — J449 Chronic obstructive pulmonary disease, unspecified: Secondary | ICD-10-CM | POA: Diagnosis not present

## 2022-05-12 DIAGNOSIS — M461 Sacroiliitis, not elsewhere classified: Secondary | ICD-10-CM | POA: Diagnosis not present

## 2022-05-20 DIAGNOSIS — I10 Essential (primary) hypertension: Secondary | ICD-10-CM | POA: Diagnosis not present

## 2022-05-20 DIAGNOSIS — J441 Chronic obstructive pulmonary disease with (acute) exacerbation: Secondary | ICD-10-CM | POA: Diagnosis not present

## 2022-05-20 DIAGNOSIS — Z20828 Contact with and (suspected) exposure to other viral communicable diseases: Secondary | ICD-10-CM | POA: Diagnosis not present

## 2022-05-27 DIAGNOSIS — J449 Chronic obstructive pulmonary disease, unspecified: Secondary | ICD-10-CM | POA: Diagnosis not present

## 2022-06-08 DIAGNOSIS — J449 Chronic obstructive pulmonary disease, unspecified: Secondary | ICD-10-CM | POA: Diagnosis not present

## 2022-06-08 DIAGNOSIS — E7800 Pure hypercholesterolemia, unspecified: Secondary | ICD-10-CM | POA: Diagnosis not present

## 2022-06-08 DIAGNOSIS — I10 Essential (primary) hypertension: Secondary | ICD-10-CM | POA: Diagnosis not present

## 2022-06-08 DIAGNOSIS — N183 Chronic kidney disease, stage 3 unspecified: Secondary | ICD-10-CM | POA: Diagnosis not present

## 2022-06-08 DIAGNOSIS — I493 Ventricular premature depolarization: Secondary | ICD-10-CM | POA: Diagnosis not present

## 2022-06-08 DIAGNOSIS — Z125 Encounter for screening for malignant neoplasm of prostate: Secondary | ICD-10-CM | POA: Diagnosis not present

## 2022-06-08 DIAGNOSIS — R001 Bradycardia, unspecified: Secondary | ICD-10-CM | POA: Diagnosis not present

## 2022-06-08 DIAGNOSIS — M791 Myalgia, unspecified site: Secondary | ICD-10-CM | POA: Diagnosis not present

## 2022-06-08 DIAGNOSIS — I700 Atherosclerosis of aorta: Secondary | ICD-10-CM | POA: Diagnosis not present

## 2022-06-08 DIAGNOSIS — Z139 Encounter for screening, unspecified: Secondary | ICD-10-CM | POA: Diagnosis not present

## 2022-06-08 DIAGNOSIS — E1122 Type 2 diabetes mellitus with diabetic chronic kidney disease: Secondary | ICD-10-CM | POA: Diagnosis not present

## 2022-06-08 DIAGNOSIS — G4730 Sleep apnea, unspecified: Secondary | ICD-10-CM | POA: Diagnosis not present

## 2022-06-08 DIAGNOSIS — T466X5A Adverse effect of antihyperlipidemic and antiarteriosclerotic drugs, initial encounter: Secondary | ICD-10-CM | POA: Diagnosis not present

## 2022-06-09 DIAGNOSIS — M461 Sacroiliitis, not elsewhere classified: Secondary | ICD-10-CM | POA: Diagnosis not present

## 2022-06-09 DIAGNOSIS — G894 Chronic pain syndrome: Secondary | ICD-10-CM | POA: Diagnosis not present

## 2022-06-09 DIAGNOSIS — R69 Illness, unspecified: Secondary | ICD-10-CM | POA: Diagnosis not present

## 2022-06-27 DIAGNOSIS — J449 Chronic obstructive pulmonary disease, unspecified: Secondary | ICD-10-CM | POA: Diagnosis not present

## 2022-07-12 DIAGNOSIS — M549 Dorsalgia, unspecified: Secondary | ICD-10-CM | POA: Diagnosis not present

## 2022-07-12 DIAGNOSIS — N189 Chronic kidney disease, unspecified: Secondary | ICD-10-CM | POA: Diagnosis not present

## 2022-07-12 DIAGNOSIS — R61 Generalized hyperhidrosis: Secondary | ICD-10-CM | POA: Diagnosis not present

## 2022-07-12 DIAGNOSIS — R5383 Other fatigue: Secondary | ICD-10-CM | POA: Diagnosis not present

## 2022-07-12 DIAGNOSIS — R918 Other nonspecific abnormal finding of lung field: Secondary | ICD-10-CM | POA: Diagnosis not present

## 2022-07-12 DIAGNOSIS — Z87891 Personal history of nicotine dependence: Secondary | ICD-10-CM | POA: Diagnosis not present

## 2022-07-12 DIAGNOSIS — G8929 Other chronic pain: Secondary | ICD-10-CM | POA: Diagnosis not present

## 2022-07-12 DIAGNOSIS — R0789 Other chest pain: Secondary | ICD-10-CM | POA: Diagnosis not present

## 2022-07-12 DIAGNOSIS — R1013 Epigastric pain: Secondary | ICD-10-CM | POA: Diagnosis not present

## 2022-07-12 DIAGNOSIS — J449 Chronic obstructive pulmonary disease, unspecified: Secondary | ICD-10-CM | POA: Diagnosis not present

## 2022-07-12 DIAGNOSIS — R110 Nausea: Secondary | ICD-10-CM | POA: Diagnosis not present

## 2022-07-12 DIAGNOSIS — R634 Abnormal weight loss: Secondary | ICD-10-CM | POA: Diagnosis not present

## 2022-07-12 DIAGNOSIS — K5900 Constipation, unspecified: Secondary | ICD-10-CM | POA: Diagnosis not present

## 2022-07-12 DIAGNOSIS — I129 Hypertensive chronic kidney disease with stage 1 through stage 4 chronic kidney disease, or unspecified chronic kidney disease: Secondary | ICD-10-CM | POA: Diagnosis not present

## 2022-07-12 DIAGNOSIS — H539 Unspecified visual disturbance: Secondary | ICD-10-CM | POA: Diagnosis not present

## 2022-07-12 DIAGNOSIS — D649 Anemia, unspecified: Secondary | ICD-10-CM | POA: Diagnosis not present

## 2022-07-12 DIAGNOSIS — E1122 Type 2 diabetes mellitus with diabetic chronic kidney disease: Secondary | ICD-10-CM | POA: Diagnosis not present

## 2022-07-12 DIAGNOSIS — R079 Chest pain, unspecified: Secondary | ICD-10-CM | POA: Diagnosis not present

## 2022-07-12 DIAGNOSIS — I252 Old myocardial infarction: Secondary | ICD-10-CM | POA: Diagnosis not present

## 2022-07-12 DIAGNOSIS — R059 Cough, unspecified: Secondary | ICD-10-CM | POA: Diagnosis not present

## 2022-07-12 DIAGNOSIS — R109 Unspecified abdominal pain: Secondary | ICD-10-CM | POA: Diagnosis not present

## 2022-07-12 DIAGNOSIS — R0602 Shortness of breath: Secondary | ICD-10-CM | POA: Diagnosis not present

## 2022-07-14 DIAGNOSIS — R1013 Epigastric pain: Secondary | ICD-10-CM | POA: Diagnosis not present

## 2022-07-19 DIAGNOSIS — L989 Disorder of the skin and subcutaneous tissue, unspecified: Secondary | ICD-10-CM | POA: Diagnosis not present

## 2022-07-19 DIAGNOSIS — R911 Solitary pulmonary nodule: Secondary | ICD-10-CM | POA: Diagnosis not present

## 2022-07-19 DIAGNOSIS — K5909 Other constipation: Secondary | ICD-10-CM | POA: Diagnosis not present

## 2022-07-19 DIAGNOSIS — H6192 Disorder of left external ear, unspecified: Secondary | ICD-10-CM | POA: Diagnosis not present

## 2022-07-19 DIAGNOSIS — J439 Emphysema, unspecified: Secondary | ICD-10-CM | POA: Diagnosis not present

## 2022-07-19 DIAGNOSIS — R079 Chest pain, unspecified: Secondary | ICD-10-CM | POA: Diagnosis not present

## 2022-07-19 DIAGNOSIS — E1122 Type 2 diabetes mellitus with diabetic chronic kidney disease: Secondary | ICD-10-CM | POA: Diagnosis not present

## 2022-07-28 DIAGNOSIS — J449 Chronic obstructive pulmonary disease, unspecified: Secondary | ICD-10-CM | POA: Diagnosis not present

## 2022-07-29 DIAGNOSIS — L578 Other skin changes due to chronic exposure to nonionizing radiation: Secondary | ICD-10-CM | POA: Diagnosis not present

## 2022-07-29 DIAGNOSIS — C44319 Basal cell carcinoma of skin of other parts of face: Secondary | ICD-10-CM | POA: Diagnosis not present

## 2022-07-29 DIAGNOSIS — C44219 Basal cell carcinoma of skin of left ear and external auricular canal: Secondary | ICD-10-CM | POA: Diagnosis not present

## 2022-08-12 DIAGNOSIS — M461 Sacroiliitis, not elsewhere classified: Secondary | ICD-10-CM | POA: Diagnosis not present

## 2022-08-26 DIAGNOSIS — J449 Chronic obstructive pulmonary disease, unspecified: Secondary | ICD-10-CM | POA: Diagnosis not present

## 2022-09-13 DIAGNOSIS — G894 Chronic pain syndrome: Secondary | ICD-10-CM | POA: Diagnosis not present

## 2022-09-13 DIAGNOSIS — Z6826 Body mass index (BMI) 26.0-26.9, adult: Secondary | ICD-10-CM | POA: Diagnosis not present

## 2022-09-13 DIAGNOSIS — M461 Sacroiliitis, not elsewhere classified: Secondary | ICD-10-CM | POA: Diagnosis not present

## 2022-09-13 DIAGNOSIS — R69 Illness, unspecified: Secondary | ICD-10-CM | POA: Diagnosis not present

## 2022-09-26 DIAGNOSIS — J449 Chronic obstructive pulmonary disease, unspecified: Secondary | ICD-10-CM | POA: Diagnosis not present

## 2022-10-06 DIAGNOSIS — G4733 Obstructive sleep apnea (adult) (pediatric): Secondary | ICD-10-CM | POA: Diagnosis not present

## 2022-10-06 DIAGNOSIS — M1990 Unspecified osteoarthritis, unspecified site: Secondary | ICD-10-CM | POA: Diagnosis not present

## 2022-10-06 DIAGNOSIS — R269 Unspecified abnormalities of gait and mobility: Secondary | ICD-10-CM | POA: Diagnosis not present

## 2022-10-06 DIAGNOSIS — R32 Unspecified urinary incontinence: Secondary | ICD-10-CM | POA: Diagnosis not present

## 2022-10-06 DIAGNOSIS — I252 Old myocardial infarction: Secondary | ICD-10-CM | POA: Diagnosis not present

## 2022-10-06 DIAGNOSIS — M4800 Spinal stenosis, site unspecified: Secondary | ICD-10-CM | POA: Diagnosis not present

## 2022-10-06 DIAGNOSIS — I129 Hypertensive chronic kidney disease with stage 1 through stage 4 chronic kidney disease, or unspecified chronic kidney disease: Secondary | ICD-10-CM | POA: Diagnosis not present

## 2022-10-06 DIAGNOSIS — I2510 Atherosclerotic heart disease of native coronary artery without angina pectoris: Secondary | ICD-10-CM | POA: Diagnosis not present

## 2022-10-06 DIAGNOSIS — N529 Male erectile dysfunction, unspecified: Secondary | ICD-10-CM | POA: Diagnosis not present

## 2022-10-06 DIAGNOSIS — E785 Hyperlipidemia, unspecified: Secondary | ICD-10-CM | POA: Diagnosis not present

## 2022-10-06 DIAGNOSIS — M5430 Sciatica, unspecified side: Secondary | ICD-10-CM | POA: Diagnosis not present

## 2022-10-06 DIAGNOSIS — K5900 Constipation, unspecified: Secondary | ICD-10-CM | POA: Diagnosis not present

## 2022-10-18 DIAGNOSIS — E7800 Pure hypercholesterolemia, unspecified: Secondary | ICD-10-CM | POA: Diagnosis not present

## 2022-10-18 DIAGNOSIS — E1122 Type 2 diabetes mellitus with diabetic chronic kidney disease: Secondary | ICD-10-CM | POA: Diagnosis not present

## 2022-10-18 DIAGNOSIS — N183 Chronic kidney disease, stage 3 unspecified: Secondary | ICD-10-CM | POA: Diagnosis not present

## 2022-10-18 DIAGNOSIS — Z1331 Encounter for screening for depression: Secondary | ICD-10-CM | POA: Diagnosis not present

## 2022-10-18 DIAGNOSIS — I10 Essential (primary) hypertension: Secondary | ICD-10-CM | POA: Diagnosis not present

## 2022-10-18 DIAGNOSIS — I700 Atherosclerosis of aorta: Secondary | ICD-10-CM | POA: Diagnosis not present

## 2022-10-18 DIAGNOSIS — M791 Myalgia, unspecified site: Secondary | ICD-10-CM | POA: Diagnosis not present

## 2022-10-18 DIAGNOSIS — G4730 Sleep apnea, unspecified: Secondary | ICD-10-CM | POA: Diagnosis not present

## 2022-10-18 DIAGNOSIS — Z9181 History of falling: Secondary | ICD-10-CM | POA: Diagnosis not present

## 2022-10-18 DIAGNOSIS — T466X5A Adverse effect of antihyperlipidemic and antiarteriosclerotic drugs, initial encounter: Secondary | ICD-10-CM | POA: Diagnosis not present

## 2022-10-18 DIAGNOSIS — J439 Emphysema, unspecified: Secondary | ICD-10-CM | POA: Diagnosis not present

## 2022-10-18 DIAGNOSIS — R911 Solitary pulmonary nodule: Secondary | ICD-10-CM | POA: Diagnosis not present

## 2022-10-26 DIAGNOSIS — C44311 Basal cell carcinoma of skin of nose: Secondary | ICD-10-CM | POA: Diagnosis not present

## 2022-10-26 DIAGNOSIS — L570 Actinic keratosis: Secondary | ICD-10-CM | POA: Diagnosis not present

## 2022-10-26 DIAGNOSIS — J449 Chronic obstructive pulmonary disease, unspecified: Secondary | ICD-10-CM | POA: Diagnosis not present

## 2022-10-26 DIAGNOSIS — L578 Other skin changes due to chronic exposure to nonionizing radiation: Secondary | ICD-10-CM | POA: Diagnosis not present

## 2022-10-26 DIAGNOSIS — L821 Other seborrheic keratosis: Secondary | ICD-10-CM | POA: Diagnosis not present

## 2022-10-27 DIAGNOSIS — J9809 Other diseases of bronchus, not elsewhere classified: Secondary | ICD-10-CM | POA: Diagnosis not present

## 2022-10-27 DIAGNOSIS — R918 Other nonspecific abnormal finding of lung field: Secondary | ICD-10-CM | POA: Diagnosis not present

## 2022-10-27 DIAGNOSIS — J432 Centrilobular emphysema: Secondary | ICD-10-CM | POA: Diagnosis not present

## 2022-10-27 DIAGNOSIS — R911 Solitary pulmonary nodule: Secondary | ICD-10-CM | POA: Diagnosis not present

## 2022-10-28 DIAGNOSIS — R509 Fever, unspecified: Secondary | ICD-10-CM | POA: Diagnosis not present

## 2022-10-28 DIAGNOSIS — E119 Type 2 diabetes mellitus without complications: Secondary | ICD-10-CM | POA: Diagnosis not present

## 2022-10-28 DIAGNOSIS — R058 Other specified cough: Secondary | ICD-10-CM | POA: Diagnosis not present

## 2022-10-28 DIAGNOSIS — G4700 Insomnia, unspecified: Secondary | ICD-10-CM | POA: Diagnosis not present

## 2022-10-28 DIAGNOSIS — J189 Pneumonia, unspecified organism: Secondary | ICD-10-CM | POA: Diagnosis not present

## 2022-10-28 DIAGNOSIS — Z79891 Long term (current) use of opiate analgesic: Secondary | ICD-10-CM | POA: Diagnosis not present

## 2022-10-28 DIAGNOSIS — R079 Chest pain, unspecified: Secondary | ICD-10-CM | POA: Diagnosis not present

## 2022-10-28 DIAGNOSIS — Z79899 Other long term (current) drug therapy: Secondary | ICD-10-CM | POA: Diagnosis not present

## 2022-10-28 DIAGNOSIS — N183 Chronic kidney disease, stage 3 unspecified: Secondary | ICD-10-CM | POA: Diagnosis not present

## 2022-10-28 DIAGNOSIS — J441 Chronic obstructive pulmonary disease with (acute) exacerbation: Secondary | ICD-10-CM | POA: Diagnosis not present

## 2022-10-28 DIAGNOSIS — Z87891 Personal history of nicotine dependence: Secondary | ICD-10-CM | POA: Diagnosis not present

## 2022-10-28 DIAGNOSIS — I10 Essential (primary) hypertension: Secondary | ICD-10-CM | POA: Diagnosis not present

## 2022-10-28 DIAGNOSIS — G8929 Other chronic pain: Secondary | ICD-10-CM | POA: Diagnosis not present

## 2022-10-28 DIAGNOSIS — I129 Hypertensive chronic kidney disease with stage 1 through stage 4 chronic kidney disease, or unspecified chronic kidney disease: Secondary | ICD-10-CM | POA: Diagnosis not present

## 2022-10-28 DIAGNOSIS — R911 Solitary pulmonary nodule: Secondary | ICD-10-CM | POA: Diagnosis not present

## 2022-10-28 DIAGNOSIS — R918 Other nonspecific abnormal finding of lung field: Secondary | ICD-10-CM | POA: Diagnosis not present

## 2022-10-28 DIAGNOSIS — R112 Nausea with vomiting, unspecified: Secondary | ICD-10-CM | POA: Diagnosis not present

## 2022-10-28 DIAGNOSIS — Z7951 Long term (current) use of inhaled steroids: Secondary | ICD-10-CM | POA: Diagnosis not present

## 2022-10-28 DIAGNOSIS — E1122 Type 2 diabetes mellitus with diabetic chronic kidney disease: Secondary | ICD-10-CM | POA: Diagnosis not present

## 2022-10-28 DIAGNOSIS — J449 Chronic obstructive pulmonary disease, unspecified: Secondary | ICD-10-CM | POA: Diagnosis not present

## 2022-10-28 DIAGNOSIS — N400 Enlarged prostate without lower urinary tract symptoms: Secondary | ICD-10-CM | POA: Diagnosis not present

## 2022-10-28 DIAGNOSIS — M549 Dorsalgia, unspecified: Secondary | ICD-10-CM | POA: Diagnosis not present

## 2022-10-29 DIAGNOSIS — I129 Hypertensive chronic kidney disease with stage 1 through stage 4 chronic kidney disease, or unspecified chronic kidney disease: Secondary | ICD-10-CM | POA: Diagnosis not present

## 2022-10-29 DIAGNOSIS — Z79899 Other long term (current) drug therapy: Secondary | ICD-10-CM | POA: Diagnosis not present

## 2022-10-29 DIAGNOSIS — E1122 Type 2 diabetes mellitus with diabetic chronic kidney disease: Secondary | ICD-10-CM | POA: Diagnosis not present

## 2022-10-29 DIAGNOSIS — M549 Dorsalgia, unspecified: Secondary | ICD-10-CM | POA: Diagnosis not present

## 2022-10-29 DIAGNOSIS — R112 Nausea with vomiting, unspecified: Secondary | ICD-10-CM | POA: Diagnosis not present

## 2022-10-29 DIAGNOSIS — J189 Pneumonia, unspecified organism: Secondary | ICD-10-CM | POA: Diagnosis not present

## 2022-10-29 DIAGNOSIS — J441 Chronic obstructive pulmonary disease with (acute) exacerbation: Secondary | ICD-10-CM | POA: Diagnosis not present

## 2022-10-29 DIAGNOSIS — G8929 Other chronic pain: Secondary | ICD-10-CM | POA: Diagnosis not present

## 2022-10-29 DIAGNOSIS — N183 Chronic kidney disease, stage 3 unspecified: Secondary | ICD-10-CM | POA: Diagnosis not present

## 2022-10-31 DIAGNOSIS — J449 Chronic obstructive pulmonary disease, unspecified: Secondary | ICD-10-CM | POA: Diagnosis not present

## 2022-11-03 DIAGNOSIS — J189 Pneumonia, unspecified organism: Secondary | ICD-10-CM | POA: Diagnosis not present

## 2022-11-03 DIAGNOSIS — R911 Solitary pulmonary nodule: Secondary | ICD-10-CM | POA: Diagnosis not present

## 2022-11-03 DIAGNOSIS — J441 Chronic obstructive pulmonary disease with (acute) exacerbation: Secondary | ICD-10-CM | POA: Diagnosis not present

## 2022-11-15 DIAGNOSIS — M461 Sacroiliitis, not elsewhere classified: Secondary | ICD-10-CM | POA: Diagnosis not present

## 2022-11-26 DIAGNOSIS — J449 Chronic obstructive pulmonary disease, unspecified: Secondary | ICD-10-CM | POA: Diagnosis not present

## 2022-12-15 ENCOUNTER — Telehealth: Payer: Self-pay

## 2022-12-15 NOTE — Patient Outreach (Signed)
  Care Coordination   12/15/2022 Name: Chris Washington MRN: [id number] DOB: [birth year]   Care Coordination Outreach Attempts:  An unsuccessful telephone outreach was attempted today to offer the patient information about available care coordination services.  Follow Up Plan:  Additional outreach attempts will be made to offer the patient care coordination information and services.   Encounter Outcome:  No Answer   Care Coordination Interventions:  No, not indicated    Rowe Pavy, RN, BSN, [facility] [employer] [facility] [phone]

## 2022-12-26 DIAGNOSIS — J449 Chronic obstructive pulmonary disease, unspecified: Secondary | ICD-10-CM | POA: Diagnosis not present

## 2023-01-25 DIAGNOSIS — F1120 Opioid dependence, uncomplicated: Secondary | ICD-10-CM | POA: Diagnosis not present

## 2023-01-25 DIAGNOSIS — G894 Chronic pain syndrome: Secondary | ICD-10-CM | POA: Diagnosis not present

## 2023-01-25 DIAGNOSIS — M461 Sacroiliitis, not elsewhere classified: Secondary | ICD-10-CM | POA: Diagnosis not present

## 2023-01-26 DIAGNOSIS — J441 Chronic obstructive pulmonary disease with (acute) exacerbation: Secondary | ICD-10-CM | POA: Diagnosis not present

## 2023-01-26 DIAGNOSIS — I10 Essential (primary) hypertension: Secondary | ICD-10-CM | POA: Diagnosis not present

## 2023-01-26 DIAGNOSIS — R911 Solitary pulmonary nodule: Secondary | ICD-10-CM | POA: Diagnosis not present

## 2023-01-26 DIAGNOSIS — G4700 Insomnia, unspecified: Secondary | ICD-10-CM | POA: Diagnosis not present

## 2023-01-26 DIAGNOSIS — I700 Atherosclerosis of aorta: Secondary | ICD-10-CM | POA: Diagnosis not present

## 2023-01-26 DIAGNOSIS — E1122 Type 2 diabetes mellitus with diabetic chronic kidney disease: Secondary | ICD-10-CM | POA: Diagnosis not present

## 2023-01-26 DIAGNOSIS — T466X5A Adverse effect of antihyperlipidemic and antiarteriosclerotic drugs, initial encounter: Secondary | ICD-10-CM | POA: Diagnosis not present

## 2023-01-26 DIAGNOSIS — M791 Myalgia, unspecified site: Secondary | ICD-10-CM | POA: Diagnosis not present

## 2023-01-26 DIAGNOSIS — G4730 Sleep apnea, unspecified: Secondary | ICD-10-CM | POA: Diagnosis not present

## 2023-01-26 DIAGNOSIS — J449 Chronic obstructive pulmonary disease, unspecified: Secondary | ICD-10-CM | POA: Diagnosis not present

## 2023-01-26 DIAGNOSIS — E7800 Pure hypercholesterolemia, unspecified: Secondary | ICD-10-CM | POA: Diagnosis not present

## 2023-01-26 DIAGNOSIS — N183 Chronic kidney disease, stage 3 unspecified: Secondary | ICD-10-CM | POA: Diagnosis not present

## 2023-02-08 DIAGNOSIS — J432 Centrilobular emphysema: Secondary | ICD-10-CM | POA: Diagnosis not present

## 2023-02-08 DIAGNOSIS — I2510 Atherosclerotic heart disease of native coronary artery without angina pectoris: Secondary | ICD-10-CM | POA: Diagnosis not present

## 2023-02-08 DIAGNOSIS — J9809 Other diseases of bronchus, not elsewhere classified: Secondary | ICD-10-CM | POA: Diagnosis not present

## 2023-02-08 DIAGNOSIS — R911 Solitary pulmonary nodule: Secondary | ICD-10-CM | POA: Diagnosis not present

## 2023-02-16 DIAGNOSIS — J441 Chronic obstructive pulmonary disease with (acute) exacerbation: Secondary | ICD-10-CM | POA: Diagnosis not present

## 2023-02-16 DIAGNOSIS — R1110 Vomiting, unspecified: Secondary | ICD-10-CM | POA: Diagnosis not present

## 2023-02-16 DIAGNOSIS — K219 Gastro-esophageal reflux disease without esophagitis: Secondary | ICD-10-CM | POA: Diagnosis not present

## 2023-02-21 DIAGNOSIS — M461 Sacroiliitis, not elsewhere classified: Secondary | ICD-10-CM | POA: Diagnosis not present

## 2023-02-26 DIAGNOSIS — J449 Chronic obstructive pulmonary disease, unspecified: Secondary | ICD-10-CM | POA: Diagnosis not present

## 2023-03-07 DIAGNOSIS — J439 Emphysema, unspecified: Secondary | ICD-10-CM | POA: Diagnosis not present

## 2023-03-07 DIAGNOSIS — K219 Gastro-esophageal reflux disease without esophagitis: Secondary | ICD-10-CM | POA: Diagnosis not present

## 2023-03-07 DIAGNOSIS — S46211A Strain of muscle, fascia and tendon of other parts of biceps, right arm, initial encounter: Secondary | ICD-10-CM | POA: Diagnosis not present

## 2023-03-07 DIAGNOSIS — R1110 Vomiting, unspecified: Secondary | ICD-10-CM | POA: Diagnosis not present

## 2023-03-28 DIAGNOSIS — J449 Chronic obstructive pulmonary disease, unspecified: Secondary | ICD-10-CM | POA: Diagnosis not present

## 2023-03-29 DIAGNOSIS — Z0000 Encounter for general adult medical examination without abnormal findings: Secondary | ICD-10-CM | POA: Diagnosis not present

## 2023-03-29 DIAGNOSIS — Z1331 Encounter for screening for depression: Secondary | ICD-10-CM | POA: Diagnosis not present

## 2023-03-29 DIAGNOSIS — Z9181 History of falling: Secondary | ICD-10-CM | POA: Diagnosis not present

## 2023-03-29 DIAGNOSIS — Z139 Encounter for screening, unspecified: Secondary | ICD-10-CM | POA: Diagnosis not present

## 2023-04-06 DIAGNOSIS — J449 Chronic obstructive pulmonary disease, unspecified: Secondary | ICD-10-CM | POA: Diagnosis not present

## 2023-04-06 DIAGNOSIS — I10 Essential (primary) hypertension: Secondary | ICD-10-CM | POA: Diagnosis not present

## 2023-04-06 DIAGNOSIS — N390 Urinary tract infection, site not specified: Secondary | ICD-10-CM | POA: Diagnosis not present

## 2023-04-06 DIAGNOSIS — I252 Old myocardial infarction: Secondary | ICD-10-CM | POA: Diagnosis not present

## 2023-04-06 DIAGNOSIS — R109 Unspecified abdominal pain: Secondary | ICD-10-CM | POA: Diagnosis not present

## 2023-04-06 DIAGNOSIS — Z79899 Other long term (current) drug therapy: Secondary | ICD-10-CM | POA: Diagnosis not present

## 2023-04-06 DIAGNOSIS — Z87891 Personal history of nicotine dependence: Secondary | ICD-10-CM | POA: Diagnosis not present

## 2023-04-06 DIAGNOSIS — Z792 Long term (current) use of antibiotics: Secondary | ICD-10-CM | POA: Diagnosis not present

## 2023-04-06 DIAGNOSIS — Z1152 Encounter for screening for COVID-19: Secondary | ICD-10-CM | POA: Diagnosis not present

## 2023-04-06 DIAGNOSIS — E119 Type 2 diabetes mellitus without complications: Secondary | ICD-10-CM | POA: Diagnosis not present

## 2023-04-06 DIAGNOSIS — R0789 Other chest pain: Secondary | ICD-10-CM | POA: Diagnosis not present

## 2023-04-06 DIAGNOSIS — R079 Chest pain, unspecified: Secondary | ICD-10-CM | POA: Diagnosis not present

## 2023-04-19 DIAGNOSIS — L578 Other skin changes due to chronic exposure to nonionizing radiation: Secondary | ICD-10-CM | POA: Diagnosis not present

## 2023-04-19 DIAGNOSIS — D485 Neoplasm of uncertain behavior of skin: Secondary | ICD-10-CM | POA: Diagnosis not present

## 2023-04-19 DIAGNOSIS — L309 Dermatitis, unspecified: Secondary | ICD-10-CM | POA: Diagnosis not present

## 2023-04-19 DIAGNOSIS — L300 Nummular dermatitis: Secondary | ICD-10-CM | POA: Diagnosis not present

## 2023-04-28 DIAGNOSIS — J449 Chronic obstructive pulmonary disease, unspecified: Secondary | ICD-10-CM | POA: Diagnosis not present

## 2023-04-28 DIAGNOSIS — R079 Chest pain, unspecified: Secondary | ICD-10-CM | POA: Diagnosis not present

## 2023-05-02 DIAGNOSIS — G894 Chronic pain syndrome: Secondary | ICD-10-CM | POA: Diagnosis not present

## 2023-05-02 DIAGNOSIS — F1120 Opioid dependence, uncomplicated: Secondary | ICD-10-CM | POA: Diagnosis not present

## 2023-05-02 DIAGNOSIS — M461 Sacroiliitis, not elsewhere classified: Secondary | ICD-10-CM | POA: Diagnosis not present

## 2023-05-03 DIAGNOSIS — T466X5A Adverse effect of antihyperlipidemic and antiarteriosclerotic drugs, initial encounter: Secondary | ICD-10-CM | POA: Diagnosis not present

## 2023-05-03 DIAGNOSIS — I700 Atherosclerosis of aorta: Secondary | ICD-10-CM | POA: Diagnosis not present

## 2023-05-03 DIAGNOSIS — M545 Low back pain, unspecified: Secondary | ICD-10-CM | POA: Diagnosis not present

## 2023-05-03 DIAGNOSIS — E1122 Type 2 diabetes mellitus with diabetic chronic kidney disease: Secondary | ICD-10-CM | POA: Diagnosis not present

## 2023-05-03 DIAGNOSIS — J439 Emphysema, unspecified: Secondary | ICD-10-CM | POA: Diagnosis not present

## 2023-05-03 DIAGNOSIS — G4730 Sleep apnea, unspecified: Secondary | ICD-10-CM | POA: Diagnosis not present

## 2023-05-03 DIAGNOSIS — I10 Essential (primary) hypertension: Secondary | ICD-10-CM | POA: Diagnosis not present

## 2023-05-03 DIAGNOSIS — N183 Chronic kidney disease, stage 3 unspecified: Secondary | ICD-10-CM | POA: Diagnosis not present

## 2023-05-03 DIAGNOSIS — E7800 Pure hypercholesterolemia, unspecified: Secondary | ICD-10-CM | POA: Diagnosis not present

## 2023-05-03 DIAGNOSIS — M791 Myalgia, unspecified site: Secondary | ICD-10-CM | POA: Diagnosis not present

## 2023-05-17 DIAGNOSIS — H52223 Regular astigmatism, bilateral: Secondary | ICD-10-CM | POA: Diagnosis not present

## 2023-05-17 DIAGNOSIS — H5203 Hypermetropia, bilateral: Secondary | ICD-10-CM | POA: Diagnosis not present

## 2023-05-17 DIAGNOSIS — H26493 Other secondary cataract, bilateral: Secondary | ICD-10-CM | POA: Diagnosis not present

## 2023-05-17 DIAGNOSIS — H524 Presbyopia: Secondary | ICD-10-CM | POA: Diagnosis not present

## 2023-05-26 DIAGNOSIS — M461 Sacroiliitis, not elsewhere classified: Secondary | ICD-10-CM | POA: Diagnosis not present

## 2023-05-28 DIAGNOSIS — J449 Chronic obstructive pulmonary disease, unspecified: Secondary | ICD-10-CM | POA: Diagnosis not present

## 2023-06-28 DIAGNOSIS — J449 Chronic obstructive pulmonary disease, unspecified: Secondary | ICD-10-CM | POA: Diagnosis not present

## 2023-06-30 DIAGNOSIS — H26492 Other secondary cataract, left eye: Secondary | ICD-10-CM | POA: Diagnosis not present

## 2023-06-30 DIAGNOSIS — Z961 Presence of intraocular lens: Secondary | ICD-10-CM | POA: Diagnosis not present

## 2023-07-22 DIAGNOSIS — R634 Abnormal weight loss: Secondary | ICD-10-CM | POA: Diagnosis not present

## 2023-07-22 DIAGNOSIS — R21 Rash and other nonspecific skin eruption: Secondary | ICD-10-CM | POA: Diagnosis not present

## 2023-07-22 DIAGNOSIS — Z860100 Personal history of colon polyps, unspecified: Secondary | ICD-10-CM | POA: Diagnosis not present

## 2023-07-22 DIAGNOSIS — R109 Unspecified abdominal pain: Secondary | ICD-10-CM | POA: Diagnosis not present

## 2023-07-25 DIAGNOSIS — L309 Dermatitis, unspecified: Secondary | ICD-10-CM | POA: Diagnosis not present

## 2023-07-25 DIAGNOSIS — L299 Pruritus, unspecified: Secondary | ICD-10-CM | POA: Diagnosis not present

## 2023-07-25 DIAGNOSIS — L300 Nummular dermatitis: Secondary | ICD-10-CM | POA: Diagnosis not present

## 2023-07-25 DIAGNOSIS — D485 Neoplasm of uncertain behavior of skin: Secondary | ICD-10-CM | POA: Diagnosis not present

## 2023-07-28 DIAGNOSIS — L280 Lichen simplex chronicus: Secondary | ICD-10-CM | POA: Diagnosis not present

## 2023-07-28 DIAGNOSIS — L930 Discoid lupus erythematosus: Secondary | ICD-10-CM | POA: Diagnosis not present

## 2023-07-28 DIAGNOSIS — R531 Weakness: Secondary | ICD-10-CM | POA: Diagnosis not present

## 2023-07-29 DIAGNOSIS — J449 Chronic obstructive pulmonary disease, unspecified: Secondary | ICD-10-CM | POA: Diagnosis not present

## 2023-08-08 DIAGNOSIS — G894 Chronic pain syndrome: Secondary | ICD-10-CM | POA: Diagnosis not present

## 2023-08-08 DIAGNOSIS — Z6826 Body mass index (BMI) 26.0-26.9, adult: Secondary | ICD-10-CM | POA: Diagnosis not present

## 2023-08-08 DIAGNOSIS — F1120 Opioid dependence, uncomplicated: Secondary | ICD-10-CM | POA: Diagnosis not present

## 2023-08-08 DIAGNOSIS — M5416 Radiculopathy, lumbar region: Secondary | ICD-10-CM | POA: Diagnosis not present

## 2023-08-08 DIAGNOSIS — M461 Sacroiliitis, not elsewhere classified: Secondary | ICD-10-CM | POA: Diagnosis not present

## 2023-08-25 DIAGNOSIS — L209 Atopic dermatitis, unspecified: Secondary | ICD-10-CM | POA: Diagnosis not present

## 2023-08-26 DIAGNOSIS — J449 Chronic obstructive pulmonary disease, unspecified: Secondary | ICD-10-CM | POA: Diagnosis not present

## 2023-09-01 DIAGNOSIS — M461 Sacroiliitis, not elsewhere classified: Secondary | ICD-10-CM | POA: Diagnosis not present

## 2023-09-08 DIAGNOSIS — L300 Nummular dermatitis: Secondary | ICD-10-CM | POA: Diagnosis not present

## 2023-09-08 DIAGNOSIS — L299 Pruritus, unspecified: Secondary | ICD-10-CM | POA: Diagnosis not present

## 2023-09-26 DIAGNOSIS — J449 Chronic obstructive pulmonary disease, unspecified: Secondary | ICD-10-CM | POA: Diagnosis not present

## 2023-09-27 DIAGNOSIS — G4730 Sleep apnea, unspecified: Secondary | ICD-10-CM | POA: Diagnosis not present

## 2023-09-27 DIAGNOSIS — K219 Gastro-esophageal reflux disease without esophagitis: Secondary | ICD-10-CM | POA: Diagnosis not present

## 2023-09-27 DIAGNOSIS — M791 Myalgia, unspecified site: Secondary | ICD-10-CM | POA: Diagnosis not present

## 2023-09-27 DIAGNOSIS — E1122 Type 2 diabetes mellitus with diabetic chronic kidney disease: Secondary | ICD-10-CM | POA: Diagnosis not present

## 2023-09-27 DIAGNOSIS — N183 Chronic kidney disease, stage 3 unspecified: Secondary | ICD-10-CM | POA: Diagnosis not present

## 2023-09-27 DIAGNOSIS — T466X5A Adverse effect of antihyperlipidemic and antiarteriosclerotic drugs, initial encounter: Secondary | ICD-10-CM | POA: Diagnosis not present

## 2023-09-27 DIAGNOSIS — I10 Essential (primary) hypertension: Secondary | ICD-10-CM | POA: Diagnosis not present

## 2023-09-27 DIAGNOSIS — Z79899 Other long term (current) drug therapy: Secondary | ICD-10-CM | POA: Diagnosis not present

## 2023-09-27 DIAGNOSIS — M545 Low back pain, unspecified: Secondary | ICD-10-CM | POA: Diagnosis not present

## 2023-09-27 DIAGNOSIS — J439 Emphysema, unspecified: Secondary | ICD-10-CM | POA: Diagnosis not present

## 2023-09-27 DIAGNOSIS — E7800 Pure hypercholesterolemia, unspecified: Secondary | ICD-10-CM | POA: Diagnosis not present

## 2023-10-26 DIAGNOSIS — J449 Chronic obstructive pulmonary disease, unspecified: Secondary | ICD-10-CM | POA: Diagnosis not present

## 2023-11-01 DIAGNOSIS — M461 Sacroiliitis, not elsewhere classified: Secondary | ICD-10-CM | POA: Diagnosis not present

## 2023-11-01 DIAGNOSIS — F1120 Opioid dependence, uncomplicated: Secondary | ICD-10-CM | POA: Diagnosis not present

## 2023-11-01 DIAGNOSIS — G894 Chronic pain syndrome: Secondary | ICD-10-CM | POA: Diagnosis not present

## 2023-11-02 DIAGNOSIS — R1084 Generalized abdominal pain: Secondary | ICD-10-CM | POA: Diagnosis not present

## 2023-11-02 DIAGNOSIS — K5939 Other megacolon: Secondary | ICD-10-CM | POA: Diagnosis not present

## 2023-11-02 DIAGNOSIS — R109 Unspecified abdominal pain: Secondary | ICD-10-CM | POA: Diagnosis not present

## 2023-11-02 DIAGNOSIS — R1110 Vomiting, unspecified: Secondary | ICD-10-CM | POA: Diagnosis not present

## 2023-11-02 DIAGNOSIS — R634 Abnormal weight loss: Secondary | ICD-10-CM | POA: Diagnosis not present

## 2023-11-02 DIAGNOSIS — R140 Abdominal distension (gaseous): Secondary | ICD-10-CM | POA: Diagnosis not present

## 2023-11-06 DIAGNOSIS — N1832 Chronic kidney disease, stage 3b: Secondary | ICD-10-CM | POA: Diagnosis not present

## 2023-11-06 DIAGNOSIS — I130 Hypertensive heart and chronic kidney disease with heart failure and stage 1 through stage 4 chronic kidney disease, or unspecified chronic kidney disease: Secondary | ICD-10-CM | POA: Diagnosis not present

## 2023-11-06 DIAGNOSIS — K219 Gastro-esophageal reflux disease without esophagitis: Secondary | ICD-10-CM | POA: Diagnosis not present

## 2023-11-06 DIAGNOSIS — M1990 Unspecified osteoarthritis, unspecified site: Secondary | ICD-10-CM | POA: Diagnosis not present

## 2023-11-06 DIAGNOSIS — M461 Sacroiliitis, not elsewhere classified: Secondary | ICD-10-CM | POA: Diagnosis not present

## 2023-11-06 DIAGNOSIS — M0550 Rheumatoid polyneuropathy with rheumatoid arthritis of unspecified site: Secondary | ICD-10-CM | POA: Diagnosis not present

## 2023-11-06 DIAGNOSIS — M353 Polymyalgia rheumatica: Secondary | ICD-10-CM | POA: Diagnosis not present

## 2023-11-06 DIAGNOSIS — Z87891 Personal history of nicotine dependence: Secondary | ICD-10-CM | POA: Diagnosis not present

## 2023-11-06 DIAGNOSIS — I509 Heart failure, unspecified: Secondary | ICD-10-CM | POA: Diagnosis not present

## 2023-11-06 DIAGNOSIS — F1120 Opioid dependence, uncomplicated: Secondary | ICD-10-CM | POA: Diagnosis not present

## 2023-11-06 DIAGNOSIS — Z008 Encounter for other general examination: Secondary | ICD-10-CM | POA: Diagnosis not present

## 2023-11-06 DIAGNOSIS — J439 Emphysema, unspecified: Secondary | ICD-10-CM | POA: Diagnosis not present

## 2023-11-06 DIAGNOSIS — I700 Atherosclerosis of aorta: Secondary | ICD-10-CM | POA: Diagnosis not present

## 2023-11-09 DIAGNOSIS — R109 Unspecified abdominal pain: Secondary | ICD-10-CM | POA: Diagnosis not present

## 2023-11-26 DIAGNOSIS — J449 Chronic obstructive pulmonary disease, unspecified: Secondary | ICD-10-CM | POA: Diagnosis not present

## 2023-12-06 DIAGNOSIS — K5900 Constipation, unspecified: Secondary | ICD-10-CM | POA: Diagnosis not present

## 2023-12-06 DIAGNOSIS — K219 Gastro-esophageal reflux disease without esophagitis: Secondary | ICD-10-CM | POA: Diagnosis not present

## 2023-12-06 DIAGNOSIS — R109 Unspecified abdominal pain: Secondary | ICD-10-CM | POA: Diagnosis not present

## 2023-12-26 DIAGNOSIS — J449 Chronic obstructive pulmonary disease, unspecified: Secondary | ICD-10-CM | POA: Diagnosis not present

## 2024-01-03 DIAGNOSIS — J439 Emphysema, unspecified: Secondary | ICD-10-CM | POA: Diagnosis not present

## 2024-01-03 DIAGNOSIS — K219 Gastro-esophageal reflux disease without esophagitis: Secondary | ICD-10-CM | POA: Diagnosis not present

## 2024-01-03 DIAGNOSIS — I10 Essential (primary) hypertension: Secondary | ICD-10-CM | POA: Diagnosis not present

## 2024-01-03 DIAGNOSIS — E7800 Pure hypercholesterolemia, unspecified: Secondary | ICD-10-CM | POA: Diagnosis not present

## 2024-01-03 DIAGNOSIS — E1122 Type 2 diabetes mellitus with diabetic chronic kidney disease: Secondary | ICD-10-CM | POA: Diagnosis not present

## 2024-01-03 DIAGNOSIS — G4730 Sleep apnea, unspecified: Secondary | ICD-10-CM | POA: Diagnosis not present

## 2024-01-03 DIAGNOSIS — M791 Myalgia, unspecified site: Secondary | ICD-10-CM | POA: Diagnosis not present

## 2024-01-03 DIAGNOSIS — R609 Edema, unspecified: Secondary | ICD-10-CM | POA: Diagnosis not present

## 2024-01-03 DIAGNOSIS — T466X5A Adverse effect of antihyperlipidemic and antiarteriosclerotic drugs, initial encounter: Secondary | ICD-10-CM | POA: Diagnosis not present

## 2024-01-03 DIAGNOSIS — N183 Chronic kidney disease, stage 3 unspecified: Secondary | ICD-10-CM | POA: Diagnosis not present

## 2024-01-03 DIAGNOSIS — J441 Chronic obstructive pulmonary disease with (acute) exacerbation: Secondary | ICD-10-CM | POA: Diagnosis not present

## 2024-01-03 DIAGNOSIS — M545 Low back pain, unspecified: Secondary | ICD-10-CM | POA: Diagnosis not present

## 2024-01-17 DIAGNOSIS — R609 Edema, unspecified: Secondary | ICD-10-CM | POA: Diagnosis not present

## 2024-01-17 DIAGNOSIS — I10 Essential (primary) hypertension: Secondary | ICD-10-CM | POA: Diagnosis not present

## 2024-01-17 DIAGNOSIS — I509 Heart failure, unspecified: Secondary | ICD-10-CM | POA: Diagnosis not present

## 2024-01-17 DIAGNOSIS — I493 Ventricular premature depolarization: Secondary | ICD-10-CM | POA: Diagnosis not present

## 2024-01-26 DIAGNOSIS — J449 Chronic obstructive pulmonary disease, unspecified: Secondary | ICD-10-CM | POA: Diagnosis not present

## 2024-01-27 DIAGNOSIS — I509 Heart failure, unspecified: Secondary | ICD-10-CM | POA: Diagnosis not present

## 2024-01-27 DIAGNOSIS — I10 Essential (primary) hypertension: Secondary | ICD-10-CM | POA: Diagnosis not present

## 2024-01-27 DIAGNOSIS — R251 Tremor, unspecified: Secondary | ICD-10-CM | POA: Diagnosis not present

## 2024-01-27 DIAGNOSIS — R609 Edema, unspecified: Secondary | ICD-10-CM | POA: Diagnosis not present

## 2024-02-07 DIAGNOSIS — G894 Chronic pain syndrome: Secondary | ICD-10-CM | POA: Diagnosis not present

## 2024-02-14 DIAGNOSIS — R918 Other nonspecific abnormal finding of lung field: Secondary | ICD-10-CM | POA: Diagnosis not present

## 2024-02-14 DIAGNOSIS — F1120 Opioid dependence, uncomplicated: Secondary | ICD-10-CM | POA: Diagnosis not present

## 2024-02-14 DIAGNOSIS — J449 Chronic obstructive pulmonary disease, unspecified: Secondary | ICD-10-CM | POA: Diagnosis not present

## 2024-02-14 DIAGNOSIS — R7989 Other specified abnormal findings of blood chemistry: Secondary | ICD-10-CM | POA: Diagnosis not present

## 2024-02-14 DIAGNOSIS — N189 Chronic kidney disease, unspecified: Secondary | ICD-10-CM | POA: Diagnosis not present

## 2024-02-14 DIAGNOSIS — D72829 Elevated white blood cell count, unspecified: Secondary | ICD-10-CM | POA: Diagnosis not present

## 2024-02-14 DIAGNOSIS — I129 Hypertensive chronic kidney disease with stage 1 through stage 4 chronic kidney disease, or unspecified chronic kidney disease: Secondary | ICD-10-CM | POA: Diagnosis not present

## 2024-02-14 DIAGNOSIS — E1122 Type 2 diabetes mellitus with diabetic chronic kidney disease: Secondary | ICD-10-CM | POA: Diagnosis not present

## 2024-02-14 DIAGNOSIS — I252 Old myocardial infarction: Secondary | ICD-10-CM | POA: Diagnosis not present

## 2024-02-14 DIAGNOSIS — Z87891 Personal history of nicotine dependence: Secondary | ICD-10-CM | POA: Diagnosis not present

## 2024-02-14 DIAGNOSIS — R079 Chest pain, unspecified: Secondary | ICD-10-CM | POA: Diagnosis not present

## 2024-02-14 DIAGNOSIS — M311 Thrombotic microangiopathy, unspecified: Secondary | ICD-10-CM | POA: Diagnosis not present

## 2024-02-14 DIAGNOSIS — R109 Unspecified abdominal pain: Secondary | ICD-10-CM | POA: Diagnosis not present

## 2024-02-14 DIAGNOSIS — I2510 Atherosclerotic heart disease of native coronary artery without angina pectoris: Secondary | ICD-10-CM | POA: Diagnosis not present

## 2024-02-15 DIAGNOSIS — Z1152 Encounter for screening for COVID-19: Secondary | ICD-10-CM | POA: Diagnosis not present

## 2024-02-15 DIAGNOSIS — I428 Other cardiomyopathies: Secondary | ICD-10-CM | POA: Diagnosis not present

## 2024-02-15 DIAGNOSIS — N400 Enlarged prostate without lower urinary tract symptoms: Secondary | ICD-10-CM | POA: Diagnosis not present

## 2024-02-15 DIAGNOSIS — Z9981 Dependence on supplemental oxygen: Secondary | ICD-10-CM | POA: Diagnosis not present

## 2024-02-15 DIAGNOSIS — I517 Cardiomegaly: Secondary | ICD-10-CM | POA: Diagnosis not present

## 2024-02-15 DIAGNOSIS — R0600 Dyspnea, unspecified: Secondary | ICD-10-CM | POA: Diagnosis not present

## 2024-02-15 DIAGNOSIS — I129 Hypertensive chronic kidney disease with stage 1 through stage 4 chronic kidney disease, or unspecified chronic kidney disease: Secondary | ICD-10-CM | POA: Diagnosis not present

## 2024-02-15 DIAGNOSIS — E785 Hyperlipidemia, unspecified: Secondary | ICD-10-CM | POA: Diagnosis not present

## 2024-02-15 DIAGNOSIS — G8929 Other chronic pain: Secondary | ICD-10-CM | POA: Diagnosis not present

## 2024-02-15 DIAGNOSIS — I214 Non-ST elevation (NSTEMI) myocardial infarction: Secondary | ICD-10-CM | POA: Diagnosis not present

## 2024-02-15 DIAGNOSIS — I25110 Atherosclerotic heart disease of native coronary artery with unstable angina pectoris: Secondary | ICD-10-CM | POA: Diagnosis not present

## 2024-02-15 DIAGNOSIS — I429 Cardiomyopathy, unspecified: Secondary | ICD-10-CM | POA: Diagnosis not present

## 2024-02-15 DIAGNOSIS — J449 Chronic obstructive pulmonary disease, unspecified: Secondary | ICD-10-CM | POA: Diagnosis not present

## 2024-02-15 DIAGNOSIS — I4719 Other supraventricular tachycardia: Secondary | ICD-10-CM | POA: Diagnosis not present

## 2024-02-15 DIAGNOSIS — B9562 Methicillin resistant Staphylococcus aureus infection as the cause of diseases classified elsewhere: Secondary | ICD-10-CM | POA: Diagnosis not present

## 2024-02-15 DIAGNOSIS — I2510 Atherosclerotic heart disease of native coronary artery without angina pectoris: Secondary | ICD-10-CM | POA: Diagnosis not present

## 2024-02-15 DIAGNOSIS — I252 Old myocardial infarction: Secondary | ICD-10-CM | POA: Diagnosis not present

## 2024-02-15 DIAGNOSIS — N179 Acute kidney failure, unspecified: Secondary | ICD-10-CM | POA: Diagnosis not present

## 2024-02-15 DIAGNOSIS — I5023 Acute on chronic systolic (congestive) heart failure: Secondary | ICD-10-CM | POA: Diagnosis not present

## 2024-02-15 DIAGNOSIS — E876 Hypokalemia: Secondary | ICD-10-CM | POA: Diagnosis not present

## 2024-02-15 DIAGNOSIS — N189 Chronic kidney disease, unspecified: Secondary | ICD-10-CM | POA: Diagnosis not present

## 2024-02-15 DIAGNOSIS — I2129 ST elevation (STEMI) myocardial infarction involving other sites: Secondary | ICD-10-CM | POA: Diagnosis not present

## 2024-02-15 DIAGNOSIS — Z87891 Personal history of nicotine dependence: Secondary | ICD-10-CM | POA: Diagnosis not present

## 2024-02-15 DIAGNOSIS — Z955 Presence of coronary angioplasty implant and graft: Secondary | ICD-10-CM | POA: Diagnosis not present

## 2024-02-15 DIAGNOSIS — I130 Hypertensive heart and chronic kidney disease with heart failure and stage 1 through stage 4 chronic kidney disease, or unspecified chronic kidney disease: Secondary | ICD-10-CM | POA: Diagnosis not present

## 2024-02-15 DIAGNOSIS — I25118 Atherosclerotic heart disease of native coronary artery with other forms of angina pectoris: Secondary | ICD-10-CM | POA: Diagnosis not present

## 2024-02-15 DIAGNOSIS — I2102 ST elevation (STEMI) myocardial infarction involving left anterior descending coronary artery: Secondary | ICD-10-CM | POA: Diagnosis not present

## 2024-02-15 DIAGNOSIS — D72829 Elevated white blood cell count, unspecified: Secondary | ICD-10-CM | POA: Diagnosis not present

## 2024-02-15 DIAGNOSIS — N183 Chronic kidney disease, stage 3 unspecified: Secondary | ICD-10-CM | POA: Diagnosis not present

## 2024-02-15 DIAGNOSIS — E1122 Type 2 diabetes mellitus with diabetic chronic kidney disease: Secondary | ICD-10-CM | POA: Diagnosis not present

## 2024-02-15 DIAGNOSIS — I5021 Acute systolic (congestive) heart failure: Secondary | ICD-10-CM | POA: Diagnosis not present

## 2024-02-15 DIAGNOSIS — I493 Ventricular premature depolarization: Secondary | ICD-10-CM | POA: Diagnosis not present

## 2024-02-16 DIAGNOSIS — I5021 Acute systolic (congestive) heart failure: Secondary | ICD-10-CM | POA: Insufficient documentation

## 2024-02-16 DIAGNOSIS — I2510 Atherosclerotic heart disease of native coronary artery without angina pectoris: Secondary | ICD-10-CM | POA: Diagnosis present

## 2024-02-20 NOTE — Discharge Summary (Signed)
 ------------------------------------------------------------------------------- Attestation signed by Antonetta Okey Pac, MD at 02/22/24 1255 I saw, evaluated and I discussed the patient with the resident.  I agree with the findings, assessments and plans of the resident outlined below.   I spent over in the discharge process of this patient. -------------------------------------------------------------------------------    Cardiology Discharge Summary  Identifying Information: Chris Washington [birth year] [id number]   Admit Date: 02/15/2024 Discharge Date: 02/21/2024  Discharge To: Home with Home Health and/or PT/OT Discharge Service: Cardiology [facility]) Discharge Attending Physician: Evalene Borer, MD Discharge Cardiology Fellow: Lang Alert Primary Care Physician: Montey [place], [facility]   Discharge Diagnoses: Principal Problem:   NSTEMI (non-ST elevated myocardial infarction)    (CMS-HCC) Active Problems:   Chronic back pain   Controlled type 2 diabetes mellitus with stage 3 chronic kidney disease, without long-term current use of insulin     (CMS-HCC)   Essential hypertension   Nausea & vomiting   Coronary artery disease involving native heart   Acute systolic heart failure    (CMS-HCC)   Acute kidney injury superimposed on chronic kidney disease   NSVT (nonsustained ventricular tachycardia)    (CMS-HCC)   Premature ventricular contractions (PVCs) (VPCs)   Principal Diagnosis: Principal Diagnosis: Non-ischemic Cardiomyopathy likely 2/2 tachyarrythmia  Secondary Diagnoses: Type I MI (NSTEMI) HFrEF, Chronic NSVT/PVC Hypertension Hyperlipidemia Diabetes Mellitus Type 2 COPD CKD       Hospital Course: Outpatient follow-up items: []  Referred to [facility]. Repeat echo in 3 months and cardiac MRI if EF not improved on GDMT []  Discharged with Zio patch for NSVT/PVCs, has appointment Dr. Lorean on 11/11 to discuss ablation []  Recheck BMP at PCP follow-up.   Held spironolactone on discharge given AKI.  Can resume in outpatient setting pending kidney function.    Unstable angina (improved)  Multivessel CAD Presented to [facility] ED 9/16 with cp/n/v/dyspnea. Troponin elevated 2.19, 2.8, 3.15 peak. Symptoms improved with nitroglycerin . Loaded with asa and started on heparin  gtt. CTA Chest 9/16 showed no PE or acute findings. S/p LHC on 9/17 with multi-vessel coronary artery disease including 70% mid-RCA stenosis (non-dominant), 60% mid-LAD stenosis (heavy plaque burden on IVUS), and 60% OM1 stenosis (concentric calcium  and moderate plaque burden on IVUS) s/p DES of mid-LAD. Symptoms likely combination of CAD and newly diagnosed HFrEF exacerbation as below. Started ASA 81mg  on 9/18, indefinitely.  Start Prasugral 81mg  daily on 9/18 for at least 12 months. Start atorvastatin  80mg  daily.    Acute HFrEF (30-35%) exacerbation  HTN Patient with dyspnea on presentation iso c/f NSTEMI. LVEDP 30 during LHC on 9/17. Most recent TTE 10/2015 with EF 55-60 and normal diastolic function. LHC on 9/18 with multivessel CAD. TTE on 9/18 with EF 30-35%, hypokinesis of all apical segments, and thinning of LV apex and apical anterior/septal segments.  Areas of hypokinesis do not correlate with his CAD.  Cardiomyopathy likely 2/2 to PVC burden. Discharged on metoprolol  succinate 200 mg daily, losartan 40, Jardiance 10, and as needed Lasix.  Spironolactone held on discharge given kidney function.  Referred to [facility] for further management of GDMT. Plan for repeat TTE in 3 months and consideration of further workup for cardiomyopathy including cardiac MRI.    NSVT  PVCs Frequent NSVT on telemetry with PVCs noted at [facility] ED s/p 100 mg lidocaine  and 150 mg IV amiodarone. Continued on amio gtt at OSH with improvement, transitioned to PO amio load on 9/17 PM. Received additional IV amio bolus on 9/18 AM. EP consulted, transitioned from amiodarone to metoprolol   and increased to  metoprolol  succinate 200 mg on discharge.  Discharged with Zio patch and scheduled for outpatient follow-up with EP on 11/11 to discuss ablation.  Leukocytosis  MRSE +ve blood cultures 1/2 bottles P/w several day hx of n/v, chest pain, poor po intake, and shortness of breath. Patient endorses several days of heavy diaphoresis, did not take temperature. Labs at OSH notable for WBC 16, s/p 1x IV Ceftriaxone . Blood cultures on 9/16 +MRSE in 1/2 bottles- likely contaminant. RPP -ve. WBC on admission 19. Given symptoms and leukocytosis will treat with broad spectrum abx pending infections work-up. CT A/P on 9/16 with no acute findings. CTA Chest with moderate centrilobular emphysema and diffuse bronchial wall thickening c/w hx of COPD. Patient noted to have several days of dysuria prior to admission. UA -ve but drawn after antibiotics. Repeat Bcx on 9/17 no growth.  Antibiotics discontinued.   AKI on CKD Cr 2.37 on 9/19, increasing from 1.6 on admission. Likely baseline of CKD with prior Cr ranging in 1.5-1.7. Likely prerenal iso IV diuresis and component of contrast induced nephropathy iso LHC on 9/17.  Creatinine on discharge 1.9.  Chronic Problems COPD (on nighttime O2) On PRN albuterol  and budesonide /formoterol  160-4.5 MCG at home.  Continue formulary equivalent of Breo Ellipta during admission.  Weaned to room air and passed 6-minute walk test prior to discharge.   Chronic low back pain - PRN Tylenol  and Oxy 5-10 PRN   BPH Continue home finasteride  and flomax    T2DM Diet controlled. Most recent A1c 6.9% on 07/2022, A1c on admission 6.8%    Procedures: LHC with Coronary Angiography and PCI No admission procedures for hospital encounter.  Discharge Day Services: BP 126/66   Pulse 56   Temp 36.8 C (98.2 F) (Oral)   Resp 15   Ht 176.5 cm (5' 9.5)   Wt 75.5 kg (166 lb 6.4 oz)   SpO2 97%   BMI 24.22 kg/m  Pt seen on the day of discharge and determined appropriate for  discharge.   Cardiac Rehab Referral: Cardiac rehab was discussed with the patient: yes Referral was placed: yes. If no, document reason: . Indication: Systolic heart failure  Condition at Discharge: stable  Discharge Medications:   Your Medication List     PAUSE taking these medications    spironolactone 50 MG tablet Wait to take this until your doctor or other care provider tells you to start again. Commonly known as: ALDACTONE Take 1 tablet (50 mg total) by mouth daily. And fluid retention (replaces Amlodipine )       STOP taking these medications    losartan 100 MG tablet Commonly known as: COZAAR       START taking these medications    aspirin  81 MG chewable tablet Chew 1 tablet (81 mg total) daily.   atorvastatin  80 MG tablet Commonly known as: LIPITOR Take 1 tablet (80 mg total) by mouth daily.   JARDIANCE 10 mg tablet Generic drug: empagliflozin Take 1 tablet (10 mg total) by mouth daily.   metoPROLOL  succinate 200 MG 24 hr tablet Commonly known as: Toprol -XL Take 1 tablet (200 mg total) by mouth daily. Start taking on: February 22, 2024   nitroglycerin  0.4 MG SL tablet Commonly known as: NITROSTAT  Place 1 tablet (0.4 mg total) under the tongue every five (5) minutes as needed for chest pain. Maximum of 3 doses in 15 minutes.   prasugrel  10 mg tablet Commonly known as: EFFIENT  Take 1 tablet (10 mg total) by mouth daily.  valsartan 40 MG tablet Commonly known as: DIOVAN Take 1 tablet (40 mg total) by mouth daily.       CHANGE how you take these medications    furosemide 20 MG tablet Commonly known as: LASIX Take 1 tablet (20 mg total) by mouth daily as needed for swelling (Take as needed for swelling (if weight goes up by 3 pounds in one day or by 5 pounds in 1 week)). What changed:  when to take this reasons to take this       CONTINUE taking these medications    acetaminophen  500 MG tablet Commonly known as: TYLENOL  Take 2  tablets (1,000 mg total) by mouth daily as needed for pain.   albuterol  90 mcg/actuation inhaler Commonly known as: PROVENTIL  HFA;VENTOLIN  HFA INHALE 2 PUFFS BY MOUTH EVERY 4 HOURS AS NEEDED FOR SHORTNESS OF BREATH FOR COUGH   budesonide -formoterol  160-4.5 mcg/actuation inhaler Commonly known as: SYMBICORT  Inhale 2 puffs two (2) times a day.   DULCOLAX (BISACODYL ) ORAL Take 2 tablets by mouth nightly.   famotidine 40 MG tablet Commonly known as: PEPCID Take 1 tablet (40 mg total) by mouth daily.   finasteride  5 mg tablet Commonly known as: PROSCAR  Take 1 tablet (5 mg total) by mouth.   ipratropium-albuterol  0.5-2.5 mg/3 mL nebulizer Commonly known as: DUO-NEB Inhale 3 mL by nebulization every six (6) hours as needed.   naloxone  4 mg/actuation nasal spray Commonly known as: NARCAN  1 spray (4 mg total) into each nostril.   oxyCODONE -acetaminophen  10-325 mg per tablet Commonly known as: PERCOCET Take 1 tablet by mouth every six (6) hours as needed.   pantoprazole  40 MG tablet Commonly known as: Protonix  Take 1 tablet (40 mg total) by mouth daily before breakfast.   polyethylene glycol 17 gram/dose powder Commonly known as: GLYCOLAX  Take 17 g by mouth daily.   tamsulosin  0.4 mg capsule Commonly known as: FLOMAX  Take 1 capsule (0.4 mg total) by mouth two (2) times a day.   traZODone  100 MG tablet Commonly known as: DESYREL  Take 1 tablet (100 mg total) by mouth.        Recent Labs: Microbiology Results (last day)     Procedure Component Value Date/Time Date/Time   Blood Culture #1 [[id number]]  (Normal) Collected: 02/15/24 1750   Lab Status: Preliminary result Specimen: Blood from 1 Peripheral Draw Updated: 02/19/24 1930    Blood Culture, Routine No Growth at 4 days   Blood Culture #2 [[id number]]  (Normal) Collected: 02/15/24 1750   Lab Status: Preliminary result Specimen: Blood from 1 Peripheral Draw Updated: 02/19/24 1930    Blood Culture, Routine No Growth  at 4 days      Lab Results  Component Value Date   WBC 8.4 02/21/2024   HGB 11.1 (L) 02/21/2024   HCT 33.5 (L) 02/21/2024   PLT 176 02/21/2024   Lab Results  Component Value Date   NA 141 02/21/2024   K 4.3 02/21/2024   CL 105 02/21/2024   CO2 22.0 02/21/2024   BUN 19 02/21/2024   CREATININE 1.99 (H) 02/21/2024   CALCIUM  8.7 02/21/2024   MG 1.9 02/21/2024   PHOS 4.6 02/21/2024   Lab Results  Component Value Date   ALKPHOS 46 02/20/2024   BILITOT 0.3 02/20/2024   BILIDIR 0.10 02/20/2024   PROT 5.7 02/20/2024   ALBUMIN 2.8 (L) 02/21/2024   ALT 13 02/20/2024   AST 21 02/20/2024   Lab Results  Component Value Date   PT 11.3 02/14/2024  INR 0.99 02/14/2024   APTT 30.4 02/15/2024   Radiology: Cath/Vascular Procedure Addendum Date: 02/17/2024 FINAL CARDIAC CATHETERIZATION REPORT CONCLUSIONS: - Anterolateral hypokinesis with reduced LV systolic function - Elevated LVEDP at 30 mm Hg - Multi-vessel coronary artery disease including 70% mid-RCA stenosis (non-dominant), 60% mid-LAD stenosis (heavy plaque burden on IVUS), and 60% OM2 stenosis (concentric calcium  and moderate plaque burden on IVUS). - IVUS showed diffuse moderate calcified atherosclerotic plaque in left main, LAD and circumflex arteries - Successful IVUS guided PCI to the mid LAD with a Xience 2.75x8 mm stent deployed RECOMMENDATIONS: - PCI performed (see details below) - Aggressive secondary prevention - ASA 81 mg daily indefinitely - Prasugrel  10 mg daily for at least 12 months. - CYP2C19 Genotyping. Plavix  genotyping was ordered to determine whether the patient could be safely switched to Plavix  as an anti-platelet agent in the future. If Plavix  was not used initially, it is because it is unknown whether the patient will metabolize Plavix  to the active drug and clinical studies have shown that patients who are not responders to Plavix  (as identified by CYP2C19 genotyping) have a higher incidence of death, myocardial  infarction and stroke when treated with Plavix  (Cardiovascular Revascularization Medicine 41 (2022): 115-121). The patient may be switched to Plavix  in the future if they are a responder because Plavix  is associated with lower costs and less bleeding risk than the alternatives. PATIENT NAME: Chris Washington, Chris Washington INDICATION: 73 year old male with NSTEMI (non-ST elevated myocardial infarction) (CMS-HCC) [I21.4] Mr. Gut is a 73 y.o. M with PMH including former tobacco use, COPD, HTN, T2DM, CKD, remote hx of reported MI who presented with chest pain and associated dyspnea, nausea and vomiting to the [facility] ED. Reports chest discomfort starting about two days ago. There he was found to have elevated troponin I 2.19--> 3.15->2.51. He was loaded with asa and started on heparin  gtt. Other labs notable for WBC count 16. Bcx x2 sent (1/2 positive with MRSE), Cr 1.8. He had frequent NSVT on telemetry, given 100 mg lidocaine  and then 150 mg IV amiodarone and started on amiodarone gtt. POCUS was concerning for reduced LVEF. PAST MEDICAL HISTORY INCLUDES: COPD, HTN, T2DM, CKD PROCEDURE DATE: 2024-02-15 ACCESS SITE: right radial artery PHYSICIANS: Zachary Car (Attending), Jacques Moulding (Fellow - Interventional), Ileana Daring (Fellow - Diagnostic) REFERRING: [place] Diagnostic procedures: coronary angiography, left heart cath Interventional procedures: PCI Contrast Used (ml): 130 DIAGNOSTIC FINDINGS: Coronary Angiography: Coronary Dominance: LCA Left Main: - Ostial Left Main: no significant stenosis - Left Main: Diffuse atherosclerotic plaque on IVUS without focal stenosis on angiography Left Anterior Descending: - Ostial LAD: Diffuse atherosclerotic plaque on IVUS without focal stenosis on angiography - Proximal LAD: Diffuse atherosclerotic plaque on IVUS without focal stenosis on angiography - Mid LAD: Hazy 60% stenosis - Distal LAD: no significant stenosis - First diagonal: no significant stenosis - Second diagonal: no  significant stenosis - Third diagonal: no significant stenosis Left Circumflex: - Ostial LCx: Diffuse atherosclerotic plaque on IVUS without focal stenosis on angiography - Proximal LCx: Diffuse atherosclerotic plaque on IVUS without focal stenosis on angiography - Mid LCx: Diffuse atherosclerotic plaque on IVUS without focal stenosis on angiography - Distal LCx: no significant stenosis - OM1: small, no significant disease - OM2: 60% stenosis - LPDA: no significant stenosis Right coronary artery: - Ostial RCA: no significant stenosis - Proximal RCA: mild irregularity - Mid RCA: 70% stenosis - Distal RCA: no significant stenosis - PL1: no significant stenosis Left Ventriculogram: - A LAO hand  injection left ventriculogram was performed. Left ventricular systolic function is moderately reduced. Anterolateral hypokinesis. The ejection fraction is estimated at 35 to 40%. Hemodynamics: BP / Ao (mmHg): 131/91  Mean: 101  Left Heart: LVEDP (mmHg): 30  Technique: The right radial artery was accessed with an angiocath and a 53F sheath was placed. After accessing the ascending aorta, heparin  was administered. At the end of the procedure, a TR band was placed to achieve hemostasis. Ultrasound was used for arterial access. IVUS and Percutaneous coronary intervention (PCI): The decision was made to investigate the OM2 and mid-LAD lesions.  Additional heparin  was given.  An EBU 3.5 guide catheter was used to engage the left coronary artery.  A Fielder FC wire was placed in the distal LAD, and IVUS was performed of the LAD. This showed diffuse concentric calcified atherosclerotic plaque in the left main and proximal LAD. The mid-LAD had a focal stenosis with eccentric plaque and an area of disruption. The proximal reference vessel lumen measured approximately 3.0 mm in diameter, and the distal reference vessel lumen measured approximately 2.75 mm in diameter. The [facility] wire was then redirected into the distal OM 2.  IVUS of  the proximal circumflex and OM 2 showed diffuse concentric calcified atherosclerotic plaque in the left main and proximal circumflex. There was a focal stenosis in OM2 that was heavily calcified (360 degree arc of calcium ) without evidence of plaque disruption.  Reference vessel diameter was 3.3 mm distally and 3.5 mm proximally.  The IRA was not clearly identified on IVUS but the lesion in the mid LAD showed evidence of a plaque rupture event and was felt to be the culprit lesion.  The [facility] was then redirected back down the distal LAD.  A Xience 2.75 x 8 mm drug-eluting stent was deployed at 14 atm and then 16 atm. The stent delivery system was removed, and final angiography showed excellent angiographic result with TIMI-3 flow no evidence of dissection or perforation. LESION 1: Vessel: mid LAD Stenosis: pre: 60%, TIMI 3, post: 0 %, TIMI 3 Antithrombotic regimen: ASA and prasugrel  Guide catheter: 53Fr EBU 3.5 Guide wire: Fielder FC Pre-dilation: None Intervention device: Xience 2.75 x 8mm Drug Eluting Stent (12 ATM) Post-dilation: Stent delivery system re-inflated to (14 ATM) Intravascular ultrasound (IVUS): IVUS was performed for vessel assessment of the left anterior descending artery for  vessel sizing and vessel assessment. The [facility] IVUS catheter was advanced over the 0.014" coronary wire.  The proximal reference vessel lumen measured approximately 3.0 mm in diameter, and the distal reference vessel lumen measured approximately 2.75 mm in diameter. The vessel showed concern for a plaque rupture. Result: Excellent angiographic result with TIMI 3 flow and no evidence of dissection, perforation, distal embolization, or loss of side branch Cath History / NCDR Angina class: CCS III (marked limitation of ordinary activity) Cardiac arrest: No Post arrest responsiveness: N/A Cath presentation: NSTEMI CSHA frailty score: 4 (vulnerable) CKD stage: 3 (eGFR 30-59) Dialysis: No Noninvasive study result: Not  performed Noninvasive study type: N/A PMH: former tobacco use disorder, COPD, HTN, T2DM, CKD, Procedure status: Time-sensitive NYHA functional class: III (significant symptoms with ordinary activity) Surgical turndown: NA Catheters used: 53F JR 4, 53F Ebu3.5 Complications: none reported Estimated blood loss: < 30 cc Radiation: Fluoro time (min): 13.1, Air Kerma Dose (mGy): 818.1, DAP (Gy-cm2): 50.2 I have reviewed the recent history physical and documentation. I personally spent 64 minutes, continuously monitoring the patient face to face during the administration of moderate  sedation. Independent observer RN was present for the duration of the procedure to assist in patient monitoring. Pre and post sedation activities have been reviewed. I (Dr. Zachary Car) was present for the entire procedure.   Result Date: 02/17/2024 Images from the original result were not included. FINAL CARDIAC CATHETERIZATION REPORT CONCLUSIONS: - Anterolateral hypokinesis with reduced LV systolic function - Elevated LVEDP at 30 mm Hg - Multi-vessel coronary artery disease including 70% mid-RCA stenosis (non-dominant), 60% mid-LAD stenosis (heavy plaque burden on IVUS), and 60% OM2 stenosis (concentric calcium  and moderate plaque burden on IVUS). - IVUS showed diffuse moderate calcified atherosclerotic plaque in left main, LAD and circumflex arteries - Successful IVUS guided PCI to the mid LAD with a Xience 2.75x8 mm stent deployed RECOMMENDATIONS: - PCI performed (see details below) - Aggressive secondary prevention - ASA 81 mg daily indefinitely - Prasugrel  10 mg daily for at least 12 months. - CYP2C19 Genotyping. Plavix  genotyping was ordered to determine whether the patient could be safely switched to Plavix  as an anti-platelet agent in the future. If Plavix  was not used initially, it is because it is unknown whether the patient will metabolize Plavix  to the active drug and clinical studies have shown that patients who are not  responders to Plavix  (as identified by CYP2C19 genotyping) have a higher incidence of death, myocardial infarction and stroke when treated with Plavix  (Cardiovascular Revascularization Medicine 41 (2022): 115-121). The patient may be switched to Plavix  in the future if they are a responder because Plavix  is associated with lower costs and less bleeding risk than the alternatives. PATIENT NAME: Chris Washington, Chris Washington INDICATION: 72 year old male with NSTEMI (non-ST elevated myocardial infarction) (CMS-HCC) [I21.4] Mr. Natzke is a 73 y.o. M with PMH including former tobacco use, COPD, HTN, T2DM, CKD, remote hx of reported MI who presented with chest pain and associated dyspnea, nausea and vomiting to the [facility] ED. Reports chest discomfort starting about two days ago. There he was found to have elevated troponin I 2.19--> 3.15->2.51. He was loaded with asa and started on heparin  gtt. Other labs notable for WBC count 16. Bcx x2 sent (1/2 positive with MRSE), Cr 1.8. He had frequent NSVT on telemetry, given 100 mg lidocaine  and then 150 mg IV amiodarone and started on amiodarone gtt. POCUS was concerning for reduced LVEF. PAST MEDICAL HISTORY INCLUDES: COPD, HTN, T2DM, CKD PROCEDURE DATE: 2024-02-15 ACCESS SITE: right radial artery PHYSICIANS: Zachary Car (Attending), Jacques Moulding (Fellow - Interventional), Ileana Daring (Fellow - Diagnostic) REFERRING: [place] Diagnostic procedures: coronary angiography, left heart cath Interventional procedures: PCI Contrast Used (ml): 130 DIAGNOSTIC FINDINGS: Coronary Angiography: Coronary Dominance: LCA Left Main: - Ostial Left Main: no significant stenosis - Left Main: Diffuse atherosclerotic plaque on IVUS without focal stenosis on angiography Left Anterior Descending: - Ostial LAD: Diffuse atherosclerotic plaque on IVUS without focal stenosis on angiography - Proximal LAD: Diffuse atherosclerotic plaque on IVUS without focal stenosis on angiography - Mid LAD: Hazy 60% stenosis -  Distal LAD: no significant stenosis - First diagonal: no significant stenosis - Second diagonal: no significant stenosis - Third diagonal: no significant stenosis Left Circumflex: - Ostial LCx: Diffuse atherosclerotic plaque on IVUS without focal stenosis on angiography - Proximal LCx: Diffuse atherosclerotic plaque on IVUS without focal stenosis on angiography - Mid LCx: Diffuse atherosclerotic plaque on IVUS without focal stenosis on angiography - Distal LCx: no significant stenosis - OM1: small, no significant disease - OM2: 60% stenosis - LPDA: no significant stenosis Right coronary artery: -  Ostial RCA: no significant stenosis - Proximal RCA: mild irregularity - Mid RCA: 70% stenosis - Distal RCA: no significant stenosis - PL1: no significant stenosis Left Ventriculogram: - A LAO hand injection left ventriculogram was performed. Left ventricular systolic function is moderately reduced. Anterolateral hypokinesis. The ejection fraction is estimated at 35 to 40%. Hemodynamics: BP / Ao (mmHg): 131/91  Mean: 101  Left Heart: LVEDP (mmHg): 30  Technique: The right radial artery was accessed with an angiocath and a 74F sheath was placed. After accessing the ascending aorta, heparin  was administered. At the end of the procedure, a TR band was placed to achieve hemostasis. Ultrasound was used for arterial access. IVUS and Percutaneous coronary intervention (PCI): The decision was made to investigate the OM2 and mid-LAD lesions.  Additional heparin  was given.  An EBU 3.5 guide catheter was used to engage the left coronary artery.  A Fielder FC wire was placed in the distal LAD, and IVUS was performed of the LAD. This showed diffuse concentric calcified atherosclerotic plaque in the left main and proximal LAD. The mid-LAD had a focal stenosis with eccentric plaque and an area of disruption.    The [facility] wire was then redirected into the distal OM 2.  IVUS of the proximal circumflex and OM 2 showed diffuse concentric  calcified atherosclerotic plaque in the left main and proximal circumflex. There was a focal stenosis in OM2 that was heavily calcified (360 degree arc of calcium ) without evidence of plaque disruption.   The IRA was not clearly identified on IVUS but the lesion in the mid LAD showed evidence of a plaque rupture event and was felt to be the culprit lesion.  The [facility] was then redirected back down the distal LAD.  A Xience 2.75 x 8 mm drug-eluting stent was deployed at 14 atm and then 16 atm. The stent delivery system was removed, and final angiography showed excellent angiographic result with TIMI-3 flow no evidence of dissection or perforation. LESION 1: Vessel: mid LAD Stenosis: pre: 60%, TIMI 3, post: 0 %, TIMI 3 Antithrombotic regimen: ASA and prasugrel  Guide catheter: 74Fr EBU 3.5 Guide wire: Fielder FC Pre-dilation: None Intervention device: Xience 2.75 x 8mm Drug Eluting Stent (12 ATM) Post-dilation: Stent delivery system re-inflated to (14 ATM) Intravascular ultrasound (IVUS): IVUS was performed for vessel assessment of the left anterior descending artery for  vessel sizing and vessel assessment. The [facility] IVUS catheter was advanced over the 0.014" coronary wire.  The proximal reference vessel lumen measured approximately 3.0 mm in diameter, and the distal reference vessel lumen measured approximately 2.75 mm in diameter. The vessel showed concern for a plaque rupture. Result: Excellent angiographic result with TIMI 3 flow and no evidence of dissection, perforation, distal embolization, or loss of side branch Cath History / NCDR Angina class: CCS III (marked limitation of ordinary activity) Cardiac arrest: No Post arrest responsiveness: N/A Cath presentation: NSTEMI CSHA frailty score: 4 (vulnerable) CKD stage: 3 (eGFR 30-59) Dialysis: No Noninvasive study result: Not performed Noninvasive study type: N/A PMH: former tobacco use disorder, COPD, HTN, T2DM, CKD, Procedure status: Time-sensitive NYHA  functional class: III (significant symptoms with ordinary activity) Surgical turndown: NA Catheters used: 74F JR 4, 74F Ebu3.5 Complications: none reported Estimated blood loss: < 30 cc Radiation: Fluoro time (min): 13.1, Air Kerma Dose (mGy): 818.1, DAP (Gy-cm2): 50.2 I have reviewed the recent history physical and documentation. I personally spent 64 minutes, continuously monitoring the patient face to face during the administration of  moderate sedation. Independent observer RN was present for the duration of the procedure to assist in patient monitoring. Pre and post sedation activities have been reviewed. I (Dr. Zachary Car) was present for the entire procedure.  ECG 12 Lead Result Date: 02/16/2024 SINUS RHYTHM WITH FREQUENT PREMATURE VENTRICULAR BEATS AND PREMATURE ATRIAL BEATS SEPTAL INFARCT  (CITED ON OR BEFORE 12-Jan-2016) T WAVE ABNORMALITY, CONSIDER LATERAL ISCHEMIA ABNORMAL ECG WHEN COMPARED WITH ECG OF 16-Feb-2024 07:50, NO SIGNIFICANT CHANGE WAS FOUND Confirmed by Von Shawl [phone]) on 02/16/2024 7:14:40 PM  Echocardiogram W Colorflow Spectral Doppler With Contrast Result Date: 02/16/2024 Patient Info Name:     Chris Washington Age:     89 years DOB:     08-02-1950 Gender:     Male MRN:     [id number] Accession #:     [id number] UN Account #:     [id number] Ht:     177 cm Wt:     77 kg BSA:     1.95 m2 BP:     110 /     69 mmHg Exam Date:     02/16/2024 9:55 AM Admit Date:     02/15/2024 Exam Type:     ECHOCARDIOGRAM W COLORFLOW SPECTRAL DOPPLER W CONTRAST Technical Quality:     Fair Staff Sonographer:     Norman Holm Referring Physician:     Camellia [place] Ordering Physician:     Sherlean DELENA Hasten Study Info Indications      - Chest pain,  eval cardiomyopathy ; Definity/Optison Procedure(s)   Complete two-dimensional, color flow and Doppler transthoracic echocardiogram is performed with contrast to opacify the left ventricle and to improve the delineation of the left ventricle  endocardial borders. Summary   1. The left ventricle is upper normal in size with mildly increased wall thickness.   2. The left ventricular systolic function is severely decreased, LVEF is visually estimated at 30-35%.   3. There is hypokinesis of all apical segments, mid anterior, anterolateral, anteroseptal, inferolateral and inferoseptal segments. There is thinning of the LV apex and apical anterior / apical septal segments.   4. No left ventricular thrombus visualized.   5. The right ventricle is normal in size, with low normal systolic function.   6. IVC size and inspiratory change suggest mildly elevated right atrial pressure. (5-10 mmHg). Left Ventricle   The left ventricle is upper normal in size with mildly increased wall thickness. There is grade I diastolic dysfunction (impaired relaxation). The left ventricular systolic function is severely decreased, LVEF is visually estimated at 30-35%. There is hypokinesis of all apical segments, mid anterior, anterolateral, anteroseptal, inferolateral and inferoseptal segments. There is thinning of the LV apex and apical anterior / apical septal segments. No left ventricular thrombus visualized. Right Ventricle   The right ventricle is normal in size, with low normal systolic function. Left Atrium   The left atrium is normal in size. Right Atrium   The right atrium is normal in size. Aortic Valve   The aortic valve is trileaflet with normal appearing leaflets with normal excursion. There is no significant aortic regurgitation. There is no evidence of a significant transvalvular gradient. Mitral Valve   The mitral valve leaflets are normal with normal leaflet mobility. There is trivial mitral valve regurgitation. Tricuspid Valve   The tricuspid valve leaflets are normal, with normal leaflet mobility. There is no significant tricuspid regurgitation. The pulmonary systolic pressure cannot be estimated due to insufficient TR signal. Pulmonic Valve   The pulmonic  valve  is poorly visualized, but probably normal. There is no significant pulmonic regurgitation. Aorta   The aorta is normal in size in the visualized segments. Inferior Vena Cava   IVC size and inspiratory change suggest mildly elevated right atrial pressure. (5-10 mmHg). Pericardium/Pleural   There is no pericardial effusion. Ventricles ---------------------------------------------------------------------- Name                                 Value        Normal ---------------------------------------------------------------------- LV Dimensions 2D/MM ----------------------------------------------------------------------  IVS Diastolic Thickness (2D)                                1.1 cm       0.6-1.0 LVID Diastole (2D)                  5.1 cm       4.2-5.8  LVPW Diastolic Thickness (2D)                                1.2 cm       0.6-1.0 LVID Systole (2D)                   3.7 cm       2.5-4.0 LVOT Diameter                       2.0 cm               LV Mass Index (2D Cubed)          117 g/m2        49-115  Relative Wall Thickness (2D)                                  0.47        <=0.42 RV Dimensions 2D/MM ----------------------------------------------------------------------  RV Basal Diastolic Dimension                           3.9 cm       2.5-4.1 TAPSE                               1.6 cm         >=1.7 Atria ---------------------------------------------------------------------- Name                                 Value        Normal ---------------------------------------------------------------------- LA Dimensions ---------------------------------------------------------------------- LA Dimension (2D)                   3.6 cm       3.0-4.1 LA Volume Index (4C A-L)        26.32 ml/m2               LA Volume Index (2C A-L)        29.22 ml/m2               LA Volume (BP MOD)  50 ml               LA Volume Index (BP MOD)        25.70 ml/m2   16.00-34.00 RA Dimensions  ---------------------------------------------------------------------- RA Area (4C)                      13.0 cm2        <=18.0 RA Area (4C) Index              6.7 cm2/m2               RA ESV Index (4C MOD)             18 ml/m2         18-32 Left Ventricular Outflow Tract ---------------------------------------------------------------------- Name                                 Value        Normal ---------------------------------------------------------------------- LVOT 2D ---------------------------------------------------------------------- LVOT Diameter                       2.0 cm               LVOT Area                          3.1 cm2               LVOT Doppler ---------------------------------------------------------------------- LVOT Peak Velocity                 0.8 m/s               LVOT VTI                             14 cm               LVOT Stroke Volume                   45 ml               LVOT SI                           23 ml/m2 Aortic Valve ---------------------------------------------------------------------- Name                                 Value        Normal ---------------------------------------------------------------------- AV Doppler ---------------------------------------------------------------------- AV Peak Velocity                   1.8 m/s               AV Peak Gradient                   12 mmHg               AV Area (Cont Eq Vel)              1.5 cm2               AV Area Index (Cont Eq Vel)     0.8 cm2/m2  AV DI (Vel)                           0.48 Mitral Valve ---------------------------------------------------------------------- Name                                 Value        Normal ---------------------------------------------------------------------- MV Diastolic Function ---------------------------------------------------------------------- MV E Peak Velocity                 75 cm/s               MV A Peak Velocity                 77 cm/s               MV  E/A                                 1.0               MV Annular TDI ---------------------------------------------------------------------- MV Septal e' Velocity             5.4 cm/s         >=8.0 MV E/e' (Septal)                      13.9               MV Lateral e' Velocity            7.2 cm/s        >=10.0 MV E/e' (Lateral)                     10.5               MV e' Average                     6.3 cm/s               MV E/e' (Average)                     12.2 Tricuspid Valve ---------------------------------------------------------------------- Name                                 Value        Normal ---------------------------------------------------------------------- Estimated PAP/RSVP ---------------------------------------------------------------------- RA Pressure                         8 mmHg           <=5 Pulmonic Valve ---------------------------------------------------------------------- Name                                 Value        Normal ---------------------------------------------------------------------- PV Doppler ---------------------------------------------------------------------- PV Peak Velocity                   0.9 m/s Aorta ---------------------------------------------------------------------- Name                                 Value  Normal ---------------------------------------------------------------------- Ascending Aorta ---------------------------------------------------------------------- Ao Root Diameter (2D)               3.1 cm               Ao Root Diam Index (2D)          1.6 cm/m2 Venous ---------------------------------------------------------------------- Name                                 Value        Normal ---------------------------------------------------------------------- IVC/SVC ---------------------------------------------------------------------- IVC Diameter (Exp 2D)               2.1 cm         <=2.1 Report Signatures Finalized by Margery Megan Ruth  MD on 02/16/2024 01:42 PM Resident Beverely Coward  MD on 02/16/2024 12:36 PM  ECG 12 Lead Result Date: 02/16/2024 ATRIAL FIBRILLATION WITH RAPID VENTRICULAR RESPONSE WITH PREMATURE VENTRICULAR OR ABERRANTLY CONDUCTED BEATS ANTEROSEPTAL INFARCT (CITED ON OR BEFORE 12-Jan-2016) T WAVE ABNORMALITY, CONSIDER LATERAL ISCHEMIA ABNORMAL ECG WHEN COMPARED WITH ECG OF 15-Feb-2024 11:55, ATRIAL FIBRILLATION HAS REPLACED SINUS RHYTHM SERIAL CHANGES OF ANTEROSEPTAL INFARCT PRESENT Confirmed by Von Shawl ([id number]) on 02/16/2024 8:51:11 AM  ECG 12 Lead Result Date: 02/16/2024 SINUS RHYTHM WITH FREQUENT PREMATURE VENTRICULAR BEATS LOW VOLTAGE QRS SEPTAL INFARCT  (CITED ON OR BEFORE 12-Jan-2016) T WAVE ABNORMALITY, CONSIDER ANTEROLATERAL ISCHEMIA PROLONGED QT ABNORMAL ECG WHEN COMPARED WITH ECG OF 15-Feb-2024 00:47, SINUS RHYTHM HAS REPLACED ATRIAL FIBRILLATION Confirmed by Von Shawl ([id number]) on 02/16/2024 8:51:01 AM  ECG 12 Lead Result Date: 02/15/2024 ATRIAL FIBRILLATION WITH RAPID VENTRICULAR RESPONSE WITH PREMATURE VENTRICULAR OR ABERRANTLY CONDUCTED BEATS LOW VOLTAGE QRS SEPTAL INFARCT  , AGE UNDETERMINED T WAVE ABNORMALITY, CONSIDER ANTEROLATERAL ISCHEMIA ABNORMAL ECG   Discharge Instructions: Activity Instructions     Activity as tolerated         Other Instructions     Call MD for:  difficulty breathing, headache or visual disturbances     Call MD for:  persistent nausea or vomiting     Call MD for:  severe uncontrolled pain     Call MD for:  temperature >38.5 Celsius     Discharge instructions     Acute Myocardial Infarction (AMI) Discharge Instructions  You have been admitted to [facility] for evaluation and treatment of Myocardial Infarction (heart attack).   While in the hospital, you had the following tests and procedures: - Cardiac Catheterization: This test showed a blockage in your artery that was treated with a stent or balloon - Echocardiogram: This is an ultrasound of  your heart that showed that your heart function is reduced. Your current Ejection Fraction (EF) is 30-35%.  If following discharge from the hospital you notice the development or worsening of any symptoms such as chest pain, shortness of breath, nausea, vomiting, fevers, or chills, please return to the emergency department.   If you develop other symptoms, or if you have trouble obtaining any of your medications, you can contact the [facility] at [phone], or you can go to the [facility] at [facility] in [place]. You can also call the [facility] Link at [phone].   Please continue to follow-up with your outpatient care providers.  Some of your follow-up appointments have been listed below.  Please be sure to attend these appointments at the dates and times indicated.  Your discharge medications are listed above.  Please continue to take these medications as directed by the pharmacist. Do not stop any of these medications without discussion with your primary care provider or cardiologist.   PATIENT INSTRUCTIONS 1. Take all of your medicines as prescribed. 2. Do NOT take ibuprofen (Motrin, Advil) or naproxen  (Aleve ). 3. Do NOT start taking any new vitamins or supplements without discussion with your primary care provider or cardiologist. 4. Attend all of your follow-up clinic appointments. 5. Do not stop taking your Aspirin  or Prasugrel  (Effient ) without the specific permission from your cardiologist.  6. For chest pain you can take a Nitroglycerin  tablet under the tongue every five minutes for a total of three tablets. If you continue to experience pain after 2 tablets notify your physician or dial 911. STOP taking nitroglycerin  if you become dizzy or light-headed. Do NOT take nitroglycerin  with phosphodiesterase inhibitors (Viagra, Cialis, or Levitra). 7. Follow a heart healthy diet, which is low in saturated fat and salt. There is a  handout about heart healthy diets attached to this discharge summary. 8. If you are a smoker, please stop smoking and please don't start if you currently do not smoke. If you have more questions about this, call 1-800-Quit-Now or speak with your primary care provider. 9. You have been referred to a cardiac rehab program. They will contact you with further information and start date. 10. If you have had a cardiac catheterization:  - Do no lift more than 10 pounds for one week.  - Do not soak the wound (for example, in a bath) for one week.  - If your wound begins to bleed, apply firm, direct pressure. If this does not stop the bleeding, call 911.   Cardiac Rehab   Follow-Up Appointments     EVIDENCED BASED MANAGEMENT AT DISCHARGE: Antiplatelet:  Aspirin  81 mg daily  P2Y12 Inhibitor: Prasugrel  (Effient ) 10 mg daily for at least 12 months ACE-Inhibitor: Valsartan  Beta Blocker: Metoprolol   Statin: Atorvastatin  80 mg daily  Nitroglycerin : Sublingual NTG prescribed  Smoking Cessation: Stopped smoking  Cardiac Rehabilitation: Referral made  Assessment of EF: Echocardiogram EF = 30-35%  LDL: Lab Results      Component                Value               Date                      LDL                      102 (H)             02/15/2024            Cardiology Follow Up: Appointment made on 04/10/2024 with electrophysiology, referred to [facility] clinic.       Follow Up instructions and Outpatient Referrals    Ambulatory Referral to Cardiac Rehab     Reason for Cardiac Rehab Referral:  PCI/PTCA/Stent Comment - Referral  placed using [email or web address]  Acute MI (STEMI/NSTEMI) within the last 12 months     Reason for referral: NSTEMI with placement of an LAD PCI   Ambulatory Referral to Cardiology     Reason for referral: Consideration of PVC ablation   Specific Service Requested: Cardiac Subspecialties   What subspecialty is this for?: Electrophysiology (EP)   Ambulatory Referral to Cardiology      Reason for referral: New onset  HFrEF, NICM with recent hospitalization   Specific Service Requested: General Cardiology   Requested follow up plan: You would evaluate and manage.   Ambulatory Referral to Home Health     Reason for referral: 74 yo male s/p NSTEMI   Physician to follow patient's care: PCP   Disciplines requested:  Physical Therapy Occupational Therapy     Physical Therapy requested: Evaluate and treat   Occupational Therapy Requested: Evaluate and treat   Requested follow up plan: You would evaluate and manage.   Call MD for:  difficulty breathing, headache or visual disturbances     Call MD for:  persistent nausea or vomiting     Call MD for:  severe uncontrolled pain     Call MD for:  temperature >38.5 Celsius     Discharge instructions      Appointments which have been scheduled for you    Mar 26, 2024 1:00 PM (Arrive by 12:45 PM) [facility] with Devere JAYSON Banks, MD [facility] CARDIOLOGY [place] [facility]) [address] [facility] [address] [place] [address] [phone]     Apr 10, 2024 10:30 AM (Arrive by 10:15 AM) OFFICE VISIT with Anil Kishin Gehi, MD [facility] CARDIOLOGY [place] [facility] REG[facility]) [address] [facility] [address] [place] [address] [phone]    Additional instructions:   Hospital follow up  appointment has been scheduled with your PCP Dr. Rankin Dike, on 02/28/24 @ 2:00 pm Phone [phone]      Length of Discharge: I spent greater than 30 mins in the discharge of this patient.  Sherlean Hasten, MD Internal Medicine, PGY-2

## 2024-02-27 DIAGNOSIS — R079 Chest pain, unspecified: Secondary | ICD-10-CM | POA: Diagnosis not present

## 2024-02-27 DIAGNOSIS — I4891 Unspecified atrial fibrillation: Secondary | ICD-10-CM | POA: Diagnosis not present

## 2024-02-28 DIAGNOSIS — J9691 Respiratory failure, unspecified with hypoxia: Secondary | ICD-10-CM | POA: Diagnosis not present

## 2024-02-28 DIAGNOSIS — G4734 Idiopathic sleep related nonobstructive alveolar hypoventilation: Secondary | ICD-10-CM | POA: Diagnosis not present

## 2024-02-28 DIAGNOSIS — N183 Chronic kidney disease, stage 3 unspecified: Secondary | ICD-10-CM | POA: Diagnosis not present

## 2024-02-28 DIAGNOSIS — I10 Essential (primary) hypertension: Secondary | ICD-10-CM | POA: Diagnosis not present

## 2024-02-28 DIAGNOSIS — I5020 Unspecified systolic (congestive) heart failure: Secondary | ICD-10-CM | POA: Diagnosis not present

## 2024-02-28 DIAGNOSIS — I2510 Atherosclerotic heart disease of native coronary artery without angina pectoris: Secondary | ICD-10-CM | POA: Diagnosis not present

## 2024-02-28 DIAGNOSIS — R001 Bradycardia, unspecified: Secondary | ICD-10-CM | POA: Diagnosis not present

## 2024-03-13 DIAGNOSIS — I214 Non-ST elevation (NSTEMI) myocardial infarction: Secondary | ICD-10-CM | POA: Diagnosis not present

## 2024-03-13 DIAGNOSIS — J441 Chronic obstructive pulmonary disease with (acute) exacerbation: Secondary | ICD-10-CM | POA: Diagnosis not present

## 2024-03-13 DIAGNOSIS — G8929 Other chronic pain: Secondary | ICD-10-CM | POA: Diagnosis not present

## 2024-03-13 DIAGNOSIS — I493 Ventricular premature depolarization: Secondary | ICD-10-CM | POA: Diagnosis not present

## 2024-03-13 DIAGNOSIS — R918 Other nonspecific abnormal finding of lung field: Secondary | ICD-10-CM | POA: Diagnosis not present

## 2024-03-13 DIAGNOSIS — Z7951 Long term (current) use of inhaled steroids: Secondary | ICD-10-CM | POA: Diagnosis not present

## 2024-03-13 DIAGNOSIS — I959 Hypotension, unspecified: Secondary | ICD-10-CM | POA: Diagnosis not present

## 2024-03-13 DIAGNOSIS — N183 Chronic kidney disease, stage 3 unspecified: Secondary | ICD-10-CM | POA: Diagnosis not present

## 2024-03-13 DIAGNOSIS — I10 Essential (primary) hypertension: Secondary | ICD-10-CM | POA: Diagnosis not present

## 2024-03-13 DIAGNOSIS — Z20822 Contact with and (suspected) exposure to covid-19: Secondary | ICD-10-CM | POA: Diagnosis not present

## 2024-03-13 DIAGNOSIS — Z7982 Long term (current) use of aspirin: Secondary | ICD-10-CM | POA: Diagnosis not present

## 2024-03-13 DIAGNOSIS — E1122 Type 2 diabetes mellitus with diabetic chronic kidney disease: Secondary | ICD-10-CM | POA: Diagnosis not present

## 2024-03-13 DIAGNOSIS — R079 Chest pain, unspecified: Secondary | ICD-10-CM | POA: Diagnosis not present

## 2024-03-13 DIAGNOSIS — N189 Chronic kidney disease, unspecified: Secondary | ICD-10-CM | POA: Diagnosis not present

## 2024-03-13 DIAGNOSIS — I252 Old myocardial infarction: Secondary | ICD-10-CM | POA: Diagnosis not present

## 2024-03-13 DIAGNOSIS — I2510 Atherosclerotic heart disease of native coronary artery without angina pectoris: Secondary | ICD-10-CM | POA: Diagnosis not present

## 2024-03-13 DIAGNOSIS — Z79891 Long term (current) use of opiate analgesic: Secondary | ICD-10-CM | POA: Diagnosis not present

## 2024-03-13 DIAGNOSIS — R059 Cough, unspecified: Secondary | ICD-10-CM | POA: Diagnosis not present

## 2024-03-13 DIAGNOSIS — R042 Hemoptysis: Secondary | ICD-10-CM | POA: Diagnosis not present

## 2024-03-13 DIAGNOSIS — I440 Atrioventricular block, first degree: Secondary | ICD-10-CM | POA: Diagnosis not present

## 2024-03-13 DIAGNOSIS — Z7984 Long term (current) use of oral hypoglycemic drugs: Secondary | ICD-10-CM | POA: Diagnosis not present

## 2024-03-13 DIAGNOSIS — R5383 Other fatigue: Secondary | ICD-10-CM | POA: Diagnosis not present

## 2024-03-13 DIAGNOSIS — I5021 Acute systolic (congestive) heart failure: Secondary | ICD-10-CM | POA: Diagnosis not present

## 2024-03-13 DIAGNOSIS — I129 Hypertensive chronic kidney disease with stage 1 through stage 4 chronic kidney disease, or unspecified chronic kidney disease: Secondary | ICD-10-CM | POA: Diagnosis not present

## 2024-03-13 DIAGNOSIS — E119 Type 2 diabetes mellitus without complications: Secondary | ICD-10-CM | POA: Diagnosis not present

## 2024-03-13 DIAGNOSIS — Z79899 Other long term (current) drug therapy: Secondary | ICD-10-CM | POA: Diagnosis not present

## 2024-03-13 DIAGNOSIS — Z87891 Personal history of nicotine dependence: Secondary | ICD-10-CM | POA: Diagnosis not present

## 2024-03-13 NOTE — ED Provider Notes (Signed)
 [facility] Emergency Department Provider Note    ED Clinical Impression    Final diagnoses:  COPD exacerbation    (CMS-HCC) (Primary)        Impression, Medical Decision Making, Progress Notes and Critical Care    Impression, Differential Diagnosis and Plan of Care  Medical Decision Making A 73 year old male with a recent hospitalization presents with a several-week history of worsening cough productive of yellow sputum, intermittent hemoptysis, chest pain that began this morning while at rest, and night sweats but no documented fever. He has a history of chronic pain and uses albuterol  nebulizer treatments at home. Exam findings include ongoing cough and chest discomfort, with recent labs showing CBC and BMP at baseline and pro-BNP pending. There is concern for pneumonia, COPD exacerbation, and pulmonary embolism given his symptoms and recent hospitalization.  Differential diagnosis includes, but is not limited to: - Pneumonia: Considered due to productive cough, yellow sputum, chest pain, and recent hospitalization, though no fever was documented and chest imaging is pending. - Chronic Obstructive Pulmonary Disease (COPD) Exacerbation: Considered due to history of COPD, increased cough, sputum production, and use of home nebulizer treatments, with possible overlap with infectious or thromboembolic processes. - Pulmonary Embolism: Considered due to recent hospitalization, new chest pain, hemoptysis, and risk factors, with further evaluation planned to rule out this diagnosis. - ACS: Considered due to shortness of breath with chest pain, will evaluate with troponin level, EKG.  Lower suspicion of ACS given that the patient's pain started while at rest today and worsens with cough. - CHF exacerbation: Considered due to shortness of breath with history of CHF.  However lower suspicion due to lack of rales on lung exam or evidence of lower extremity edema.  Cough with  hemoptysis, chest pain, and shortness of breath (possible pneumonia, COPD exacerbation, or pulmonary embolism) - Order chest X-ray and chest CT to evaluate for pneumonia and pulmonary embolism - Order troponin and pro-BNP to assess for cardiac involvement - Administer Duoneb treatment and initial dose of steroid - Continue albuterol  nebulizer treatments as needed  2:30 PM on review assessment, the patient states that he feels improved.  Repeat auscultation of lungs: Increased air movement throughout, still with coarse inspiratory and expiratory wheezing.  No rales noted.  5:30 PM - patient still feeling improvement of his shortness of breath and cough.  I discussed with him and his wife the pulmonology and cardiology consultations and recommendations.  Patient does feel comfortable with discharge home.  Discussed strict return precautions for any development of worsening chest pain, shortness of breath, large-volume hemoptysis or any other concerning symptoms to the patient verbalized understanding and agreed.  I have independently interpreted the following studies: Labs: Show no evidence of significant leukocytosis, no anemia, hepatic dysfunction.  Renal function is at baseline based on review of prior records.  Patient's BNP is elevated at approximately 6000's, however chest x-ray and CT do not support CHF exacerbation.  CTA of chest shows  Since 02/14/2024, no pulmonary embolism. *  Diffuse bronchial wall thickening with new left lower lobar, segmental, and subsegmental airway impaction as well as subsegmental airway impaction in the lingula and right lower lobe. Findings could be due to infectious/inflammatory airways disease, including possible aspiration. *  No acute airspace disease. *  Borderline enlarged left ventricle with extensive coronary artery atherosclerosis.   This was discussed with pulmonology, who states that this is most consistent with acute COPD exacerbation and recommends  treatment as such.  See  below.   Discussion of Management with other Providers or Support Staff  I discussed the management of this patient with the:  Consultant(s): 2:50 PM pulmonology - case discussed with Dr. Lilton, who states that CT looks consistent with COPD exacerbation, recommends dc with abx, steroid, and duoneb. Does not recommend pulmonary toilet at this time.  5:22 PM cardiology Dr. Raphael.  Discussed patient's presenting complaint, the fact that he was sent from outpatient cardiology to be assessed here in the emergency department.  Discussed my previous consultation with pulmonology and the patient's current lab test, including elevated BNP.  We discussed that the patient is not currently fluid overloaded and his x-ray/CT do not support acute CHF exacerbation currently.  Noted that troponins are negative.  Discussed that the patient is having runs of trigeminy and bigeminy on heart monitor, which were noted on repeat EKG. He states that from cardiology standpoint he does not think the patient needs to be admitted for these findings. Agrees pt should be treated for COPD exacerbation.  Disclaimers: -Portions of this record have been created using Scientist, clinical (histocompatibility and immunogenetics). Dictation errors have been sought, but may not have been identified and corrected. -This note was created in part using the AI scribe software [employer]bridge. While efforts have been made to review and edit the content for accuracy, there may be errors, omissions, or misinterpretations due to automated transcription or summarization.   See chart and resident provider documentation for details.  ____________________________________________     HISTORY     Reason for Visit Cough   HPI  History of Present Illness Chris Washington is a 73 year old male who presents with cough and chest pain. He is accompanied by his wife.  He has had a persistent cough for the past couple of weeks since hospital discharge,  producing yellow mucus and occasionally fresh red blood, last noted three days ago. Chest pain developed this morning while sitting at the office, worsening with coughing and straining. He experiences night sweats, requiring shirt changes, but his temperature remains around 98.24F. He uses albuterol  breathing treatments, with the last treatment this morning, and inhalers, but lacks Duoneb. He takes oxycodone  for chronic pain and has increased pain due to missed back injections.  Patient does note that he occasionally uses nasal canula O2 at 2 L PRN.    Past Medical History:  Diagnosis Date  . COPD (chronic obstructive pulmonary disease) (CMS-HCC)   . Diabetes mellitus (CMS-HCC)    Per patient Type I during OT evaluation  . Hypertension    Per PT during OT Evaluation  . Myocardial infarction (CMS-HCC)    Hx Per pt during OT Evaluation  . Pain and swelling of left knee    Per PT during OT Evaluation- patient states he needs knee replacements    Problem List[1]  Past Surgical History:  Procedure Laterality Date  . BACK SURGERY  2014   Per PT during OT Evaluation  . DISTAL BICEPS TENDON REPAIR Left 11/14/15   Per PT during OT Evaluation  . PR CATH PLACE/CORON ANGIO, IMG SUPER/INTERP,W LEFT HEART VENTRICULOGRAPHY N/A 02/15/2024   Procedure: Left Heart Catheterization;  Surgeon: Patricia Zachary Barter, MD;  Location: [facility] CATH;  Service: Cardiology  . TOTAL KNEE ARTHROPLASTY Left     No current facility-administered medications for this encounter.  Current Outpatient Medications:  .  acetaminophen  (TYLENOL ) 500 MG tablet, Take 2 tablets (1,000 mg total) by mouth daily as needed for pain., Disp: , Rfl:  .  albuterol  HFA 90 mcg/actuation inhaler, INHALE 2 PUFFS BY MOUTH EVERY 4 HOURS AS NEEDED FOR SHORTNESS OF BREATH FOR COUGH, Disp: , Rfl:  .  amoxicillin-clavulanate (AUGMENTIN) 875-125 mg per tablet, Take 1 tablet by mouth two (2) times a day for 7 days., Disp: 14 tablet, Rfl: 0 .   aspirin  81 MG chewable tablet, Chew 1 tablet (81 mg total) daily., Disp: 90 tablet, Rfl: 0 .  atorvastatin  (LIPITOR) 80 MG tablet, Take 1 tablet (80 mg total) by mouth daily., Disp: 90 tablet, Rfl: 0 .  azithromycin  (ZITHROMAX ) 500 MG tablet, Take 1 tablet (500 mg total) by mouth daily., Disp: 5 tablet, Rfl: 0 .  budesonide -formoterol  (SYMBICORT ) 160-4.5 mcg/actuation inhaler, Inhale 2 puffs two (2) times a day., Disp: , Rfl:  .  DULCOLAX, BISACODYL , ORAL, Take 2 tablets by mouth nightly., Disp: , Rfl:  .  empagliflozin (JARDIANCE) 10 mg tablet, Take 1 tablet (10 mg total) by mouth daily., Disp: 90 tablet, Rfl: 0 .  famotidine (PEPCID) 40 MG tablet, Take 1 tablet (40 mg total) by mouth daily., Disp: , Rfl:  .  finasteride  (PROSCAR ) 5 mg tablet, Take 1 tablet (5 mg total) by mouth., Disp: , Rfl:  .  furosemide (LASIX) 20 MG tablet, Take 1 tablet (20 mg total) by mouth daily as needed for swelling (Take as needed for swelling (if weight goes up by 3 pounds in one day or by 5 pounds in 1 week))., Disp: 90 tablet, Rfl: 0 .  ipratropium-albuterol  (DUO-NEB) 0.5-2.5 mg/3 mL nebulizer, Inhale 3 mL by nebulization every six (6) hours as needed., Disp: 90 mL, Rfl: 0 .  ipratropium-albuterol  (DUO-NEB) 0.5-2.5 mg/3 mL nebulizer, Inhale 3 mL by nebulization every six (6) hours for 10 days., Disp: 120 mL, Rfl: 0 .  metoPROLOL  succinate (TOPROL -XL) 200 MG 24 hr tablet, Take 1 tablet (200 mg total) by mouth daily. (Patient taking differently: Take 1 tablet (200 mg total) by mouth daily. Took 1/2 today held last 4 days dt low bp per wife), Disp: 90 tablet, Rfl: 0 .  naloxone  (NARCAN ) 4 mg nasal spray, 1 spray (4 mg total) into each nostril., Disp: , Rfl:  .  nitroglycerin  (NITROSTAT ) 0.4 MG SL tablet, Place 1 tablet (0.4 mg total) under the tongue every five (5) minutes as needed for chest pain. Maximum of 3 doses in 15 minutes., Disp: 25 tablet, Rfl: 0 .  oxyCODONE -acetaminophen  (PERCOCET) 10-325 mg per tablet, Take 1  tablet by mouth every six (6) hours as needed., Disp: , Rfl:  .  pantoprazole  (PROTONIX ) 40 MG tablet, Take 1 tablet (40 mg total) by mouth daily before breakfast., Disp: , Rfl:  .  polyethylene glycol (GLYCOLAX ) 17 gram/dose powder, Take 17 g by mouth daily., Disp: , Rfl:  .  prasugrel  (EFFIENT ) 10 mg tablet, Take 1 tablet (10 mg total) by mouth daily., Disp: 90 tablet, Rfl: 3 .  predniSONE  (DELTASONE ) 20 MG tablet, 3 po daily for 5 days, then 2 po daily for 2 days, then 1 po daily for 2 days, Disp: 20 tablet, Rfl: 0 .  [Paused] spironolactone (ALDACTONE) 50 MG tablet, Take 1 tablet (50 mg total) by mouth daily. And fluid retention (replaces Amlodipine ), Disp: , Rfl:  .  tamsulosin  (FLOMAX ) 0.4 mg capsule, Take 1 capsule (0.4 mg total) by mouth two (2) times a day., Disp: , Rfl:  .  traZODone  (DESYREL ) 100 MG tablet, Take 1 tablet (100 mg total) by mouth., Disp: , Rfl:  .  valsartan (DIOVAN) 40  MG tablet, Take 1 tablet (40 mg total) by mouth daily., Disp: 90 tablet, Rfl: 0  Allergies Patient has no known allergies.  Family History[2]  Social History Short Social History[3]   PHYSICAL EXAM    ED Triage Vitals [03/13/24 1237]  Enc Vitals Group     BP 101/76     Pulse 82     SpO2 Pulse      Resp 20     Temp 36.8 C (98.3 F)     Temp Source Temporal     SpO2 95 %     Weight 72.6 kg (160 lb)     Height 1.765 m (5' 9.5)     Head Circumference      Peak Flow      Pain Score      Pain Loc      Pain Education      Exclude from Growth Chart     Physical Exam   General: Alert, no acute distress. Speaks in full sentences without difficulty. Skin: Warm, dry. Head: Normocephalic, atraumatic. Neck: Trachea midline. No nuchal rigidity or meningismus. Eye: Pupils are equal, round and reactive to light, normal conjunctiva. Ears, nose, mouth and throat: oral mucosa moist, no pharyngeal erythema or exudate. Cardiovascular: Regular rate and rhythm, Normal peripheral perfusion, No  edema. Respiratory: Coarse breath sounds throughout with inspiratory and expiratory wheezing.  No rhonchi or rales noted.  Respirations are non-labored, breath sounds are equal. Chest wall: No deformity. Back: Normal range of motion. Musculoskeletal: Normal ROM, normal strength. No gross deformity noted. Gastrointestinal: Non distended. Neurological: Alert and oriented to person, place, time, and situation, No focal neurological deficit observed. Psychiatric: Cooperative, appropriate mood & affect.    RESULTS    Labs   Results for orders placed or performed during the hospital encounter of 03/13/24  Respiratory Pathogen Panel with COVID (Nasopharyngeal)  Result Value Ref Range   Adenovirus Not Detected Not Detected   Coronavirus HKU1 Not Detected Not Detected   Coronavirus NL63 Not Detected Not Detected   Coronavirus 229E Not Detected Not Detected   Coronavirus OC43 PCR Not Detected Not Detected   Metapneumovirus Not Detected Not Detected   Rhinovirus/Enterovirus Not Detected Not Detected   Influenza A Not Detected Not Detected   Influenza B Not Detected Not Detected   Parainfluenza 1 Not Detected Not Detected   Parainfluenza 2 Not Detected Not Detected   Parainfluenza 3 Not Detected Not Detected   Parainfluenza 4 Not Detected Not Detected   RSV Not Detected Not Detected   Chlamydophila (Chlamydia) pneumoniae Not Detected Not Detected   Mycoplasma pneumoniae Not Detected Not Detected   SARS-CoV-2 PCR Not Detected Not Detected  hsTroponin I (serial 0-2-6H w/ delta)  Result Value Ref Range   hsTroponin I 38 <=53 ng/L  hsTroponin I - 2 Hour  Result Value Ref Range   hsTroponin I 39 <=53 ng/L   delta hsTroponin I 1 <=7 ng/L  ECG 12 Lead  Result Value Ref Range   EKG Systolic BP  mmHg   EKG Diastolic BP  mmHg   EKG Ventricular Rate 82 BPM   EKG Atrial Rate 82 BPM   EKG P-R Interval 236 ms   EKG QRS Duration 86 ms   EKG Q-T Interval 394 ms   EKG QTC Calculation 460 ms    EKG Calculated P Axis -5 degrees   EKG Calculated R Axis 52 degrees   EKG Calculated T Axis 119 degrees   QTC Fredericia 437 ms  ECG 12 Lead  Result Value Ref Range   EKG Systolic BP  mmHg   EKG Diastolic BP  mmHg   EKG Ventricular Rate 65 BPM   EKG Atrial Rate 65 BPM   EKG P-R Interval 260 ms   EKG QRS Duration 96 ms   EKG Q-T Interval 462 ms   EKG QTC Calculation 480 ms   EKG Calculated P Axis 68 degrees   EKG Calculated R Axis 68 degrees   EKG Calculated T Axis 114 degrees   QTC Fredericia 474 ms  ECG 12 Lead  Result Value Ref Range   EKG Systolic BP  mmHg   EKG Diastolic BP  mmHg   EKG Ventricular Rate 65 BPM   EKG Atrial Rate 65 BPM   EKG P-R Interval 260 ms   EKG QRS Duration 96 ms   EKG Q-T Interval 462 ms   EKG QTC Calculation 480 ms   EKG Calculated P Axis 68 degrees   EKG Calculated R Axis 68 degrees   EKG Calculated T Axis 114 degrees   QTC Fredericia 474 ms     Radiology   ECG 12 Lead Result Date: 03/13/2024 SINUS RHYTHM WITH 1ST DEGREE AV BLOCK WITH FREQUENT PREMATURE VENTRICULAR BEATS IN A PATTERN OF BIGEMINY T WAVE ABNORMALITY, CONSIDER LATERAL ISCHEMIA ABNORMAL ECG WHEN COMPARED WITH ECG OF 16-Feb-2024 07:50, PREMATURE ATRIAL BEATS ARE NO LONGER PRESENT PR INTERVAL HAS INCREASED CRITERIA FOR SEPTAL INFARCT  ARE NO LONGER PRESENT  ECG 12 Lead Result Date: 03/13/2024 SINUS RHYTHM WITH 1ST DEGREE AV BLOCK WITH FREQUENT PREMATURE VENTRICULAR BEATS LOW VOLTAGE QRS CANNOT RULE OUT ANTERIOR INFARCT  (CITED ON OR BEFORE 13-Mar-2024) ABNORMAL ECG WHEN COMPARED WITH ECG OF 13-Mar-2024 12:35, PREMATURE VENTRICULAR BEATS ARE NOW PRESENT  ECG 12 Lead Result Date: 03/13/2024 SINUS RHYTHM WITH 1ST DEGREE AV BLOCK WITH FREQUENT PREMATURE VENTRICULAR BEATS LOW VOLTAGE QRS CANNOT RULE OUT ANTERIOR INFARCT  (CITED ON OR BEFORE 13-Mar-2024) ABNORMAL ECG WHEN COMPARED WITH ECG OF 13-Mar-2024 12:35, PREMATURE VENTRICULAR BEATS ARE NOW PRESENT  XR Chest 2 views Result Date:  03/13/2024 EXAM: XR CHEST 2 VIEWS ACCESSION: [id number] UN REPORT DATE: 03/13/2024 2:34 PM CLINICAL INDICATION: COUGH  TECHNIQUE: PA and Lateral Chest Radiographs COMPARISON: XR CHEST 2 VIEWS 02/14/2024 FINDINGS: Stable mild diffuse bronchial wall thickening. No new consolidation. No pleural effusion or pneumothorax. Normal heart size and mediastinal contours.   Stable mild diffuse bronchial wall thickening, likely attributable to patient's known history of reactive airways disease. No new consolidation.   CTA Chest W Contrast Result Date: 03/13/2024 EXAM: CTA CHEST W CONTRAST ACCESSION: [id number] UN REPORT DATE: 03/13/2024 2:21 PM CLINICAL INDICATION: cough, hemoptysis TECHNIQUE: Contiguous axial images were reconstructed through the chest following a single breath hold helical acquisition during the administration of intravenous contrast material. Images were reformatted in the coronal and sagittal planes. MIP slabs were also constructed. COMPARISON: CTA CHEST W CONTRAST 02/14/2024, CT CHEST WO CONTRAST 02/08/2023 FINDINGS: PULMONARY ARTERIES: No embolus in either lung. Main pulmonary artery is normal in size. HEART/VASCULATURE: Borderline left ventricular enlargement. Mild coronary artery calcifications. No pericardial effusion. Normal caliber aorta with mild calcifications. LUNGS/AIRWAYS/PLEURA: Trachea and large airways are patent. Diffuse bronchial wall thickening with new left lower lobe basilar trunk, segmental, and subsegmental airway impaction as well as centrilobular nodularity. New left upper lobe/lingular and right lower lobe subsegmental airway impaction. Moderate centrilobular emphysema and unchanged perivascular cysts. No pleural effusion or pneumothorax. MEDIASTINUM/THORACIC INLET: No enlarged supraclavicular or intrathoracic lymph nodes. A 1.5 cm right thyroid   lobe nodule is better demonstrated on today's study (Series 4, Image 23) but is unchanged compared to 02/08/2023. UPPER ABDOMEN:  Normal. CHEST WALL/BONES: No enlarged axillary lymph nodes. Extensive degenerative changes of the thoracic spine.   *  Since 02/14/2024, no pulmonary embolism. *  Diffuse bronchial wall thickening with new left lower lobar, segmental, and subsegmental airway impaction as well as subsegmental airway impaction in the lingula and right lower lobe. Findings could be due to infectious/inflammatory airways disease, including possible aspiration. *  No acute airspace disease. *  Borderline enlarged left ventricle with extensive coronary artery atherosclerosis.   ECG 12 Lead Result Date: 03/13/2024 SINUS RHYTHM WITH 1ST DEGREE AV BLOCK LOW VOLTAGE QRS CANNOT RULE OUT ANTERIOR INFARCT  , AGE UNDETERMINED T WAVE ABNORMALITY, CONSIDER LATERAL ISCHEMIA ABNORMAL ECG WHEN COMPARED WITH ECG OF 13-Mar-2024 11:27, PREMATURE VENTRICULAR BEATS ARE NO LONGER PRESENT MINIMAL CRITERIA FOR ANTERIOR INFARCT  ARE NOW PRESENT Confirmed by Claudene Legions ([id number]) on 03/13/2024 2:17:33 PM   Medications administered this visit   @MEDADMIN @ Procedures            [1] Patient Active Problem List Diagnosis  . Bilateral sacroiliitis  . Chronic back pain  . Controlled type 2 diabetes mellitus with stage 3 chronic kidney disease, without long-term current use of insulin  (CMS-HCC)  . Essential hypertension  . Left lower lobe pneumonia  . Generalized abdominal pain  . COPD exacerbation    (CMS-HCC)  . Status post total right knee replacement  . Primary osteoarthritis of right knee  . Community acquired pneumonia of left lower lobe of lung  . Nausea & vomiting  . NSTEMI (non-ST elevated myocardial infarction) (CMS-HCC)  . Coronary artery disease involving native heart  . Acute systolic heart failure (CMS-HCC)  . Acute kidney injury superimposed on chronic kidney disease  . NSVT (nonsustained ventricular tachycardia)    (CMS-HCC)  . Premature ventricular contractions (PVCs) (VPCs)  [2] Family History Problem  Relation Age of Onset  . Stroke Neg Hx   [3] Social History Tobacco Use  . Smoking status: Former    Current packs/day: 0.00    Types: Cigarettes    Quit date: 05/31/2004    Years since quitting: 19.7  . Smokeless tobacco: Never  Vaping Use  . Vaping status: Never Used  Substance Use Topics  . Alcohol use: No  . Drug use: No   Georjean Falling [place], [place] 03/13/24 1846

## 2024-03-14 DIAGNOSIS — I4891 Unspecified atrial fibrillation: Secondary | ICD-10-CM | POA: Diagnosis not present

## 2024-03-14 DIAGNOSIS — I472 Ventricular tachycardia, unspecified: Secondary | ICD-10-CM | POA: Diagnosis not present

## 2024-03-14 DIAGNOSIS — I440 Atrioventricular block, first degree: Secondary | ICD-10-CM | POA: Diagnosis not present

## 2024-03-14 DIAGNOSIS — R9431 Abnormal electrocardiogram [ECG] [EKG]: Secondary | ICD-10-CM | POA: Diagnosis not present

## 2024-03-14 DIAGNOSIS — I493 Ventricular premature depolarization: Secondary | ICD-10-CM | POA: Diagnosis not present

## 2024-03-14 DIAGNOSIS — I214 Non-ST elevation (NSTEMI) myocardial infarction: Secondary | ICD-10-CM | POA: Diagnosis not present

## 2024-03-27 DIAGNOSIS — J9611 Chronic respiratory failure with hypoxia: Secondary | ICD-10-CM | POA: Diagnosis not present

## 2024-04-10 DIAGNOSIS — I509 Heart failure, unspecified: Secondary | ICD-10-CM | POA: Diagnosis not present

## 2024-04-10 DIAGNOSIS — I4729 Other ventricular tachycardia: Secondary | ICD-10-CM | POA: Diagnosis not present

## 2024-04-10 DIAGNOSIS — I480 Paroxysmal atrial fibrillation: Secondary | ICD-10-CM | POA: Diagnosis not present

## 2024-04-10 DIAGNOSIS — I214 Non-ST elevation (NSTEMI) myocardial infarction: Secondary | ICD-10-CM | POA: Diagnosis not present

## 2024-04-17 DIAGNOSIS — N1832 Chronic kidney disease, stage 3b: Secondary | ICD-10-CM | POA: Diagnosis not present

## 2024-04-17 DIAGNOSIS — J439 Emphysema, unspecified: Secondary | ICD-10-CM | POA: Diagnosis not present

## 2024-04-17 DIAGNOSIS — I4891 Unspecified atrial fibrillation: Secondary | ICD-10-CM | POA: Diagnosis not present

## 2024-04-17 DIAGNOSIS — I5020 Unspecified systolic (congestive) heart failure: Secondary | ICD-10-CM | POA: Diagnosis not present

## 2024-04-17 DIAGNOSIS — E1122 Type 2 diabetes mellitus with diabetic chronic kidney disease: Secondary | ICD-10-CM | POA: Diagnosis not present

## 2024-04-18 DIAGNOSIS — I4729 Other ventricular tachycardia: Secondary | ICD-10-CM | POA: Diagnosis not present

## 2024-04-26 ENCOUNTER — Emergency Department (HOSPITAL_COMMUNITY)

## 2024-04-26 ENCOUNTER — Other Ambulatory Visit: Payer: Self-pay

## 2024-04-26 ENCOUNTER — Observation Stay (HOSPITAL_COMMUNITY)
Admission: EM | Admit: 2024-04-26 | Discharge: 2024-04-27 | Disposition: A | Discharge status: Home / Self Care | Attending: Internal Medicine | Admitting: Internal Medicine

## 2024-04-26 ENCOUNTER — Encounter (HOSPITAL_COMMUNITY): Payer: Self-pay | Admitting: Emergency Medicine

## 2024-04-26 DIAGNOSIS — Z96653 Presence of artificial knee joint, bilateral: Secondary | ICD-10-CM | POA: Diagnosis not present

## 2024-04-26 DIAGNOSIS — I10 Essential (primary) hypertension: Secondary | ICD-10-CM | POA: Diagnosis present

## 2024-04-26 DIAGNOSIS — Z87891 Personal history of nicotine dependence: Secondary | ICD-10-CM | POA: Diagnosis not present

## 2024-04-26 DIAGNOSIS — R7989 Other specified abnormal findings of blood chemistry: Secondary | ICD-10-CM | POA: Diagnosis not present

## 2024-04-26 DIAGNOSIS — N183 Chronic kidney disease, stage 3 unspecified: Secondary | ICD-10-CM | POA: Diagnosis present

## 2024-04-26 DIAGNOSIS — N400 Enlarged prostate without lower urinary tract symptoms: Secondary | ICD-10-CM | POA: Insufficient documentation

## 2024-04-26 DIAGNOSIS — R778 Other specified abnormalities of plasma proteins: Secondary | ICD-10-CM | POA: Diagnosis not present

## 2024-04-26 DIAGNOSIS — I5020 Unspecified systolic (congestive) heart failure: Secondary | ICD-10-CM | POA: Insufficient documentation

## 2024-04-26 DIAGNOSIS — N179 Acute kidney failure, unspecified: Secondary | ICD-10-CM | POA: Diagnosis not present

## 2024-04-26 DIAGNOSIS — I130 Hypertensive heart and chronic kidney disease with heart failure and stage 1 through stage 4 chronic kidney disease, or unspecified chronic kidney disease: Secondary | ICD-10-CM | POA: Insufficient documentation

## 2024-04-26 DIAGNOSIS — I2510 Atherosclerotic heart disease of native coronary artery without angina pectoris: Secondary | ICD-10-CM | POA: Diagnosis not present

## 2024-04-26 DIAGNOSIS — F32A Depression, unspecified: Secondary | ICD-10-CM | POA: Diagnosis not present

## 2024-04-26 DIAGNOSIS — Z79899 Other long term (current) drug therapy: Secondary | ICD-10-CM | POA: Diagnosis not present

## 2024-04-26 DIAGNOSIS — R079 Chest pain, unspecified: Principal | ICD-10-CM | POA: Insufficient documentation

## 2024-04-26 DIAGNOSIS — I214 Non-ST elevation (NSTEMI) myocardial infarction: Secondary | ICD-10-CM | POA: Insufficient documentation

## 2024-04-26 DIAGNOSIS — N1832 Chronic kidney disease, stage 3b: Secondary | ICD-10-CM | POA: Insufficient documentation

## 2024-04-26 DIAGNOSIS — E1122 Type 2 diabetes mellitus with diabetic chronic kidney disease: Secondary | ICD-10-CM | POA: Diagnosis not present

## 2024-04-26 DIAGNOSIS — I493 Ventricular premature depolarization: Secondary | ICD-10-CM | POA: Diagnosis present

## 2024-04-26 DIAGNOSIS — R0789 Other chest pain: Secondary | ICD-10-CM | POA: Diagnosis not present

## 2024-04-26 DIAGNOSIS — D631 Anemia in chronic kidney disease: Secondary | ICD-10-CM | POA: Diagnosis not present

## 2024-04-26 DIAGNOSIS — I4891 Unspecified atrial fibrillation: Secondary | ICD-10-CM | POA: Diagnosis not present

## 2024-04-26 DIAGNOSIS — J449 Chronic obstructive pulmonary disease, unspecified: Secondary | ICD-10-CM | POA: Insufficient documentation

## 2024-04-26 DIAGNOSIS — Z7984 Long term (current) use of oral hypoglycemic drugs: Secondary | ICD-10-CM | POA: Diagnosis not present

## 2024-04-26 DIAGNOSIS — G894 Chronic pain syndrome: Secondary | ICD-10-CM | POA: Diagnosis present

## 2024-04-26 LAB — URINALYSIS, ROUTINE W REFLEX MICROSCOPIC
Bacteria, UA: NONE SEEN
Bilirubin Urine: NEGATIVE
Glucose, UA: >=500 mg/dL — AB
Hgb urine dipstick: NEGATIVE
Ketones, ur: NEGATIVE mg/dL
Leukocytes,Ua: NEGATIVE
Nitrite: NEGATIVE
Protein, ur: 30 mg/dL — AB
Specific Gravity, Urine: 1.028 (ref 1.005–1.030)
pH: 5 (ref 5.0–8.0)

## 2024-04-26 LAB — TROPONIN I (HIGH SENSITIVITY)
Troponin I (High Sensitivity): 48 ng/L — ABNORMAL HIGH (ref <18)
Troponin I (High Sensitivity): 57 ng/L — ABNORMAL HIGH (ref <18)
Troponin I (High Sensitivity): 63 ng/L — ABNORMAL HIGH (ref <18)

## 2024-04-26 LAB — CBC WITH DIFFERENTIAL/PLATELET
Abs Immature Granulocytes: 0.19 K/uL — ABNORMAL HIGH (ref 0.00–0.07)
Basophils Absolute: 0.1 K/uL (ref 0.0–0.1)
Basophils Relative: 1 %
Eosinophils Absolute: 0.2 K/uL (ref 0.0–0.5)
Eosinophils Relative: 3 %
HCT: 34.4 % — ABNORMAL LOW (ref 39.0–52.0)
Hemoglobin: 10.8 g/dL — ABNORMAL LOW (ref 13.0–17.0)
Immature Granulocytes: 2 %
Lymphocytes Relative: 18 %
Lymphs Abs: 1.6 K/uL (ref 0.7–4.0)
MCH: 30 pg (ref 26.0–34.0)
MCHC: 31.4 g/dL (ref 30.0–36.0)
MCV: 95.6 fL (ref 80.0–100.0)
Monocytes Absolute: 0.9 K/uL (ref 0.1–1.0)
Monocytes Relative: 9 %
Neutro Abs: 6.2 K/uL (ref 1.7–7.7)
Neutrophils Relative %: 67 %
Platelets: 224 K/uL (ref 150–400)
RBC: 3.6 MIL/uL — ABNORMAL LOW (ref 4.22–5.81)
RDW: 14.2 % (ref 11.5–15.5)
WBC: 9.1 K/uL (ref 4.0–10.5)
nRBC: 0 % (ref 0.0–0.2)

## 2024-04-26 LAB — I-STAT CHEM 8, ED
BUN: 22 mg/dL (ref 8–23)
Calcium, Ion: 1.11 mmol/L — ABNORMAL LOW (ref 1.15–1.40)
Chloride: 104 mmol/L (ref 98–111)
Creatinine, Ser: 2.5 mg/dL — ABNORMAL HIGH (ref 0.61–1.24)
Glucose, Bld: 141 mg/dL — ABNORMAL HIGH (ref 70–99)
HCT: 33 % — ABNORMAL LOW (ref 39.0–52.0)
Hemoglobin: 11.2 g/dL — ABNORMAL LOW (ref 13.0–17.0)
Potassium: 3.8 mmol/L (ref 3.5–5.1)
Sodium: 138 mmol/L (ref 135–145)
TCO2: 22 mmol/L (ref 22–32)

## 2024-04-26 LAB — COMPREHENSIVE METABOLIC PANEL WITH GFR
ALT: 10 U/L (ref 0–44)
AST: 16 U/L (ref 15–41)
Albumin: 3.1 g/dL — ABNORMAL LOW (ref 3.5–5.0)
Alkaline Phosphatase: 45 U/L (ref 38–126)
Anion gap: 13 (ref 5–15)
BUN: 22 mg/dL (ref 8–23)
CO2: 20 mmol/L — ABNORMAL LOW (ref 22–32)
Calcium: 8.9 mg/dL (ref 8.9–10.3)
Chloride: 104 mmol/L (ref 98–111)
Creatinine, Ser: 2.31 mg/dL — ABNORMAL HIGH (ref 0.61–1.24)
GFR, Estimated: 29 mL/min — ABNORMAL LOW (ref >60)
Glucose, Bld: 147 mg/dL — ABNORMAL HIGH (ref 70–99)
Potassium: 3.8 mmol/L (ref 3.5–5.1)
Sodium: 137 mmol/L (ref 135–145)
Total Bilirubin: 0.7 mg/dL (ref 0.0–1.2)
Total Protein: 5.8 g/dL — ABNORMAL LOW (ref 6.5–8.1)

## 2024-04-26 LAB — MAGNESIUM: Magnesium: 1.6 mg/dL — ABNORMAL LOW (ref 1.7–2.4)

## 2024-04-26 LAB — BRAIN NATRIURETIC PEPTIDE: B Natriuretic Peptide: 131 pg/mL — ABNORMAL HIGH (ref 0.0–100.0)

## 2024-04-26 MED ORDER — OXYCODONE HCL 5 MG PO TABS
5.0000 mg | ORAL_TABLET | Freq: Four times a day (QID) | ORAL | Status: DC | PRN
  Administered 2024-04-27: 5 mg via ORAL
  Filled 2024-04-26: qty 1

## 2024-04-26 MED ORDER — ATORVASTATIN CALCIUM 80 MG PO TABS: 80.0000 mg | ORAL_TABLET | Freq: Every day | ORAL | Status: DC

## 2024-04-26 MED ORDER — ASPIRIN 81 MG PO CHEW
324.0000 mg | CHEWABLE_TABLET | Freq: Once | ORAL | Status: AC
  Administered 2024-04-26: 324 mg via ORAL
  Filled 2024-04-26: qty 4

## 2024-04-26 MED ORDER — NITROGLYCERIN 0.4 MG SL SUBL: 0.4000 mg | SUBLINGUAL_TABLET | SUBLINGUAL | Status: DC | PRN

## 2024-04-26 MED ORDER — ATORVASTATIN CALCIUM 80 MG PO TABS
80.0000 mg | ORAL_TABLET | Freq: Every day | ORAL | Status: DC
  Administered 2024-04-27 (×2): 80 mg via ORAL
  Filled 2024-04-26 (×2): qty 1

## 2024-04-26 MED ORDER — ASPIRIN 81 MG PO TBEC
81.0000 mg | DELAYED_RELEASE_TABLET | Freq: Every day | ORAL | Status: DC
  Administered 2024-04-27: 81 mg via ORAL
  Filled 2024-04-26: qty 1

## 2024-04-26 MED ORDER — CLOPIDOGREL BISULFATE 75 MG PO TABS
75.0000 mg | ORAL_TABLET | Freq: Every day | ORAL | Status: DC
  Administered 2024-04-27: 75 mg via ORAL
  Filled 2024-04-26: qty 1

## 2024-04-26 MED ORDER — OXYCODONE-ACETAMINOPHEN 5-325 MG PO TABS
1.0000 | ORAL_TABLET | Freq: Four times a day (QID) | ORAL | Status: DC | PRN
  Administered 2024-04-27: 1 via ORAL
  Filled 2024-04-26: qty 1

## 2024-04-26 MED ORDER — CLOPIDOGREL BISULFATE 75 MG PO TABS: 75.0000 mg | ORAL_TABLET | Freq: Every day | ORAL | Status: DC

## 2024-04-26 MED ORDER — LACTATED RINGERS IV BOLUS
500.0000 mL | Freq: Once | INTRAVENOUS | Status: AC
  Administered 2024-04-26: 500 mL via INTRAVENOUS

## 2024-04-26 MED ORDER — PRASUGREL HCL 10 MG PO TABS: 10.0000 mg | ORAL_TABLET | Freq: Every day | ORAL | Status: DC

## 2024-04-26 MED ORDER — PRASUGREL HCL 10 MG PO TABS
60.0000 mg | ORAL_TABLET | Freq: Once | ORAL | Status: DC
  Filled 2024-04-26: qty 6

## 2024-04-26 MED ORDER — MAGNESIUM SULFATE 2 GM/50ML IV SOLN
2.0000 g | Freq: Once | INTRAVENOUS | Status: AC
  Administered 2024-04-26: 2 g via INTRAVENOUS
  Filled 2024-04-26: qty 50

## 2024-04-26 MED ORDER — HEPARIN (PORCINE) 25000 UT/250ML-% IV SOLN
850.0000 [IU]/h | INTRAVENOUS | Status: DC
  Administered 2024-04-26: 1000 [IU]/h via INTRAVENOUS
  Filled 2024-04-26: qty 250

## 2024-04-26 MED ORDER — INSULIN ASPART 100 UNIT/ML IJ SOLN: 0.0000 [IU] | Freq: Three times a day (TID) | INTRAMUSCULAR | Status: DC

## 2024-04-26 MED ORDER — OXYCODONE-ACETAMINOPHEN 10-325 MG PO TABS
1.0000 | ORAL_TABLET | Freq: Four times a day (QID) | ORAL | Status: DC | PRN
Start: 2024-04-26 — End: 2024-04-26

## 2024-04-26 MED ORDER — METOPROLOL SUCCINATE ER 50 MG PO TB24
50.0000 mg | ORAL_TABLET | Freq: Every day | ORAL | Status: DC
  Administered 2024-04-27: 50 mg via ORAL
  Filled 2024-04-26: qty 1

## 2024-04-26 MED ORDER — HEPARIN SODIUM (PORCINE) 5000 UNIT/ML IJ SOLN: 4000.0000 [IU] | Freq: Once | INTRAMUSCULAR | Status: DC

## 2024-04-26 MED ORDER — ENOXAPARIN SODIUM 40 MG/0.4ML IJ SOSY: 40.0000 mg | PREFILLED_SYRINGE | INTRAMUSCULAR | Status: DC

## 2024-04-26 MED ORDER — HEPARIN BOLUS VIA INFUSION
4000.0000 [IU] | Freq: Once | INTRAVENOUS | Status: AC
  Administered 2024-04-26: 4000 [IU] via INTRAVENOUS
  Filled 2024-04-26: qty 4000

## 2024-04-26 MED ORDER — CLOPIDOGREL BISULFATE 75 MG PO TABS
300.0000 mg | ORAL_TABLET | Freq: Once | ORAL | Status: AC
  Administered 2024-04-27: 300 mg via ORAL
  Filled 2024-04-26: qty 4

## 2024-04-26 MED ORDER — INSULIN ASPART 100 UNIT/ML IJ SOLN: 0.0000 [IU] | Freq: Every day | INTRAMUSCULAR | Status: DC

## 2024-04-26 MED ORDER — ONDANSETRON HCL 4 MG/2ML IJ SOLN: 4.0000 mg | Freq: Four times a day (QID) | INTRAMUSCULAR | Status: DC | PRN

## 2024-04-26 MED ORDER — ACETAMINOPHEN 325 MG PO TABS: 650.0000 mg | ORAL_TABLET | ORAL | Status: DC | PRN

## 2024-04-26 NOTE — ED Provider Notes (Signed)
  EMERGENCY DEPARTMENT AT [facility] Provider Note   CSN: [id number] Arrival date & time: 04/26/24  1749     Patient presents with: Chest Pain   Chris Washington is a 73 y.o. male. Hx of COPD, DM, HTN, prior MI s/p 1x stent placed 2 months ago (d/c 02/21/2024), HFrEF 30-35% LVEF on 02/16/2024 presenting with CP since this evening. 4x sublingual NTG w/ mild relief. 2L O2 [place] at baseline for COPD. 81 mg ASA taken today.  History from patient.  Endorses that he has been feeling generally unwell over the past few days, with cough, congestion, and nausea and vomiting.  States that he was feeling much better today, went to his daughters for Thanksgiving, however while he was moving around he felt a crushing chest pain on his chest with rating symptoms to the left arm with associated numbness.  He states when he sat down that this started to feel better, he took 3 nitro tablets with significant improvement, and took a fourth 1 with complete resolution of symptoms.  EMS was called.  He endorses that the chest pain has been stable since then, and does not feel chest pain at this time.  Denies nausea or vomiting at this time, states that his nausea and vomiting has significantly improved and he feels much better.  Denies fever or chills.  Denies shortness of breath right now.  Denies abdominal pain.  S/p LHC on 9/17 with multi-vessel coronary artery disease including 70% mid-RCA stenosis (non-dominant), 60% mid-LAD stenosis (heavy plaque burden on IVUS), and 60% OM1 stenosis (concentric calcium  and moderate plaque burden on IVUS) s/p DES of mid-LAD     Chest Pain      Prior to Admission medications   Medication Sig Start Date End Date Taking? Authorizing Provider  acetaminophen  (TYLENOL ) 500 MG tablet Take 1,000 mg by mouth every 6 (six) hours as needed for mild pain (pain score 1-3).   Yes [provider]  amLODipine  (NORVASC ) 5 MG tablet Take 5 mg by mouth daily. 12/02/23  Yes  [provider]  apixaban (ELIQUIS) 5 MG TABS tablet Take 5 mg by mouth 2 (two) times daily.   Yes [provider]  aspirin  EC 81 MG tablet Take 1 tablet (81 mg total) by mouth 2 (two) times daily. To be taken after surgery to prevent blood clots 03/16/21  Yes Jule Ronal CROME, PA-C  atorvastatin  (LIPITOR) 80 MG tablet Take 80 mg by mouth daily. 02/21/24 05/21/24 Yes [provider]  clopidogrel  (PLAVIX ) 75 MG tablet Take 75 mg by mouth daily. 04/10/24 04/10/25 Yes [provider]  empagliflozin (JARDIANCE) 10 MG TABS tablet Take 10 mg by mouth daily. 02/21/24 05/21/24 Yes [provider]  furosemide (LASIX) 20 MG tablet Take 20 mg by mouth daily as needed for fluid. 02/21/24 05/21/24 Yes [provider]  ipratropium-albuterol  (DUONEB) 0.5-2.5 (3) MG/3ML SOLN Take 3 mLs by nebulization every 6 (six) hours. 03/13/24  Yes [provider]  losartan (COZAAR) 100 MG tablet Take 100 mg by mouth daily. 12/05/23  Yes [provider]  metoprolol  succinate (TOPROL -XL) 50 MG 24 hr tablet Take 50 mg by mouth daily. 04/10/24 05/15/25 Yes [provider]  nitroGLYCERIN  (NITROSTAT ) 0.4 MG SL tablet Place 0.4 mg under the tongue. 02/21/24 02/20/25 Yes [provider]  ondansetron  (ZOFRAN -ODT) 4 MG disintegrating tablet Take 4 mg by mouth 3 (three) times daily as needed. 04/17/24  Yes [provider]  oxyCODONE -acetaminophen  (PERCOCET) 10-325 MG tablet  Take 1 tablet by mouth every 6 (six) hours as needed for pain.   Yes [provider]  prasugrel  (EFFIENT ) 10 MG TABS tablet Take 10 mg by mouth daily. 02/21/24  Yes [provider]  spironolactone (ALDACTONE) 25 MG tablet Take 25 mg by mouth daily. 04/10/24  Yes [provider]  valsartan (DIOVAN) 40 MG tablet Take 40 mg by mouth daily. 02/21/24 05/21/24 Yes [provider]  albuterol  (PROVENTIL ) (2.5 MG/3ML) 0.083% nebulizer solution Take 2.5  mg by nebulization every 4 (four) hours as needed for wheezing or shortness of breath.    [provider]  albuterol  (VENTOLIN  HFA) 108 (90 Base) MCG/ACT inhaler Inhale 2 puffs into the lungs every 4 (four) hours as needed. 03/06/21   [provider]  budesonide -formoterol  (SYMBICORT ) 160-4.5 MCG/ACT inhaler Inhale 2 puffs into the lungs 2 (two) times daily.     [provider]  celecoxib  (CELEBREX ) 200 MG capsule Take 1 capsule (200 mg total) by mouth 2 (two) times daily. 03/23/21   Jerri Kay HERO, MD  cephALEXin  (KEFLEX ) 500 MG capsule Take 1 capsule (500 mg total) by mouth 4 (four) times daily. To be taken after surgery 03/16/21   Jule Ronal CROME, PA-C  cyclobenzaprine  (FLEXERIL ) 10 MG tablet Take 0.5-1 tablets (5-10 mg total) by mouth 2 (two) times daily as needed. 02/08/16   Levora Riggs, PA-C  finasteride  (PROSCAR ) 5 MG tablet Take 5 mg by mouth every evening.  11/27/15   [provider]  lisinopril -hydrochlorothiazide  (PRINZIDE ,ZESTORETIC ) 20-25 MG tablet Take 1 tablet by mouth daily. 11/27/15   [provider]  metFORMIN  (GLUCOPHAGE -XR) 500 MG 24 hr tablet Take 500 mg by mouth 2 (two) times daily.    [provider]  methocarbamol  (ROBAXIN ) 500 MG tablet Take 1 tablet (500 mg total) by mouth 2 (two) times daily as needed. To be taken after surgery 03/16/21   Jule Ronal CROME, PA-C  naloxone  (NARCAN ) nasal spray 4 mg/0.1 mL Place 4 mg into the nose as needed. 11/19/20   [provider]  ondansetron  (ZOFRAN ) 4 MG tablet Take 1 tablet (4 mg total) by mouth every 8 (eight) hours as needed for nausea or vomiting. 03/16/21   Jule Ronal CROME, PA-C  pantoprazole  (PROTONIX ) 40 MG tablet Take 40 mg by mouth daily.    [provider]  tamsulosin  (FLOMAX ) 0.4 MG CAPS Take 1 capsule (0.4 mg total) by mouth every morning. 12/29/12   Mavis Purchase, MD  traZODone  (DESYREL ) 100 MG tablet Take 100 mg by mouth at bedtime. 12/10/15   [provider]    Allergies: Patient has no known allergies.    Review of Systems  Cardiovascular:  Positive for chest pain.    Updated Vital Signs BP (!) 151/82 (BP Location: Left Arm)   Pulse 88   Temp 98.4 F (36.9 C) (Oral)   Resp 20   Ht (P) 5' 10 (1.778 m)   Wt (P) 73.2 kg   SpO2 96%   BMI (P) 23.16 kg/m   Physical Exam Vitals and nursing note reviewed.  Constitutional:      General: He is not in acute distress.    Appearance: He is well-developed. He is ill-appearing.  HENT:     Head: Normocephalic and atraumatic.  Eyes:     Conjunctiva/sclera: Conjunctivae normal.  Cardiovascular:     Rate and Rhythm: Normal rate and regular rhythm.     Heart sounds: Normal heart sounds. No murmur heard. Pulmonary:  Effort: Pulmonary effort is normal. No tachypnea or respiratory distress.     Breath sounds: Normal breath sounds. No decreased breath sounds, wheezing, rhonchi or rales.     Comments: Lungs are clear to auscultation throughout.  Patient on 2 L O2 [place] satting well. Abdominal:     Palpations: Abdomen is soft. There is no mass.     Tenderness: There is no abdominal tenderness. There is no guarding or rebound.  Musculoskeletal:        General: No swelling.     Cervical back: Neck supple.     Right lower leg: No tenderness. No edema.     Left lower leg: No tenderness. No edema.     Comments: No appreciable edema bilateral lower extremities, patient appears emaciated  Skin:    General: Skin is warm and dry.     Capillary Refill: Capillary refill takes less than 2 seconds.  Neurological:     General: No focal deficit present.     Mental Status: He is alert and oriented to person, place, and time.  Psychiatric:        Mood and Affect: Mood normal.     (all labs ordered are listed, but only abnormal results are displayed) Labs Reviewed  CBC WITH DIFFERENTIAL/PLATELET - Abnormal; Notable for the following components:      Result Value   RBC 3.60 (*)     Hemoglobin 10.8 (*)    HCT 34.4 (*)    Abs Immature Granulocytes 0.19 (*)    All other components within normal limits  COMPREHENSIVE METABOLIC PANEL WITH GFR - Abnormal; Notable for the following components:   CO2 20 (*)    Glucose, Bld 147 (*)    Creatinine, Ser 2.31 (*)    Total Protein 5.8 (*)    Albumin 3.1 (*)    GFR, Estimated 29 (*)    All other components within normal limits  MAGNESIUM  - Abnormal; Notable for the following components:   Magnesium  1.6 (*)    All other components within normal limits  BRAIN NATRIURETIC PEPTIDE - Abnormal; Notable for the following components:   B Natriuretic Peptide 131.0 (*)    All other components within normal limits  URINALYSIS, ROUTINE W REFLEX MICROSCOPIC - Abnormal; Notable for the following components:   Glucose, UA >=500 (*)    Protein, ur 30 (*)    All other components within normal limits  CBC - Abnormal; Notable for the following components:   RBC 3.73 (*)    Hemoglobin 11.4 (*)    HCT 34.8 (*)    All other components within normal limits  I-STAT CHEM 8, ED - Abnormal; Notable for the following components:   Creatinine, Ser 2.50 (*)    Glucose, Bld 141 (*)    Calcium , Ion 1.11 (*)    Hemoglobin 11.2 (*)    HCT 33.0 (*)    All other components within normal limits  TROPONIN I (HIGH SENSITIVITY) - Abnormal; Notable for the following components:   Troponin I (High Sensitivity) 48 (*)    All other components within normal limits  TROPONIN I (HIGH SENSITIVITY) - Abnormal; Notable for the following components:   Troponin I (High Sensitivity) 57 (*)    All other components within normal limits  TROPONIN I (HIGH SENSITIVITY) - Abnormal; Notable for the following components:   Troponin I (High Sensitivity) 63 (*)    All other components within normal limits  GLUCOSE, CAPILLARY  HEMOGLOBIN A1C  CREATININE, SERUM  HEPARIN  LEVEL (  UNFRACTIONATED)  APTT  TROPONIN I (HIGH SENSITIVITY)    EKG: EKG  Interpretation Date/Time:  Thursday April 26 2024 17:53:15 EST Ventricular Rate:  75 PR Interval:  198 QRS Duration:  92 QT Interval:  381 QTC Calculation: 426 R Axis:   21  Text Interpretation: Sinus rhythm Ventricular premature complex Low voltage, extremity and precordial leads Confirmed by Simon Rea [id number]) on 04/26/2024 5:56:31 PM  Radiology: [facility] Chest Portable 1 View Result Date: 04/26/2024 CLINICAL DATA:  Chest pain, history of coronary artery disease EXAM: PORTABLE CHEST 1 VIEW COMPARISON:  02/07/2016 FINDINGS: 2 frontal views of the chest demonstrate an unremarkable cardiac silhouette. Patchy consolidation at the lung bases, favor atelectasis. No effusion or pneumothorax. No acute bony abnormalities. IMPRESSION: 1. Bibasilar hypoventilatory changes. No acute intrathoracic process. Electronically Signed   By: Ozell Daring M.D.   On: 04/26/2024 18:27     Procedures   Medications Ordered in the ED  metoprolol  succinate (TOPROL -XL) 24 hr tablet 50 mg (has no administration in time range)  nitroGLYCERIN  (NITROSTAT ) SL tablet 0.4 mg (has no administration in time range)  acetaminophen  (TYLENOL ) tablet 650 mg (has no administration in time range)  ondansetron  (ZOFRAN ) injection 4 mg (has no administration in time range)  insulin  aspart (novoLOG ) injection 0-9 Units (has no administration in time range)  insulin  aspart (novoLOG ) injection 0-5 Units ( Subcutaneous Not Given 04/27/24 0011)  aspirin  EC tablet 81 mg (has no administration in time range)  heparin  ADULT infusion 100 units/mL (25000 units/250mL) (1,000 Units/hr Intravenous New Bag/Given 04/26/24 2256)  atorvastatin  (LIPITOR) tablet 80 mg (80 mg Oral Given 04/27/24 0009)  clopidogrel  (PLAVIX ) tablet 75 mg (has no administration in time range)  oxyCODONE -acetaminophen  (PERCOCET/ROXICET) 5-325 MG per tablet 1 tablet (has no administration in time range)    And  oxyCODONE  (Oxy IR/ROXICODONE ) immediate release tablet 5  mg (has no administration in time range)  fluticasone furoate-vilanterol (BREO ELLIPTA) 200-25 MCG/ACT 1 puff (has no administration in time range)  albuterol  (PROVENTIL ) (2.5 MG/3ML) 0.083% nebulizer solution 3 mL (has no administration in time range)  ipratropium-albuterol  (DUONEB) 0.5-2.5 (3) MG/3ML nebulizer solution 3 mL (has no administration in time range)  aspirin  chewable tablet 324 mg (324 mg Oral Given 04/26/24 1801)  lactated ringers  bolus 500 mL (0 mLs Intravenous Stopped 04/26/24 2003)  magnesium  sulfate IVPB 2 g 50 mL (0 g Intravenous Stopped 04/26/24 2300)  heparin  bolus via infusion 4,000 Units (4,000 Units Intravenous Bolus from Bag 04/26/24 2254)  clopidogrel  (PLAVIX ) tablet 300 mg (300 mg Oral Given 04/27/24 0010)    Clinical Course as of 04/27/24 0106  Thu Apr 26, 2024  2233 Stopped taking prasugrel  1 week ago, sim symptoms to prior, rec start on heparin  and prasugrel . Plan to cath tomorrow, npo at midnight.  [BS]    Clinical Course User Index [BS] Arlee Katz, MD                                 Medical Decision Making Amount and/or Complexity of Data Reviewed Labs: ordered. Radiology: ordered.  Risk OTC drugs. Prescription drug management. Decision regarding hospitalization.   Based on patient presentation, history, evaluation, high suspicion for NSTEMI, given ASA, as well as started on heparin  after discussion with cardiology.  Patient stopped taking his prasugrel  1 week ago, with new onset of symptoms there is significant concern for ACS at this time, therefore patient will undergo cardiac catheterization tomorrow.  Heparin   started at this time.  Discussed with hospitalist team, agreeable to admission with plan to undergo cardiac catheterization with cardiology tomorrow.  Has remained overall hemodynamically stable, pain-free throughout time in the ED.     Final diagnoses:  Elevated troponin  NSTEMI (non-ST elevated myocardial infarction) (HCC)  AKI  (acute kidney injury)  Hypomagnesemia    ED Discharge Orders     None          Arlee Katz, MD 04/27/24 0106    Simon Lavonia SAILOR, MD 04/27/24 2228

## 2024-04-26 NOTE — H&P (Signed)
 History and Physical    Patient: Chris Washington FMW:[id number] DOB: 20-Mar-1951 DOA: 04/26/2024 DOS: the patient was seen and examined on 04/26/2024 PCP: Montey Lot, PA-C  Patient coming from: Home  Chief Complaint:  Chief Complaint  Patient presents with   Chest Pain   HPI: Chris Washington is a 73 y.o. male with medical history significant of coronary artery disease status post PCI to mid LAD in September this year when he had NSTEMI, also COPD on 2 L at home, type 2 diabetes, essential hypertension, ischemic cardiomyopathy with EF of 30 to 35%, BPH, who was last evaluation was at [facility] on November 12.  After his stent patient was placed on Eliquis as well as Plavix  with aspirin .  For the last 8 days he has not taking any of those because he bumped his head and ambulate.  2 days ago he started feeling chest pain nausea and abdominal pain.  The abdominal pain was periumbilical which is gone.  Today however while sitting down he started having another retrosternal pressure-like pain similar to when he had his heart attack.  He was rated as 10 out of 10 and radiating to the left arm.  He also had diaphoresis.  He did not vomit but has some mild nausea.  His last MRI in September was similar.  Patient took 3 sublingual nitroglycerin  with some mild improvement.  The pain persisted so he came to the ER.  From chart review on Care Everywhere it appears patient was discharged on Plavix  and not prasugrel .  He stopped taking it only aspirin .  At this point his troponin is minimally elevated at 57 and 48.  He has been admitted to the hospital for evaluation and treatment  Review of Systems: As mentioned in the history of present illness. All other systems reviewed and are negative. Past Medical History:  Diagnosis Date   Arthritis    takes gabapentin  for chronic knee pain   Basal cell carcinoma of skin of face    left   CKD (chronic kidney disease)    COPD (chronic obstructive pulmonary disease) (HCC)     Depression    Diabetes mellitus without complication (HCC)    Type II   DOE (dyspnea on exertion)    Dysrhythmia    PVCs, saw cardiologist Oneil Parchment, MD in 2017   Enlarged prostate    Hard of hearing    History of hiatal hernia 2019   History of kidney stones    History of pneumonia    Hypercholesteremia    Hypertension    Nocturia    controlled with medicines   Past Surgical History:  Procedure Laterality Date   BACK SURGERY  2014   lumbar laminectomy   BASAL CELL CARCINOMA EXCISION     left side of face   CARDIAC CATHETERIZATION  05/14/04   normal coronaries, LV systolic function, renals, abdominal aorta (Dr. Elsie Ona, [facility])   COLONOSCOPY     DISTAL BICEPS TENDON REPAIR Left 11/14/2015   Procedure: LEFT DISTAL BICEPS TENDON REPAIR;  Surgeon: Kay CHRISTELLA Cummins, MD;  Location: [facility];  Service: Orthopedics;  Laterality: Left;   TOTAL KNEE ARTHROPLASTY Left 01/08/2016   Procedure: LEFT TOTAL KNEE ARTHROPLASTY;  Surgeon: Kay CHRISTELLA Cummins, MD;  Location: [facility] OR;  Service: Orthopedics;  Laterality: Left;   TOTAL KNEE ARTHROPLASTY Right 03/23/2021   Procedure: RIGHT TOTAL KNEE ARTHROPLASTY;  Surgeon: Cummins Kay CHRISTELLA, MD;  Location: [facility] OR;  Service: Orthopedics;  Laterality: Right;  Social History:  reports that he quit smoking about 19 years ago. His smoking use included cigarettes. He has never used smokeless tobacco. He reports current drug use. Drug: Marijuana. He reports that he does not drink alcohol.  No Known Allergies  History reviewed. No pertinent family history.  Prior to Admission medications   Medication Sig Start Date End Date Taking? Authorizing Provider  amLODipine  (NORVASC ) 5 MG tablet Take 5 mg by mouth daily. 12/02/23  Yes [provider]  apixaban (ELIQUIS) 5 MG TABS tablet Take 5 mg by mouth 2 (two) times daily.   Yes [provider]  atorvastatin  (LIPITOR) 80 MG tablet Take 80 mg by mouth daily. 02/21/24 05/21/24 Yes [provider]  clopidogrel  (PLAVIX ) 75 MG tablet Take 75 mg by mouth daily. 04/10/24 04/10/25 Yes [provider]  empagliflozin (JARDIANCE) 10 MG TABS tablet Take 10 mg by mouth daily. 02/21/24 05/21/24 Yes [provider]  furosemide (LASIX) 20 MG tablet Take 20 mg by mouth daily as needed for fluid. 02/21/24 05/21/24 Yes [provider]  ipratropium-albuterol  (DUONEB) 0.5-2.5 (3) MG/3ML SOLN Take 3 mLs by nebulization every 6 (six) hours. 03/13/24  Yes [provider]  losartan (COZAAR) 100 MG tablet Take 100 mg by mouth daily. 12/05/23  Yes [provider]  metoprolol  succinate (TOPROL -XL) 50 MG 24 hr tablet Take 50 mg by mouth daily. 04/10/24 05/15/25 Yes [provider]  nitroGLYCERIN  (NITROSTAT ) 0.4 MG SL tablet Place 0.4 mg under the tongue. 02/21/24 02/20/25 Yes [provider]  ondansetron  (ZOFRAN -ODT) 4 MG disintegrating tablet Take 4 mg by mouth 3 (three) times daily as needed. 04/17/24  Yes [provider]  oxyCODONE -acetaminophen  (PERCOCET) 10-325 MG tablet Take 1 tablet by mouth every 6 (six) hours as needed for pain.   Yes [provider]  prasugrel  (EFFIENT ) 10 MG TABS tablet Take 10 mg by mouth daily. 02/21/24  Yes [provider]  spironolactone (ALDACTONE) 25 MG tablet Take 25 mg by mouth daily. 04/10/24  Yes [provider]  valsartan (DIOVAN) 40 MG tablet Take 40 mg by mouth daily. 02/21/24 05/21/24 Yes [provider]  acetaminophen  (TYLENOL ) 500 MG tablet Take 1,000 mg by mouth.    [provider]  albuterol  (PROVENTIL ) (2.5 MG/3ML) 0.083% nebulizer solution Take 2.5 mg by nebulization every 4 (four) hours as needed for wheezing or shortness of breath.    [provider]  albuterol  (VENTOLIN  HFA) 108 (90 Base) MCG/ACT inhaler Inhale 2 puffs into the lungs every 4 (four) hours as needed. 03/06/21   [provider]  aspirin  EC 81 MG tablet Take 1 tablet  (81 mg total) by mouth 2 (two) times daily. To be taken after surgery to prevent blood clots 03/16/21   Jule Ronal CROME, PA-C  budesonide -formoterol  (SYMBICORT ) 160-4.5 MCG/ACT inhaler Inhale 2 puffs into the lungs 2 (two) times daily.     [provider]  celecoxib  (CELEBREX ) 200 MG capsule Take 1 capsule (200 mg total) by mouth 2 (two) times daily. 03/23/21   Jerri Kay HERO, MD  cephALEXin  (KEFLEX ) 500 MG capsule Take 1 capsule (500 mg total) by mouth 4 (four) times daily. To be taken after surgery 03/16/21   Jule Ronal CROME, PA-C  cyclobenzaprine  (FLEXERIL ) 10 MG tablet Take 0.5-1 tablets (5-10 mg total) by mouth 2 (two) times daily as needed. 02/08/16   Levora Riggs, PA-C  finasteride  (PROSCAR ) 5 MG tablet Take 5 mg by mouth every evening.  11/27/15   [provider]  lisinopril -hydrochlorothiazide  (PRINZIDE ,ZESTORETIC ) 20-25 MG tablet Take 1 tablet by mouth daily. 11/27/15   [provider]  metFORMIN  (GLUCOPHAGE -XR) 500 MG 24 hr tablet Take 500 mg by mouth 2 (two) times daily.    [provider]  methocarbamol  (ROBAXIN ) 500 MG tablet Take 1 tablet (500 mg total) by mouth 2 (two) times daily as needed. To be taken after surgery 03/16/21   Jule Ronal CROME, PA-C  naloxone  (NARCAN ) nasal spray 4 mg/0.1 mL Place 4 mg into the nose as needed. 11/19/20   [provider]  ondansetron  (ZOFRAN ) 4 MG tablet Take 1 tablet (4 mg total) by mouth every 8 (eight) hours as needed for nausea or vomiting. 03/16/21   Jule Ronal CROME, PA-C  pantoprazole  (PROTONIX ) 40 MG tablet Take 40 mg by mouth daily.    [provider]  tamsulosin  (FLOMAX ) 0.4 MG CAPS Take 1 capsule (0.4 mg total) by mouth every morning. 12/29/12   Mavis Purchase, MD  traZODone  (DESYREL ) 100 MG tablet Take 100 mg by mouth at bedtime. 12/10/15   [provider]    Physical Exam: Vitals:   04/26/24 1754 04/26/24 1800 04/26/24 1830 04/26/24 2000  BP: 102/70 103/70 105/73 124/81   Pulse: 79 80 (!) 46 (!) 40  Resp: 16 20 20 19   Temp: 98 F (36.7 C)     TempSrc: Oral     SpO2: 95% 98% 96% 99%  Weight:      Height:       Constitutional: Chronically ill looking no distress NAD, calm, comfortable Eyes: PERRL, lids and conjunctivae normal ENMT: Mucous membranes are moist. Posterior pharynx clear of any exudate or lesions.Normal dentition.  Neck: normal, supple, no masses, no thyromegaly Respiratory: clear to auscultation bilaterally, no wheezing, no crackles. Normal respiratory effort. No accessory muscle use.  Cardiovascular: Regular rate and rhythm, no murmurs / rubs / gallops. No extremity edema. 2+ pedal pulses. No carotid bruits.  Abdomen: no tenderness, no masses palpated. No hepatosplenomegaly. Bowel sounds positive.  Musculoskeletal: Good range of motion, no joint swelling or tenderness, Skin: no rashes, lesions, ulcers. No induration Neurologic: CN 2-12 grossly intact. Sensation intact, DTR normal. Strength 5/5 in all 4.  Psychiatric: Normal judgment and insight. Alert and oriented x 3. Normal mood  Data Reviewed:  Blood pressure 102/70, pulse 79 respiratory 16 oxygen  sat 95% on 2 L, glucose is 141 creatinine 2.5 BUN 22.  Hemoglobin is 10.8 urinalysis essentially negative.  Chest x-ray showed bibasilar changes.  EKG shows normal sinus rhythm with PVCs.  Rate of 75.  Low voltage.  Assessment and Plan:  #1 chest pain: Possibly early NSTEMI.  Patient has noncompliance with Plavix  after his stent may be responsible.  At this point we will admit the patient.  Cardiology consulted.  Aspirin  and Plavix  restarted.  No longer on Eliquis or Effient .  Echocardiogram been ordered.  Heparin  drip ordered.  Will follow cardiologist recommendation.  #2 type 2 diabetes: Continue with sliding scale insulin .  #3 COPD: No acute exacerbation.  #4 chronic kidney disease stage III: Continue to monitor  #5 essential hypertension: Continue blood pressure medications.  #6  anemia of chronic disease: Continue to monitor H&H  #7 depression: Continue monitor    Advance Care Planning:   Code Status: Full Code   Consults: Cardiology, Dr. Otelia  Family Communication: Wife at bedside  Severity of Illness: The appropriate patient status for this patient is OBSERVATION. Observation status is judged to be reasonable and necessary in order to  provide the required intensity of service to ensure the patient's safety. The patient's presenting symptoms, physical exam findings, and initial radiographic and laboratory data in the context of their medical condition is felt to place them at decreased risk for further clinical deterioration. Furthermore, it is anticipated that the patient will be medically stable for discharge from the hospital within 2 midnights of admission.   AuthorBETHA SIM KNOLL, MD 04/26/2024 10:22 PM  For on call review [email or web address].

## 2024-04-26 NOTE — Progress Notes (Signed)
 PHARMACY - ANTICOAGULATION CONSULT NOTE  Pharmacy Consult for Heparin  (Holding Apixaban) Indication: chest pain/ACS, history of afib  No Known Allergies  Patient Measurements: Height: (P) 5' 10 (177.8 cm) Weight: (P) 73.2 kg (161 lb 6 oz) IBW/kg (Calculated) : (P) 73 HEPARIN  DW (KG): (P) 73.2  Vital Signs: Temp: 98.4 F (36.9 C) (11/27 2340) Temp Source: Oral (11/27 2340) BP: 151/82 (11/27 2340) Pulse Rate: 88 (11/27 2340)  Labs: Recent Labs    04/26/24 1758 04/26/24 1819 04/26/24 2019 04/26/24 2257  HGB 10.8* 11.2*  --   --   HCT 34.4* 33.0*  --   --   PLT 224  --   --   --   CREATININE 2.31* 2.50*  --   --   TROPONINIHS 48*  --  57* 63*    Estimated Creatinine Clearance: 27.2 mL/min (A) (by C-G formula based on SCr of 2.5 mg/dL (H)).   Medical History: Past Medical History:  Diagnosis Date   Arthritis    takes gabapentin  for chronic knee pain   Basal cell carcinoma of skin of face    left   CKD (chronic kidney disease)    COPD (chronic obstructive pulmonary disease) (HCC)    Depression    Diabetes mellitus without complication (HCC)    Type II   DOE (dyspnea on exertion)    Dysrhythmia    PVCs, saw cardiologist Oneil Parchment, MD in 2017   Enlarged prostate    Hard of hearing    History of hiatal hernia 2019   History of kidney stones    History of pneumonia    Hypercholesteremia    Hypertension    Nocturia    controlled with medicines    Assessment: 73 y/o M presents to the ED with chest pain, on apixaban PTA for atrial fibrillation. Per the cardiology note, it looks like he may have recently stopped apixaban, however, he told the RN that his last dose of apixaban was 11/27 at 0900. Either way, ok to start heparin  now. Will start off checking aPTT in addition to heparin  level. The first heparin  level will give us  some guidance about apixaban compliance. Above labs reviewed.   Goal of Therapy:  Heparin  level 0.3-0.7 units/ml aPTT 66-102  seconds Monitor platelets by anticoagulation protocol: Yes   Plan:  Heparin  4000 units BOLUS Start heparin  drip at 1000 units/hr Heparin  level and aPTT in 8 hours Daily CBC/Heparin  level/aPTT Monitor for bleeding  Lynwood Mckusick, PharmD, BCPS Clinical Pharmacist Phone: [phone]

## 2024-04-26 NOTE — Consult Note (Addendum)
 Cardiology Consultation   Patient ID: EWALD BEG MRN: [id number]; DOB: 1950-06-20  Admit date: 04/26/2024 Date of Consult: 04/26/2024  PCP:  Montey Lot, PA-C   [facility] Providers Cardiologist:  None        Patient Profile: Chris Washington is a 73 y.o. male with a hx of CAD s/p PCI to mLAD in 01/2024 in the setting of NSTEMI, COPD on 2L O2 at home, T2DM, HTN, ischemic HFrEF (LVEF 30-35%), BPH  who is being seen 04/26/2024 for the evaluation of chest pain at the request of ED.  History of Present Illness: Mr. Prom reports that 2-3 days ago, he started to feel unwell with periumbilical abdominal pain, nausea, and vomiting that have resolved today. Later in the day, he was sitting down when he started to have retrosternal, pressure-like, severe, chest pain with radiation to the left arm associated with diaphoresis. No nausea, vomiting, or shortness of breath. This pain was similar to the MI pain in 01/2024 but was more severe this time.  He took 3 SL NG with improvement in his chest pain ~2- minutes following the third dose of Sl NG.   He was discharged on aspirin  and prasugrel  following his PCI in 01/2024; as per [facility] notes, he was recently switched from prasugrel  to palvix given initation of apixaban in the setting of a new diagnosis of AF. As per the patient and his wife, the only blood thinner he's taking now is aspirin  as he stopped the other blood thinner due to easy bleeding from right hand wounds. It's unclear if he's taking apixaban.  With EMS, he received 324 mg of aspirin . In the ED, EKG showed normal sinus rhythm, no acute ischemic changes, and 1 PVC. Troponin 48 =>57. Teletrmy showed frequent PVCs. He's currently chest pain free.   Past Medical History:  Diagnosis Date   Arthritis    takes gabapentin  for chronic knee pain   Basal cell carcinoma of skin of face    left   CKD (chronic kidney disease)    COPD (chronic obstructive pulmonary disease) (HCC)     Depression    Diabetes mellitus without complication (HCC)    Type II   DOE (dyspnea on exertion)    Dysrhythmia    PVCs, saw cardiologist Oneil Parchment, MD in 2017   Enlarged prostate    Hard of hearing    History of hiatal hernia 2019   History of kidney stones    History of pneumonia    Hypercholesteremia    Hypertension    Nocturia    controlled with medicines    Past Surgical History:  Procedure Laterality Date   BACK SURGERY  2014   lumbar laminectomy   BASAL CELL CARCINOMA EXCISION     left side of face   CARDIAC CATHETERIZATION  05/14/04   normal coronaries, LV systolic function, renals, abdominal aorta (Dr. Elsie Ona, [facility])   COLONOSCOPY     DISTAL BICEPS TENDON REPAIR Left 11/14/2015   Procedure: LEFT DISTAL BICEPS TENDON REPAIR;  Surgeon: Kay CHRISTELLA Cummins, MD;  Location: [facility];  Service: Orthopedics;  Laterality: Left;   TOTAL KNEE ARTHROPLASTY Left 01/08/2016   Procedure: LEFT TOTAL KNEE ARTHROPLASTY;  Surgeon: Kay CHRISTELLA Cummins, MD;  Location: [facility] OR;  Service: Orthopedics;  Laterality: Left;   TOTAL KNEE ARTHROPLASTY Right 03/23/2021   Procedure: RIGHT TOTAL KNEE ARTHROPLASTY;  Surgeon: Cummins Kay CHRISTELLA, MD;  Location: [facility] OR;  Service: Orthopedics;  Laterality: Right;  Home Medications:  Prior to Admission medications   Medication Sig Start Date End Date Taking? Authorizing Provider  albuterol  (PROVENTIL ) (2.5 MG/3ML) 0.083% nebulizer solution Take 2.5 mg by nebulization every 4 (four) hours as needed for wheezing or shortness of breath.    [provider]  albuterol  (VENTOLIN  HFA) 108 (90 Base) MCG/ACT inhaler Inhale 2 puffs into the lungs every 4 (four) hours as needed. 03/06/21   [provider]  aspirin  EC 81 MG tablet Take 1 tablet (81 mg total) by mouth 2 (two) times daily. To be taken after surgery to prevent blood clots 03/16/21   Jule Ronal CROME, PA-C  budesonide -formoterol  (SYMBICORT ) 160-4.5 MCG/ACT inhaler Inhale 2 puffs  into the lungs 2 (two) times daily.     [provider]  celecoxib  (CELEBREX ) 200 MG capsule Take 1 capsule (200 mg total) by mouth 2 (two) times daily. 03/23/21   Jerri Kay HERO, MD  cephALEXin  (KEFLEX ) 500 MG capsule Take 1 capsule (500 mg total) by mouth 4 (four) times daily. To be taken after surgery 03/16/21   Jule Ronal CROME, PA-C  cyclobenzaprine  (FLEXERIL ) 10 MG tablet Take 0.5-1 tablets (5-10 mg total) by mouth 2 (two) times daily as needed. 02/08/16   Levora Riggs, PA-C  finasteride  (PROSCAR ) 5 MG tablet Take 5 mg by mouth every evening.  11/27/15   [provider]  lisinopril -hydrochlorothiazide  (PRINZIDE ,ZESTORETIC ) 20-25 MG tablet Take 1 tablet by mouth daily. 11/27/15   [provider]  metFORMIN  (GLUCOPHAGE -XR) 500 MG 24 hr tablet Take 500 mg by mouth 2 (two) times daily.    [provider]  methocarbamol  (ROBAXIN ) 500 MG tablet Take 1 tablet (500 mg total) by mouth 2 (two) times daily as needed. To be taken after surgery 03/16/21   Jule Ronal CROME, PA-C  naloxone  (NARCAN ) nasal spray 4 mg/0.1 mL Place 4 mg into the nose as needed. 11/19/20   [provider]  ondansetron  (ZOFRAN ) 4 MG tablet Take 1 tablet (4 mg total) by mouth every 8 (eight) hours as needed for nausea or vomiting. 03/16/21   Jule Ronal CROME, PA-C  tamsulosin  (FLOMAX ) 0.4 MG CAPS Take 1 capsule (0.4 mg total) by mouth every morning. 12/29/12   Mavis Purchase, MD  traZODone  (DESYREL ) 100 MG tablet Take 100 mg by mouth at bedtime. 12/10/15   [provider]    Scheduled Meds:  [START ON 04/27/2024] aspirin  EC  81 mg Oral Daily   heparin   4,000 Units Intravenous Once   Continuous Infusions:  heparin      PRN Meds:   Allergies:   No Known Allergies  Social History:   Social History   Socioeconomic History   Marital status: Married    Spouse name: Not on file   Number of children: Not on file   Years of education: Not on file   Highest education level:  Not on file  Occupational History   Not on file  Tobacco Use   Smoking status: Former    Current packs/day: 0.00    Types: Cigarettes    Quit date: 05/31/2004    Years since quitting: 19.9   Smokeless tobacco: Never  Vaping Use   Vaping status: Never Used  Substance and Sexual Activity   Alcohol use: No   Drug use: Yes    Types: Marijuana    Comment: occasional   Sexual activity: Not on file  Other Topics Concern   Not on file  Social History Narrative   Not on file  Social Drivers of Corporate Investment Banker Strain: Low Risk (02/20/2024)   Received from [facility]   Overall Financial Resource Strain (CARDIA)    How hard is it for you to pay for the very basics like food, housing, medical care, and heating?: Not very hard  Food Insecurity: No Food Insecurity (02/20/2024)   [facility]   Hunger Vital Sign    Within the past 12 months, you worried that your food would run out before you got the money to buy more.: Never true    Within the past 12 months, the food you bought just didn't last and you didn't have money to get more.: Never true  Transportation Needs: No Transportation Needs (02/20/2024)   [facility]   PRAPARE - Transportation    Lack of Transportation (Medical): No    Lack of Transportation (Non-Medical): No  Physical Activity: Sufficiently Active (10/28/2022)   Received from [facility]   Exercise Vital Sign    On average, how many days per week do you engage in moderate to strenuous exercise (like a brisk walk)?: 7 days    On average, how many minutes do you engage in exercise at this level?: 60 min  Stress: No Stress Concern Present (10/28/2022)   Received from [facility] - Occupational Stress Questionnaire    Feeling of Stress : Not at all  Social Connections: Moderately Integrated (10/28/2022)   Received from [facility]   Social Connection and Isolation  Panel    In a typical week, how many times do you talk on the phone with family, friends, or neighbors?: Once a week    How often do you get together with friends or relatives?: More than three times a week    How often do you attend church or religious services?: More than 4 times per year    Do you belong to any clubs or organizations such as church groups, unions, fraternal or athletic groups, or school groups?: No    How often do you attend meetings of the clubs or organizations you belong to?: Never    Are you married, widowed, divorced, separated, never married, or living with a partner?: Married  Intimate Partner Violence: Not At Risk (10/28/2022)   Received from [facility]   Humiliation, Afraid, Rape, and Kick questionnaire    Within the last year, have you been afraid of your partner or ex-partner?: No    Within the last year, have you been humiliated or emotionally abused in other ways by your partner or ex-partner?: No    Within the last year, have you been kicked, hit, slapped, or otherwise physically hurt by your partner or ex-partner?: No    Within the last year, have you been raped or forced to have any kind of sexual activity by your partner or ex-partner?: No    Family History:   History reviewed. No pertinent family history.   ROS:  Please see the history of present illness.   All other ROS reviewed and negative.     Physical Exam/Data: Vitals:   04/26/24 1754 04/26/24 1800 04/26/24 1830 04/26/24 2000  BP: 102/70 103/70 105/73 124/81  Pulse: 79 80 (!) 46 (!) 40  Resp: 16 20 20 19   Temp: 98 F (36.7 C)     TempSrc: Oral     SpO2: 95% 98% 96% 99%  Weight:  Height:        Intake/Output Summary (Last 24 hours) at 04/26/2024 2249 Last data filed at 04/26/2024 2003 Gross per 24 hour  Intake 500 ml  Output --  Net 500 ml      04/26/2024    5:52 PM 03/23/2021    6:01 AM 03/19/2021    1:01 PM  Last 3 Weights  Weight (lbs) 170 lb 175 lb 174 lb 4.8 oz   Weight (kg) 77.111 kg 79.379 kg 79.062 kg     Body mass index is 24.39 kg/m.  General:  Well nourished, well developed, in no acute distress HEENT: normal Neck: no JVD Vascular: No carotid bruits; Distal pulses 2+ bilaterally Cardiac:  normal S1, S2; RRR; no murmur  Lungs:  clear to auscultation bilaterally, no wheezing, rhonchi or rales  Abd: soft, nontender, no hepatomegaly  Ext: no edema Musculoskeletal:  No deformities, BUE and BLE strength normal and equal Skin: warm and dry  Neuro:  CNs 2-12 intact, no focal abnormalities noted Psych:  Normal affect   EKG:  The EKG was personally reviewed and demonstrates:  normal sinus rhythm, no acute ischemic changes, and 1 PVC  Relevant CV Studies: LHC 01/2024: CONCLUSIONS:  - Anterolateral hypokinesis with reduced LV systolic function  - Elevated LVEDP at 30 mm Hg  - Multi-vessel coronary artery disease including 70% mid-RCA stenosis  (non-dominant), 60% mid-LAD stenosis (heavy plaque burden on IVUS), and  60% OM2 stenosis (concentric calcium  and moderate plaque burden on IVUS).  - IVUS showed diffuse moderate calcified atherosclerotic plaque in left  main, LAD and circumflex arteries  - Successful IVUS guided PCI to the mid LAD with a Xience 2.75x8 mm stent  deployed   RECOMMENDATIONS:  - PCI performed (see details below)  - Aggressive secondary prevention  - ASA 81 mg daily indefinitely  - Prasugrel  10 mg daily for at least 12 months.  - CYP2C19 Genotyping. Plavix  genotyping was ordered to determine whether  the patient could be safely switched to Plavix  as an anti-platelet agent  in the future. If Plavix  was not used initially, it is because it is  unknown whether the patient will metabolize Plavix  to the active drug and  clinical studies have shown that patients who are not responders to Plavix   (as identified by CYP2C19 genotyping) have a higher incidence of death,  myocardial infarction and stroke when treated with Plavix   (Cardiovascular  Revascularization Medicine 41 (2022): 115-121). The patient may be  switched to Plavix  in the future if they are a responder because Plavix  is  associated with lower costs and less bleeding risk than the alternatives.   TTE 01/2024: Summary   1. The left ventricle is upper normal in size with mildly increased wall  thickness.   2. The left ventricular systolic function is severely decreased, LVEF is  visually estimated at 30-35%.    3. There is hypokinesis of all apical segments, mid anterior, anterolateral,  anteroseptal, inferolateral and inferoseptal segments. There is thinning of  the LV apex and apical anterior / apical septal segments.    4. No left ventricular thrombus visualized.    5. The right ventricle is normal in size, with low normal systolic function.    6. IVC size and inspiratory change suggest mildly elevated right atrial  pressure. (5-10 mmHg).   Laboratory Data: High Sensitivity Troponin:   Recent Labs  Lab 04/26/24 1758 04/26/24 2019  TROPONINIHS 48* 57*     Chemistry Recent Labs  Lab 04/26/24 1758  04/26/24 1819  NA 137 138  K 3.8 3.8  CL 104 104  CO2 20*  --   GLUCOSE 147* 141*  BUN 22 22  CREATININE 2.31* 2.50*  CALCIUM  8.9  --   MG 1.6*  --   GFRNONAA 29*  --   ANIONGAP 13  --     Recent Labs  Lab 04/26/24 1758  PROT 5.8*  ALBUMIN 3.1*  AST 16  ALT 10  ALKPHOS 45  BILITOT 0.7   Lipids No results for input(s): CHOL, TRIG, HDL, LABVLDL, LDLCALC, CHOLHDL in the last 168 hours.  Hematology Recent Labs  Lab 04/26/24 1758 04/26/24 1819  WBC 9.1  --   RBC 3.60*  --   HGB 10.8* 11.2*  HCT 34.4* 33.0*  MCV 95.6  --   MCH 30.0  --   MCHC 31.4  --   RDW 14.2  --   PLT 224  --    Thyroid  No results for input(s): TSH, FREET4 in the last 168 hours.  BNP Recent Labs  Lab 04/26/24 1809  BNP 131.0*    DDimer No results for input(s): DDIMER in the last 168 hours.  Radiology/Studies:  DG Chest  Portable 1 View Result Date: 04/26/2024 CLINICAL DATA:  Chest pain, history of coronary artery disease EXAM: PORTABLE CHEST 1 VIEW COMPARISON:  02/07/2016 FINDINGS: 2 frontal views of the chest demonstrate an unremarkable cardiac silhouette. Patchy consolidation at the lung bases, favor atelectasis. No effusion or pneumothorax. No acute bony abnormalities. IMPRESSION: 1. Bibasilar hypoventilatory changes. No acute intrathoracic process. Electronically Signed   By: Ozell Daring M.D.   On: 04/26/2024 18:27     Assessment and Plan: NSTEMI Recent PCI to mLAD in the setting of NSTEMI in 01/2024 Patient presents with retrosternal, pressure-like, chest pain with radiation to the left arm, similar to pain associated with MI in 01/2024 but more severe this time.  He was discharged on ASA and prasugrel  following his PCI in 01/2024, bur as per notes from [facility] on 11/11, he was switched to Plavix  due to initiation of apixaban for new AF. As per discussion with the patient and the wife, he's likely only taking aspirin  now with no P2Y12i as he discontinued the other agent(s) 5-7 days ago due to easy bleeding from right hand wounds. No GI bleed or other significant bleed. Troponin 48 =>57. Overall, picture is concerning for NSTEMI.  - S/P ASA 325 mg with EMS. Continue ASA 81 mg daily [has been adherent to that at home] - As mentioned above, patient has not likely been on P2Y12i for the past 5-7 days; given known coronary anatomy on recent cath, unlikely need for CABG (and unlikely to be candidate), and high concern for a stent related event, will reload with Plavix . Had normal genotyping as per notes. - Start heparin  gtt for ACS - Continue atorvastatin  80 mg daily - TTE tomorrow - Check LDL and A1C - NPO after midnight for possible LHC tomorrow  3. Ischemic HFrEF - So signs of HF on exam. Has not needed his PRN Lasix recently.  - For GDMT:      - Continue valsartan 40 mg daily and Jardiance 10 mg daily. Patient  was on metoprolol  50 mg daily but he stopped it due to low BP (reportedly SBP dropped to 70s at home). Will monitor telemetry, if he continues to have high burden of PVC, we could consider 25 mg daily  4. AF - Heparin  gtt   Risk Assessment/Risk Scores:  TIMI Risk Score for Unstable Angina or Non-ST Elevation MI:   The patient's TIMI risk score is 5, which indicates a 26% risk of all cause mortality, new or recurrent myocardial infarction or need for urgent revascularization in the next 14 days.  [place]  Heart Association (NYHA) Functional Class NYHA Class II       For questions or updates, please contact [facility] Please consult [email or web address] for contact info under      Signed, Gillian CHRISTELLA Cass, MD  04/26/2024 10:49 PM

## 2024-04-26 NOTE — ED Triage Notes (Signed)
 Pt BIB [facility] due to chest pain this morning.  4 sublingual nitroglycerin .  Two months ago we had MI; got one stent placed.  20g left AC. 2L nasal cannula baseline for COPD. 81 aspirin  takes once a day. VS CBG 148, BP 128/62, HR 79, SpO2 95% 2L

## 2024-04-27 ENCOUNTER — Observation Stay (HOSPITAL_COMMUNITY)

## 2024-04-27 ENCOUNTER — Other Ambulatory Visit (HOSPITAL_COMMUNITY): Payer: Self-pay

## 2024-04-27 DIAGNOSIS — I10 Essential (primary) hypertension: Secondary | ICD-10-CM | POA: Diagnosis not present

## 2024-04-27 DIAGNOSIS — R0789 Other chest pain: Secondary | ICD-10-CM | POA: Diagnosis not present

## 2024-04-27 DIAGNOSIS — I5022 Chronic systolic (congestive) heart failure: Secondary | ICD-10-CM | POA: Diagnosis not present

## 2024-04-27 DIAGNOSIS — J9611 Chronic respiratory failure with hypoxia: Secondary | ICD-10-CM | POA: Diagnosis not present

## 2024-04-27 DIAGNOSIS — I480 Paroxysmal atrial fibrillation: Secondary | ICD-10-CM | POA: Diagnosis not present

## 2024-04-27 DIAGNOSIS — E785 Hyperlipidemia, unspecified: Secondary | ICD-10-CM

## 2024-04-27 DIAGNOSIS — I25118 Atherosclerotic heart disease of native coronary artery with other forms of angina pectoris: Secondary | ICD-10-CM

## 2024-04-27 DIAGNOSIS — R079 Chest pain, unspecified: Secondary | ICD-10-CM | POA: Diagnosis not present

## 2024-04-27 LAB — ECHOCARDIOGRAM COMPLETE
AR max vel: 1.16 cm2
AV Mean grad: 9.1 mmHg
AV Peak grad: 21.2 mmHg
Ao pk vel: 2.3 m/s
Height: 70 in
S' Lateral: 3.2 cm
Weight: 2582.03 [oz_av]

## 2024-04-27 LAB — GLUCOSE, CAPILLARY
Glucose-Capillary: 108 mg/dL — ABNORMAL HIGH (ref 70–99)
Glucose-Capillary: 95 mg/dL (ref 70–99)
Glucose-Capillary: 95 mg/dL (ref 70–99)

## 2024-04-27 LAB — CBC
HCT: 34.8 % — ABNORMAL LOW (ref 39.0–52.0)
Hemoglobin: 11.4 g/dL — ABNORMAL LOW (ref 13.0–17.0)
MCH: 30.6 pg (ref 26.0–34.0)
MCHC: 32.8 g/dL (ref 30.0–36.0)
MCV: 93.3 fL (ref 80.0–100.0)
Platelets: 231 K/uL (ref 150–400)
RBC: 3.73 MIL/uL — ABNORMAL LOW (ref 4.22–5.81)
RDW: 14.3 % (ref 11.5–15.5)
WBC: 9.5 K/uL (ref 4.0–10.5)
nRBC: 0 % (ref 0.0–0.2)

## 2024-04-27 LAB — HEPARIN LEVEL (UNFRACTIONATED): Heparin Unfractionated: 0.57 [IU]/mL (ref 0.30–0.70)

## 2024-04-27 LAB — BASIC METABOLIC PANEL WITH GFR
Anion gap: 14 (ref 5–15)
BUN: 21 mg/dL (ref 8–23)
CO2: 23 mmol/L (ref 22–32)
Calcium: 8.9 mg/dL (ref 8.9–10.3)
Chloride: 104 mmol/L (ref 98–111)
Creatinine, Ser: 2.21 mg/dL — ABNORMAL HIGH (ref 0.61–1.24)
GFR, Estimated: 31 mL/min — ABNORMAL LOW (ref >60)
Glucose, Bld: 108 mg/dL — ABNORMAL HIGH (ref 70–99)
Potassium: 3.7 mmol/L (ref 3.5–5.1)
Sodium: 141 mmol/L (ref 135–145)

## 2024-04-27 LAB — TROPONIN I (HIGH SENSITIVITY)
Troponin I (High Sensitivity): 63 ng/L — ABNORMAL HIGH (ref <18)
Troponin I (High Sensitivity): 61 ng/L — ABNORMAL HIGH (ref <18)

## 2024-04-27 LAB — MAGNESIUM: Magnesium: 2 mg/dL (ref 1.7–2.4)

## 2024-04-27 LAB — CREATININE, SERUM
Creatinine, Ser: 2.3 mg/dL — ABNORMAL HIGH (ref 0.61–1.24)
GFR, Estimated: 29 mL/min — ABNORMAL LOW (ref >60)

## 2024-04-27 LAB — APTT: aPTT: 124 s — ABNORMAL HIGH (ref 24–36)

## 2024-04-27 LAB — HEMOGLOBIN A1C
Hgb A1c MFr Bld: 6.3 % — ABNORMAL HIGH (ref 4.8–5.6)
Mean Plasma Glucose: 134 mg/dL

## 2024-04-27 MED ORDER — ALBUTEROL SULFATE (2.5 MG/3ML) 0.083% IN NEBU: 3.0000 mL | INHALATION_SOLUTION | RESPIRATORY_TRACT | Status: DC | PRN

## 2024-04-27 MED ORDER — TRAZODONE HCL 100 MG PO TABS
100.0000 mg | ORAL_TABLET | Freq: Every day | ORAL | Status: DC
Start: 2024-04-27 — End: 2024-04-27

## 2024-04-27 MED ORDER — APIXABAN 5 MG PO TABS
5.0000 mg | ORAL_TABLET | Freq: Two times a day (BID) | ORAL | Status: DC
  Administered 2024-04-27: 5 mg via ORAL
  Filled 2024-04-27: qty 1

## 2024-04-27 MED ORDER — FLUTICASONE FUROATE-VILANTEROL 200-25 MCG/ACT IN AEPB
1.0000 | INHALATION_SPRAY | Freq: Every day | RESPIRATORY_TRACT | Status: DC
  Administered 2024-04-27: 1 via RESPIRATORY_TRACT
  Filled 2024-04-27: qty 28

## 2024-04-27 MED ORDER — TAMSULOSIN HCL 0.4 MG PO CAPS
0.4000 mg | ORAL_CAPSULE | Freq: Every morning | ORAL | Status: DC
  Administered 2024-04-27: 0.4 mg via ORAL
  Filled 2024-04-27: qty 1

## 2024-04-27 MED ORDER — IPRATROPIUM-ALBUTEROL 0.5-2.5 (3) MG/3ML IN SOLN
3.0000 mL | Freq: Four times a day (QID) | RESPIRATORY_TRACT | Status: DC
  Administered 2024-04-27: 3 mL via RESPIRATORY_TRACT
  Filled 2024-04-27: qty 3

## 2024-04-27 MED ORDER — IPRATROPIUM-ALBUTEROL 0.5-2.5 (3) MG/3ML IN SOLN
3.0000 mL | Freq: Four times a day (QID) | RESPIRATORY_TRACT | Status: DC
Start: 2024-04-27 — End: 2024-04-27
  Administered 2024-04-27: 3 mL via RESPIRATORY_TRACT
  Filled 2024-04-27: qty 3

## 2024-04-27 MED ORDER — PANTOPRAZOLE SODIUM 40 MG PO TBEC
40.0000 mg | DELAYED_RELEASE_TABLET | Freq: Every day | ORAL | Status: DC
  Administered 2024-04-27: 40 mg via ORAL
  Filled 2024-04-27: qty 1

## 2024-04-27 MED ORDER — FINASTERIDE 5 MG PO TABS
5.0000 mg | ORAL_TABLET | Freq: Every day | ORAL | Status: DC
  Administered 2024-04-27: 5 mg via ORAL
  Filled 2024-04-27: qty 1

## 2024-04-27 NOTE — Discharge Summary (Signed)
 Physician Discharge Summary  Chris Washington FMW:[id number] DOB: 16-May-1951 DOA: 04/26/2024  PCP: Montey Lot, PA-C  Admit date: 04/26/2024 Discharge date: 04/27/2024 Recommendations for Outpatient Follow-up:  Follow up with PCP in 1 weeks-call for appointment Please obtain BMP/CBC in 4-5 days Hold valsartan ad aldactone for 5 days and see PCP  Discharge Dispo: home Discharge Condition: Stable Code Status:   Code Status: Full Code Diet recommendation:  Diet Order             Diet Carb Modified           Diet Carb Modified Fluid consistency: Thin; Room service appropriate? Yes  Diet effective now                    Brief/Interim Summary: Chris Washington is a 73 y.o. male with PMH of   CAD s/p PCI to mLAD in 01/2024 in the setting of NSTEMI, COPD on 2L O2 at home, T2DM, HTN, ischemic HFrEF (LVEF 30-35%), BPH with her last 2 to 3 days initially having periumbilical abdominal pain nausea vomiting that had resolved and later in the day while sitting down started to have retrosternal pressure-like chest pain with radiation to the left arm with diaphoresis.  Patient took 3 sublingual nitroglycerin  with some mild improvement but persisted so came to the ED. He was discharged on aspirin  and prasugrel  following his PCI in 01/2024; as per [facility] notes, he was recently switched from prasugrel  to palvix given initation of apixaban  in the setting of a new diagnosis of AF. As per the patient and his wife, the only blood thinner he's taking now is aspirin  as he stopped the other blood thinner due to easy bleeding from right hand wounds  In the ED: he received aspirin  325 EKG sinus rhythm no acute ischemic changes, troponin elevated from 48-6>63>61. Seen by cardio - Cardiology consulted for continue aspirin  81, Lipitor, Plavix  75, heparin  drip, metoprolol  Seen by cardiology this morning and discussed does not feel it as NSTEMI likely demand ischemia and atypical chest pain given recent PCI with a stent-okay  to continue on Eliquis  and Plavix  and okay for discharge home  Subjective: Seen and examined today No chest pain currently but states he has pain fro his lung and he takes inhalers On 2l [place] and uses home oxygen  Overnight heart rate as low as 37, BP stable on 2 L [place] afebrile, labs with hemoglobin stable 8.4 creatinine 2.3   Discharge Diagnoses:   Atypical Chest pain Elevated flat  troponin- likely demand ischemia and atypical chest pain given recent PCI  CAD with recent PCI to LAD September 25: Patient on aspirin  and prasugrel  back in September but after being diagnosed with A-fib in Nov placed on Eliquis  switched from prasugrel  to Plavix  but patient was taking aspirin  only and also likely Eliquis -, he stopped P2Y12i due to easy bleeding from right hand sutures. Cardiology consulted for continue aspirin  81, Lipitor, Plavix  75, heparin  drip, metoprolol . Per cardio ok to d/c asa since 30 days out PCI no need to check ECHO. Cont eliquis  and plavix  Patient is agreeable for discharge  Ischemic HFrEF: Appears euvolemic.  PTA on Jardiance, Aldactone, Lasix 20 PRN- on hold. continue GDMT as per cardiology. Can resume Aldactone and Lasix slowly at home.  Hypertension: BP remains stable, continue meds as per cardiology as above  AF: Transitioned to DOAC. Monitor on telemetry rate control  Type 2 diabetes: Stable on sliding scale insulin  hold p.o. meds Jardiance.  AKI on  CKD3B: Creat stable  ~2.3 but at baseline ~ 2 in sept- oct, Monitor. Recent Labs    04/26/24 1758 04/26/24 1819 04/27/24 0036 04/27/24 0926  BUN 22 22  --  21  CREATININE 2.31* 2.50* 2.30* 2.21*  CO2 20*  --   --  23  K 3.8 3.8  --  3.7    COPD on home oxygen -chronic hypoixc respiratory  failure: Not in exacerbation. Cont home o2 and nebs .  Anemia of chronic disease: Hemoglobin stable.  Monitor  BPH: Resume Proscar  Flomax   Depression: Mood stable.  DVT prophylaxis: HEPARIN  GTT Code Status:   Code  Status: Full Code Family Communication: plan of care discussed with patient at bedside. Patient status is: Remains hospitalized because of severity of illness Level of care: Telemetry   Dispo: The patient is from: HOME            Anticipated disposition: TBD Objective: Vitals last 24 hrs: Vitals:   04/27/24 0808 04/27/24 0809 04/27/24 0853 04/27/24 1117  BP:   135/75 (!) 119/92  Pulse:   71 69  Resp:   17 17  Temp:   97.9 F (36.6 C) 98 F (36.7 C)  TempSrc:   Oral Oral  SpO2: 96% 98% 92% 94%  Weight:      Height:        Physical Examination: General exam: alert awake, oriented HEENT:Oral mucosa moist, Ear/Nose WNL grossly Respiratory system: Bilaterally clear BS,no use of accessory muscle Cardiovascular system: S1 & S2 +, No JVD. Gastrointestinal system: Abdomen soft,NT,ND, BS+ Nervous System: Alert, awake, moving all extremities,and following commands. Extremities: extremities warm, leg edema neg Skin: Warm, no rashes MSK: Normal muscle bulk,tone, power     Consultation: See note.  Discharge Instructions  Discharge Instructions     Diet Carb Modified   Complete by: As directed    Discharge instructions   Complete by: As directed    Please call call MD or return to ER for similar or worsening recurring problem that brought you to hospital or if any fever,nausea/vomiting,abdominal pain, uncontrolled pain, chest pain,  shortness of breath or any other alarming symptoms.  Please follow-up your doctor as instructed in a week time and call the office for appointment.  Please avoid alcohol, smoking, or any other illicit substance and maintain healthy habits including taking your regular medications as prescribed.  You were cared for by a hospitalist during your hospital stay. If you have any questions about your discharge medications or the care you received while you were in the hospital after you are discharged, you can call the unit and ask to speak with the  hospitalist on call if the hospitalist that took care of you is not available.  Once you are discharged, your primary care physician will handle any further medical issues. Please note that NO REFILLS for any discharge medications will be authorized once you are discharged, as it is imperative that you return to your primary care physician (or establish a relationship with a primary care physician if you do not have one) for your aftercare needs so that they can reassess your need for medications and monitor your lab values   Increase activity slowly   Complete by: As directed    No wound care   Complete by: As directed       Allergies as of 04/27/2024   No Known Allergies      Medication List     PAUSE taking these medications    spironolactone  25 MG tablet Wait to take this until: May 01, 2024 Commonly known as: ALDACTONE Take 25 mg by mouth daily.   valsartan 40 MG tablet Wait to take this until: May 02, 2024 Commonly known as: DIOVAN Take 40 mg by mouth daily.       STOP taking these medications    aspirin  81 MG chewable tablet       TAKE these medications    acetaminophen  500 MG tablet Commonly known as: TYLENOL  Take 1,000 mg by mouth every 6 (six) hours as needed for mild pain (pain score 1-3).   albuterol  108 (90 Base) MCG/ACT inhaler Commonly known as: VENTOLIN  HFA Inhale 2 puffs into the lungs every 4 (four) hours as needed.   apixaban 5 MG Tabs tablet Commonly known as: ELIQUIS Take 5 mg by mouth 2 (two) times daily.   atorvastatin  80 MG tablet Commonly known as: LIPITOR Take 80 mg by mouth daily.   budesonide -formoterol  160-4.5 MCG/ACT inhaler Commonly known as: SYMBICORT  Inhale 2 puffs into the lungs 2 (two) times daily.   clopidogrel  75 MG tablet Commonly known as: PLAVIX  Take 75 mg by mouth daily.   empagliflozin 10 MG Tabs tablet Commonly known as: JARDIANCE Take 10 mg by mouth daily.   finasteride  5 MG tablet Commonly  known as: PROSCAR  Take 5 mg by mouth daily.   furosemide 20 MG tablet Commonly known as: LASIX Take 20 mg by mouth daily as needed for fluid.   ipratropium-albuterol  0.5-2.5 (3) MG/3ML Soln Commonly known as: DUONEB Take 3 mLs by nebulization every 6 (six) hours.   metoprolol  succinate 50 MG 24 hr tablet Commonly known as: TOPROL -XL Take 25 mg by mouth daily.   naloxone  4 MG/0.1ML Liqd nasal spray kit Commonly known as: NARCAN  Place 4 mg into the nose as needed.   nitroGLYCERIN  0.4 MG SL tablet Commonly known as: NITROSTAT  Place 0.4 mg under the tongue.   ondansetron  4 MG disintegrating tablet Commonly known as: ZOFRAN -ODT Take 4 mg by mouth 3 (three) times daily as needed.   oxyCODONE -acetaminophen  10-325 MG tablet Commonly known as: PERCOCET Take 1 tablet by mouth in the morning, at noon, in the evening, and at bedtime.   pantoprazole  40 MG tablet Commonly known as: PROTONIX  Take 40 mg by mouth daily.   tamsulosin  0.4 MG Caps capsule Commonly known as: FLOMAX  Take 1 capsule (0.4 mg total) by mouth every morning. What changed: when to take this   traZODone  100 MG tablet Commonly known as: DESYREL  Take 100 mg by mouth at bedtime.        Follow-up Information     Montey Lot, PA-C Follow up in 1 week(s).   Specialty: Physician Assistant Contact information: [address] [place] [address] [phone]                No Known Allergies  The results of significant diagnostics from this hospitalization (including imaging, microbiology, ancillary and laboratory) are listed below for reference.    Microbiology: No results found for this or any previous visit (from the past 240 hours).  Procedures/Studies: DG Chest Portable 1 View Result Date: 04/26/2024 CLINICAL DATA:  Chest pain, history of coronary artery disease EXAM: PORTABLE CHEST 1 VIEW COMPARISON:  02/07/2016 FINDINGS: 2 frontal views of the chest demonstrate an unremarkable cardiac  silhouette. Patchy consolidation at the lung bases, favor atelectasis. No effusion or pneumothorax. No acute bony abnormalities. IMPRESSION: 1. Bibasilar hypoventilatory changes. No acute intrathoracic process. Electronically Signed   By: Ozell  Delores M.D.   On: 04/26/2024 18:27    Labs: BNP (last 3 results) Recent Labs    04/26/24 1809  BNP 131.0*   Basic Metabolic Panel: Recent Labs  Lab 04/26/24 1758 04/26/24 1819 04/27/24 0036 04/27/24 0926  NA 137 138  --  141  K 3.8 3.8  --  3.7  CL 104 104  --  104  CO2 20*  --   --  23  GLUCOSE 147* 141*  --  108*  BUN 22 22  --  21  CREATININE 2.31* 2.50* 2.30* 2.21*  CALCIUM  8.9  --   --  8.9  MG 1.6*  --   --  2.0   Liver Function Tests: Recent Labs  Lab 04/26/24 1758  AST 16  ALT 10  ALKPHOS 45  BILITOT 0.7  PROT 5.8*  ALBUMIN 3.1*   No results for input(s): LIPASE, AMYLASE in the last 168 hours. No results for input(s): AMMONIA in the last 168 hours. CBC: Recent Labs  Lab 04/26/24 1758 04/26/24 1819 04/27/24 0036  WBC 9.1  --  9.5  NEUTROABS 6.2  --   --   HGB 10.8* 11.2* 11.4*  HCT 34.4* 33.0* 34.8*  MCV 95.6  --  93.3  PLT 224  --  231   CBG: Recent Labs  Lab 04/27/24 0006 04/27/24 0634 04/27/24 1117  GLUCAP 95 95 108*   Hgb A1c No results for input(s): HGBA1C in the last 72 hours. Anemia work up No results for input(s): VITAMINB12, FOLATE, FERRITIN, TIBC, IRON, RETICCTPCT in the last 72 hours. Cardiac Enzymes: No results for input(s): CKTOTAL, CKMB, CKMBINDEX, TROPONINI in the last 168 hours. BNP: Invalid input(s): POCBNP D-Dimer No results for input(s): DDIMER in the last 72 hours. Lipid Profile No results for input(s): CHOL, HDL, LDLCALC, TRIG, CHOLHDL, LDLDIRECT in the last 72 hours. Thyroid  function studies No results for input(s): TSH, T4TOTAL, T3FREE, THYROIDAB in the last 72 hours.  Invalid input(s): FREET3 Urinalysis     Component Value Date/Time   COLORURINE YELLOW 04/26/2024 1944   APPEARANCEUR CLEAR 04/26/2024 1944   LABSPEC 1.028 04/26/2024 1944   PHURINE 5.0 04/26/2024 1944   GLUCOSEU >=500 (A) 04/26/2024 1944   HGBUR NEGATIVE 04/26/2024 1944   BILIRUBINUR NEGATIVE 04/26/2024 1944   KETONESUR NEGATIVE 04/26/2024 1944   PROTEINUR 30 (A) 04/26/2024 1944   UROBILINOGEN 0.2 12/28/2012 2232   NITRITE NEGATIVE 04/26/2024 1944   LEUKOCYTESUR NEGATIVE 04/26/2024 1944   Sepsis Labs Recent Labs  Lab 04/26/24 1758 04/27/24 0036  WBC 9.1 9.5   Microbiology No results found for this or any previous visit (from the past 240 hours).  Time coordinating discharge: 25 minutes  SIGNED: Mennie LAMY, MD  [facility] 04/27/2024, 11:45 AM  If 7PM-7AM, please contact night-coverage [email or web address]

## 2024-04-27 NOTE — Hospital Course (Addendum)
 Chris Washington is a 73 y.o. male with PMH of   CAD s/p PCI to mLAD in 01/2024 in the setting of NSTEMI, COPD on 2L O2 at home, T2DM, HTN, ischemic HFrEF (LVEF 30-35%), BPH with her last 2 to 3 days initially having periumbilical abdominal pain nausea vomiting that had resolved and later in the day while sitting down started to have retrosternal pressure-like chest pain with radiation to the left arm with diaphoresis.  Patient took 3 sublingual nitroglycerin  with some mild improvement but persisted so came to the ED. He was discharged on aspirin  and prasugrel  following his PCI in 01/2024; as per [facility] notes, he was recently switched from prasugrel  to palvix given initation of apixaban in the setting of a new diagnosis of AF. As per the patient and his wife, the only blood thinner he's taking now is aspirin  as he stopped the other blood thinner due to easy bleeding from right hand wounds  In the ED: he received aspirin  325 EKG sinus rhythm no acute ischemic changes, troponin elevated from 48-6>63>61. Seen by cardio - Cardiology consulted for continue aspirin  81, Lipitor, Plavix  75, heparin  drip, metoprolol  Seen by cardiology this morning and discussed does not feel it as NSTEMI likely demand ischemia and atypical chest pain given recent PCI with a stent-okay to continue on Eliquis and Plavix  and okay for discharge home  Subjective: Seen and examined today No chest pain currently but states he has pain fro his lung and he takes inhalers On 2l [place] and uses home oxygen  Overnight heart rate as low as 37, BP stable on 2 L [place] afebrile, labs with hemoglobin stable 8.4 creatinine 2.3   Discharge Diagnoses:   Atypical Chest pain Elevated flat  troponin- likely demand ischemia and atypical chest pain given recent PCI  CAD with recent PCI to LAD September 25: Patient on aspirin  and prasugrel  back in September but after being diagnosed with A-fib in Nov placed on Eliquis switched from prasugrel  to Plavix  but patient  was taking aspirin  only and also likely Eliquis-, he stopped P2Y12i due to easy bleeding from right hand sutures. Cardiology consulted for continue aspirin  81, Lipitor, Plavix  75, heparin  drip, metoprolol . Per cardio ok to d/c asa since 30 days out PCI no need to check ECHO. Cont eliquis and plavix  Patient is agreeable for discharge  Ischemic HFrEF: Appears euvolemic.  PTA on Jardiance, Aldactone, Lasix 20 PRN- on hold. continue GDMT as per cardiology. Can resume Aldactone and Lasix slowly at home.  Hypertension: BP remains stable, continue meds as per cardiology as above  AF: Transitioned to DOAC. Monitor on telemetry rate control  Type 2 diabetes: Stable on sliding scale insulin  hold p.o. meds Jardiance.  AKI on CKD3B: Creat stable  ~2.3 but at baseline ~ 2 in sept- oct, Monitor. Recent Labs    04/26/24 1758 04/26/24 1819 04/27/24 0036 04/27/24 0926  BUN 22 22  --  21  CREATININE 2.31* 2.50* 2.30* 2.21*  CO2 20*  --   --  23  K 3.8 3.8  --  3.7    COPD on home oxygen -chronic hypoixc respiratory  failure: Not in exacerbation. Cont home o2 and nebs .  Anemia of chronic disease: Hemoglobin stable.  Monitor  BPH: Resume Proscar  Flomax   Depression: Mood stable.  DVT prophylaxis: HEPARIN  GTT Code Status:   Code Status: Full Code Family Communication: plan of care discussed with patient at bedside. Patient status is: Remains hospitalized because of severity of illness Level of care: Telemetry  Dispo: The patient is from: HOME            Anticipated disposition: TBD Objective: Vitals last 24 hrs: Vitals:   04/27/24 0808 04/27/24 0809 04/27/24 0853 04/27/24 1117  BP:   135/75 (!) 119/92  Pulse:   71 69  Resp:   17 17  Temp:   97.9 F (36.6 C) 98 F (36.7 C)  TempSrc:   Oral Oral  SpO2: 96% 98% 92% 94%  Weight:      Height:        Physical Examination: General exam: alert awake, oriented HEENT:Oral mucosa moist, Ear/Nose WNL grossly Respiratory system:  Bilaterally clear BS,no use of accessory muscle Cardiovascular system: S1 & S2 +, No JVD. Gastrointestinal system: Abdomen soft,NT,ND, BS+ Nervous System: Alert, awake, moving all extremities,and following commands. Extremities: extremities warm, leg edema neg Skin: Warm, no rashes MSK: Normal muscle bulk,tone, power

## 2024-04-27 NOTE — TOC CM/SW Note (Signed)
 Transition of Care [facility]) - Inpatient Brief Assessment   Patient Details  Name: RAYKWON HOBBS MRN: [id number] Date of Birth: [birth year]  Transition of Care [facility]) CM/SW Contact:    Waddell Barnie Rama, RN Phone Number: 04/27/2024, 12:17 PM   Clinical Narrative: From home with spouse, has PCP and insurance on file, states has [facility] services in place  with [facility] for [facility], HHPT and will continue with them, do not need any new orders since he is under observation status at this time.  States family member  (wife) will transport them home at [employer] and family is support system.  Pta self ambulatory.   NCM confirmed with [place] with [facility] he is active, do not need any new orders.      Transition of Care Asessment: Insurance and Status: Insurance coverage has been reviewed Patient has primary care physician: Yes Home environment has been reviewed: home with wife Prior level of function:: ambulatory Prior/Current Home Services: Current home services (active with [facility] for [facility], HHPT) Social Drivers of Health Review: SDOH reviewed no interventions necessary Readmission risk has been reviewed: Yes Transition of care needs: transition of care needs identified, TOC will continue to follow

## 2024-04-27 NOTE — Progress Notes (Signed)
 PHARMACY - ANTICOAGULATION CONSULT NOTE  Pharmacy Consult for Heparin  (Holding Apixaban) Indication: chest pain/ACS, history of afib  No Known Allergies  Patient Measurements: Height: (P) 5' 10 (177.8 cm) Weight: (P) 73.2 kg (161 lb 6 oz) IBW/kg (Calculated) : (P) 73 HEPARIN  DW (KG): (P) 73.2  Vital Signs: Temp: 98.2 F (36.8 C) (11/28 0437) Temp Source: Oral (11/28 0437) BP: 122/74 (11/28 0437) Pulse Rate: 73 (11/28 0437)  Labs: Recent Labs    04/26/24 1758 04/26/24 1819 04/26/24 2019 04/26/24 2257 04/27/24 0036 04/27/24 0332 04/27/24 0657  HGB 10.8* 11.2*  --   --  11.4*  --   --   HCT 34.4* 33.0*  --   --  34.8*  --   --   PLT 224  --   --   --  231  --   --   APTT  --   --   --   --   --   --  124*  HEPARINUNFRC  --   --   --   --   --   --  0.57  CREATININE 2.31* 2.50*  --   --  2.30*  --   --   TROPONINIHS 48*  --    < > 63* 63* 61*  --    < > = values in this interval not displayed.    Estimated Creatinine Clearance: 29.5 mL/min (A) (by C-G formula based on SCr of 2.3 mg/dL (H)).   Medical History: Past Medical History:  Diagnosis Date   Arthritis    takes gabapentin  for chronic knee pain   Basal cell carcinoma of skin of face    left   CKD (chronic kidney disease)    COPD (chronic obstructive pulmonary disease) (HCC)    Depression    Diabetes mellitus without complication (HCC)    Type II   DOE (dyspnea on exertion)    Dysrhythmia    PVCs, saw cardiologist Oneil Parchment, MD in 2017   Enlarged prostate    Hard of hearing    History of hiatal hernia 2019   History of kidney stones    History of pneumonia    Hypercholesteremia    Hypertension    Nocturia    controlled with medicines    Assessment: 73 y/o M presents to the ED with chest pain, on apixaban PTA for atrial fibrillation. Per the cardiology note, it looks like he may have recently stopped apixaban, however, he told the RN that his last dose of apixaban was 11/27 at 0900. Either  way, ok to start heparin  now. Will start off checking aPTT in addition to heparin  level. The first heparin  level will give us  some guidance about apixaban compliance. Above labs reviewed.   11/28 AM update: HL 0.57 aPTT 124 seconds Hgb 11.4 PLT wnl No signs of bleeding or pauses w/ gtt per nursing  Goal of Therapy:  Heparin  level 0.3-0.7 units/ml aPTT 66-102 seconds Monitor platelets by anticoagulation protocol: Yes   Plan:  Decrease heparin  drip 850 units/hr aPTT in 8 hours Daily CBC/Heparin  level/aPTT Monitor for bleeding  Benedetta Heath BS, PharmD, BCPS Clinical Pharmacist 04/27/2024 7:45 AM  Contact: [phone] after 3 PM

## 2024-04-27 NOTE — Care Management Obs Status (Signed)
 MEDICARE OBSERVATION STATUS NOTIFICATION   Patient Details  Name: Chris Washington MRN: [id number] Date of Birth: 21-Sep-1950   Medicare Observation Status Notification Given:  Yes    [name] 04/27/2024, 10:52 AM

## 2024-04-27 NOTE — Progress Notes (Addendum)
 Rounding Note   Patient Name: Chris Washington Date of Encounter: 04/27/2024  [facility] Cardiologist: [facility]  Subjective Patient admitted overnight for chest pain. Pain was similar to prior angina but more severe. Resolved with 3 doses of sublingual Nitroglycerin . Currently chest pain free. He has chronic shortness of breath due to his COPD but this is currently at baseline.  Scheduled Meds:  aspirin  EC  81 mg Oral Daily   atorvastatin   80 mg Oral Daily   clopidogrel   75 mg Oral Daily   fluticasone furoate-vilanterol  1 puff Inhalation Daily   insulin  aspart  0-5 Units Subcutaneous QHS   insulin  aspart  0-9 Units Subcutaneous TID WC   ipratropium-albuterol   3 mL Nebulization Q6H   metoprolol  succinate  50 mg Oral Daily   Continuous Infusions:  heparin  1,000 Units/hr (04/27/24 0451)   PRN Meds: acetaminophen , albuterol , nitroGLYCERIN , ondansetron  (ZOFRAN ) IV, oxyCODONE -acetaminophen  **AND** oxyCODONE    Vital Signs  Vitals:   04/26/24 2332 04/26/24 2340 04/27/24 0054 04/27/24 0437  BP:  (!) 151/82  122/74  Pulse:  88  73  Resp:  20  20  Temp:  98.4 F (36.9 C)  98.2 F (36.8 C)  TempSrc:  Oral  Oral  SpO2:  100% 96% 92%  Weight: (P) 73.2 kg     Height: (P) 5' 10 (1.778 m)       Intake/Output Summary (Last 24 hours) at 04/27/2024 0639 Last data filed at 04/27/2024 0451 Gross per 24 hour  Intake 696.24 ml  Output --  Net 696.24 ml      04/26/2024   11:32 PM 04/26/2024    5:52 PM 03/23/2021    6:01 AM  Last 3 Weights  Weight (lbs) 161 lb 6 oz 170 lb 175 lb  Weight (kg) 73.2 kg 77.111 kg 79.379 kg      Telemetry Paroxysmal rate controlled atrial flutter vs coarse atrial fibrillation but currently in sinus rhythm with frequent PVCs (sometimes in bigeminy). Short runs of NSVT also noted (longest runs 3 beats).  - Personally Reviewed  ECG  No new ECG tracing this morning.  - Personally Reviewed  Physical Exam  GEN: No acute distress.    Neck: No JVD. Cardiac: Irregular rhythm with normal rate. Difficult to appreciate heart sounds over course lung sounds.  Respiratory: No increased work of breathing. Diffuse expiratory wheezes/ rhonchi noted. No rales.  MS: No lower extremity edema. No deformity. Neuro:  No focal deficits. Psych: Normal affect. Responds appropriately.  Labs High Sensitivity Troponin:   Recent Labs  Lab 04/26/24 1758 04/26/24 2019 04/26/24 2257 04/27/24 0036 04/27/24 0332  TROPONINIHS 48* 57* 63* 63* 61*     Chemistry Recent Labs  Lab 04/26/24 1758 04/26/24 1819 04/27/24 0036  NA 137 138  --   K 3.8 3.8  --   CL 104 104  --   CO2 20*  --   --   GLUCOSE 147* 141*  --   BUN 22 22  --   CREATININE 2.31* 2.50* 2.30*  CALCIUM  8.9  --   --   MG 1.6*  --   --   PROT 5.8*  --   --   ALBUMIN 3.1*  --   --   AST 16  --   --   ALT 10  --   --   ALKPHOS 45  --   --   BILITOT 0.7  --   --   GFRNONAA 29*  --  29*  ANIONGAP 13  --   --     Lipids No results for input(s): CHOL, TRIG, HDL, LABVLDL, LDLCALC, CHOLHDL in the last 168 hours.  Hematology Recent Labs  Lab 04/26/24 1758 04/26/24 1819 04/27/24 0036  WBC 9.1  --  9.5  RBC 3.60*  --  3.73*  HGB 10.8* 11.2* 11.4*  HCT 34.4* 33.0* 34.8*  MCV 95.6  --  93.3  MCH 30.0  --  30.6  MCHC 31.4  --  32.8  RDW 14.2  --  14.3  PLT 224  --  231   Thyroid  No results for input(s): TSH, FREET4 in the last 168 hours.  BNP Recent Labs  Lab 04/26/24 1809  BNP 131.0*    DDimer No results for input(s): DDIMER in the last 168 hours.   Radiology  DG Chest Portable 1 View Result Date: 04/26/2024 CLINICAL DATA:  Chest pain, history of coronary artery disease EXAM: PORTABLE CHEST 1 VIEW COMPARISON:  02/07/2016 FINDINGS: 2 frontal views of the chest demonstrate an unremarkable cardiac silhouette. Patchy consolidation at the lung bases, favor atelectasis. No effusion or pneumothorax. No acute bony abnormalities. IMPRESSION: 1.  Bibasilar hypoventilatory changes. No acute intrathoracic process. Electronically Signed   By: Ozell Daring M.D.   On: 04/26/2024 18:27    Cardiac Studies  Cardiac Catheterization Mar 05, 2024: Conclusions: - Anterolateral hypokinesis with reduced LV systolic function  - Elevated LVEDP at 30 mm Hg  - Multi-vessel coronary artery disease including 70% mid-RCA stenosis  (non-dominant), 60% mid-LAD stenosis (heavy plaque burden on IVUS), and  60% OM2 stenosis (concentric calcium  and moderate plaque burden on IVUS).  - IVUS showed diffuse moderate calcified atherosclerotic plaque in left  main, LAD and circumflex arteries  - Successful IVUS guided PCI to the mid LAD with a Xience 2.75x8 mm stent  deployed  _______________  Echocardiogram Mar 05, 2024: Summary:   1. The left ventricle is upper normal in size with mildly increased wall  thickness.   2. The left ventricular systolic function is severely decreased, LVEF is  visually estimated at 30-35%.    3. There is hypokinesis of all apical segments, mid anterior, anterolateral,  anteroseptal, inferolateral and inferoseptal segments. There is thinning of  the LV apex and apical anterior / apical septal segments.    4. No left ventricular thrombus visualized.    5. The right ventricle is normal in size, with low normal systolic function.    6. IVC size and inspiratory change suggest mildly elevated right atrial  pressure. (5-10 mmHg).    Patient Profile   73 y.o. male with a history of CAD with recent NSTEMI in 01/2024 s/p DES to mid LAD at [facility], ischemic cardiomyopathy/ chronic HFrEF with a EF of 30-35%, paroxysmal atrial fibrillation on Eliquis, COPD, hypertension, hyperlipidemia, type 2 diabetes mellitus, CKD stage IIIb, and prior tobacco use who presented on 04/26/2024 for chest pain similar to prior angina (in setting of noncompliance with Plavix ) and was found to have minimally elevated troponin.  Assessment & Plan   Chest  Pain Elevated Troponin CAD Patient was recently admitted for an NSTEMI in 01/2024 at [facility].  LHC at that time showed multivessel disease with 60% stenosis of mid LAD (heavy plaque burden on IVUS), 60% stenosis of OM 2 (moderate plaque burden on IVUS), and 70% stenosis of mid RCA.  He underwent successful IVUS guided PCI with DES to the mid LAD.  He now presents with recurrent chest pain with radiation down left arm that feels similar to  his prior angina but more severe.  He has not been taking his Plavix  due to bleeding from his hand (after scratching it on something).  EKG shows normal sinus rhythm with 1 PVC but no acute ischemic changes.  High sensitive troponin only minimally elevated and flat peaking at 48 >> 57 >> 63 >> 63 >> 62.  - Currently chest pain free. Pain resolved with 3 doses of sublingual Nitroglycerin  that patient took prior to arrival. - Echo pending. - Continue IV Heparinf or now. - He was reloaded on Plavix  on admission. Currently on Aspirin  81mg  daily and Plavix  75mg  daily. Can likely stop Aspirin  now that he is >1 month out from PCI and is on Eliquis at home. - Continue Lipitor 80mg  daily. - Troponin elevation is not consistent with ACS. Hesitant to re-cath this patient given renal function. I suspect he would have EKG changes and a markedly elevated troponin if newly placed LAD stent had reoccluded. Will discuss with MD.  Chronic HFrEF Ischemic Cardiomyopathy Echo in 01/2024 during admission for NSTEMI showed LVEF of 30-35% with hypokinesis of all apical segments, mid anterior, anterolateral, anteroseptal, inferolateral, and inferoseptal segments and thinnin gof the LV apex and apical anterior/ apical segments. BNP 131 on admission. Chest x-ray showed no acute findings.  - Euvolemic on exam. - Home GDMT: Valsartan 40mg  daily, Toprol -XL 50mg  daily, Spironolactone 25mg  daily, Jardiance 10mg  daily.  - Continue Toprol -XL 50mg  daily.  - Will continue to hold Valsartan, Spironolactone,  and Jardiance given renal function.  Paroxysmal Atrial Fibrillation Recent outpatient monitor in 02/2024 showed 4% atrial fibrillation burden. - Telemetry shows rate controlled paroxysmal atrial flutter vs course atrial fibrillation but currently in sinus rhythm. - Continue Toprol -XL 50mg  daily.  - On Eliquis at home (although sounds like he was not taking this) but this is currently on hold and patient is on IV Heparin .   Hypertension BP elevated some overnight but well controlled this morning. - Continue Toprol -XL as above.  Hyperlipidemia LDL 102 in 01/2024.  - Continue Lipitor 80mg  daily.  - Will repeat fasting lipid panel.   CKD Stage IIIb Creatinine 2.31 >> 2.50 on admission. Baseline around 2.0.  - Creatinine stable at 2.30 this morning. - Continue to monitor closely.   Hypomagnesemia Magnesium  1.6 on admission. Repleted.  - Will recheck today.   Otherwise, management per primary team: - COPD - Type 2 diabetes mellitus - Chronic normocytic anemia: hemoglobin stable - Depression  For questions or updates, please contact [facility] Please consult [email or web address] for contact info under       Signed, Callie E Goodrich, PA-C  04/27/2024, 6:39 AM     Personally seen and examined. Agree with above.  73 year old with with chest pain, LAD stent placement at [facility] in September 2025, paroxysmal atrial fibrillation, chronic kidney disease stage IIIb, chronic systolic heart failure with ejection fraction 30 to 35% in September 2025, here with chest pain.  Troponin flat ranging 48-63.  EKG nonischemic.  Comfortable laying flat in bed, expiratory wheeze heard bilaterally, regular rate and rhythm with frequent ectopy, no significant edema and no distress.  Angina/CAD/LAD stent/prior non-STEMI 01/2024 -Pain began in the abdominal region periumbilical then subsided to chest with recurrent pain radiating down left arm.  Currently pain-free. -Minimally elevated and flat  troponin not compatible with acute coronary syndrome. - Clinical data also not compatible with stent thrombosis. - Continue with medical management, Plavix  has been reloaded.  I would not advise cardiac catheterization especially in light of renal  function, creatinine 2.5 with baseline 2.0. -Continue with high intensity statin atorvastatin  80 mg daily -Currently on IV heparin .  We can transition over to Eliquis. -Agree with stopping aspirin  since greater than 30 days post PCI. -Ate some ham and then had his chest pain.  Question if this is in some part GI related.  Ischemic cardiomyopathy/chronic systolic heart failure - EF 30 to 35% in September. -On goal-directed medical therapy with low-dose valsartan 40, Toprol  50, spironolactone 25, Jardiance 10. -Currently holding valsartan spironolactone and Jardiance with slightly worsening renal function.  GFR 29.  Would not be opposed to reinitiating these medications.  Has been on them for quite some time with creatinines in the 2.0 range.  Paroxysmal atrial fibrillation - 4% atrial fibrillation noted on recent Zio.  Recommended that he take apixaban.  Transitioned to Plavix .  At home looks like he was only taking aspirin  over the last 2 weeks.  Frequent PVCs - Noted on telemetry.  Chronic.  Feeling better.  I am okay with discharge.  Continued follow-up with [facility] cardiology. Will sign off.

## 2024-04-27 NOTE — TOC Transition Note (Signed)
 Transition of Care [facility]) - Discharge Note   Patient Details  Name: Chris Washington MRN: [id number] Date of Birth: 23-Jun-1950  Transition of Care [facility]) CM/SW Contact:  Waddell Barnie Rama, RN Phone Number: 04/27/2024, 12:19 PM   Clinical Narrative:    For dc today, NCM informed Cheryl with [facility].  Wife will transport patient home.         Patient Goals and CMS Choice            Discharge Placement                       Discharge Plan and Services Additional resources added to the After Visit Summary for                                       Social Drivers of Health (SDOH) Interventions SDOH Screenings   Food Insecurity: No Food Insecurity (04/27/2024)  Housing: Low Risk  (04/27/2024)  Transportation Needs: No Transportation Needs (04/27/2024)  Utilities: Not At Risk (04/27/2024)  Financial Resource Strain: Low Risk (02/20/2024)   Received from [facility]  Physical Activity: Sufficiently Active (10/28/2022)   Received from [facility]  Social Connections: Moderately Isolated (04/27/2024)  Stress: No Stress Concern Present (10/28/2022)   Received from [facility]  Tobacco Use: Medium Risk (04/26/2024)  Health Literacy: Low Risk (10/28/2022)   Received from [facility]     Readmission Risk Interventions     No data to display

## 2024-05-02 DIAGNOSIS — G894 Chronic pain syndrome: Secondary | ICD-10-CM | POA: Diagnosis not present

## 2024-05-02 DIAGNOSIS — F1120 Opioid dependence, uncomplicated: Secondary | ICD-10-CM | POA: Diagnosis not present

## 2024-05-02 DIAGNOSIS — M461 Sacroiliitis, not elsewhere classified: Secondary | ICD-10-CM | POA: Diagnosis not present

## 2024-05-04 DIAGNOSIS — N189 Chronic kidney disease, unspecified: Secondary | ICD-10-CM | POA: Diagnosis not present

## 2024-05-04 DIAGNOSIS — J439 Emphysema, unspecified: Secondary | ICD-10-CM | POA: Diagnosis not present

## 2024-05-04 DIAGNOSIS — Z7409 Other reduced mobility: Secondary | ICD-10-CM | POA: Diagnosis not present

## 2024-05-04 DIAGNOSIS — Z79899 Other long term (current) drug therapy: Secondary | ICD-10-CM | POA: Diagnosis not present

## 2024-05-04 DIAGNOSIS — I2510 Atherosclerotic heart disease of native coronary artery without angina pectoris: Secondary | ICD-10-CM | POA: Diagnosis not present

## 2024-05-04 DIAGNOSIS — Z9981 Dependence on supplemental oxygen: Secondary | ICD-10-CM | POA: Diagnosis not present

## 2024-05-04 DIAGNOSIS — I5020 Unspecified systolic (congestive) heart failure: Secondary | ICD-10-CM | POA: Diagnosis not present

## 2024-05-04 DIAGNOSIS — J9611 Chronic respiratory failure with hypoxia: Secondary | ICD-10-CM | POA: Diagnosis not present

## 2024-05-04 DIAGNOSIS — N1832 Chronic kidney disease, stage 3b: Secondary | ICD-10-CM | POA: Diagnosis not present

## 2024-05-04 DIAGNOSIS — Z9181 History of falling: Secondary | ICD-10-CM | POA: Diagnosis not present

## 2024-05-04 DIAGNOSIS — Z789 Other specified health status: Secondary | ICD-10-CM | POA: Diagnosis not present

## 2024-05-04 DIAGNOSIS — Z1331 Encounter for screening for depression: Secondary | ICD-10-CM | POA: Diagnosis not present

## 2024-05-09 DIAGNOSIS — R0789 Other chest pain: Secondary | ICD-10-CM | POA: Diagnosis not present

## 2024-05-09 DIAGNOSIS — Z7901 Long term (current) use of anticoagulants: Secondary | ICD-10-CM | POA: Diagnosis not present

## 2024-05-09 DIAGNOSIS — I129 Hypertensive chronic kidney disease with stage 1 through stage 4 chronic kidney disease, or unspecified chronic kidney disease: Secondary | ICD-10-CM | POA: Diagnosis not present

## 2024-05-09 DIAGNOSIS — J449 Chronic obstructive pulmonary disease, unspecified: Secondary | ICD-10-CM | POA: Diagnosis not present

## 2024-05-09 DIAGNOSIS — E1122 Type 2 diabetes mellitus with diabetic chronic kidney disease: Secondary | ICD-10-CM | POA: Diagnosis not present

## 2024-05-09 DIAGNOSIS — N189 Chronic kidney disease, unspecified: Secondary | ICD-10-CM | POA: Diagnosis not present

## 2024-05-09 DIAGNOSIS — Z87891 Personal history of nicotine dependence: Secondary | ICD-10-CM | POA: Diagnosis not present

## 2024-05-09 DIAGNOSIS — I2510 Atherosclerotic heart disease of native coronary artery without angina pectoris: Secondary | ICD-10-CM | POA: Diagnosis not present

## 2024-05-27 DIAGNOSIS — J9611 Chronic respiratory failure with hypoxia: Secondary | ICD-10-CM | POA: Diagnosis not present
